# Patient Record
Sex: Female | Born: 1935 | Race: Black or African American | Hispanic: No | Smoking: Never smoker
Health system: Southern US, Community
[De-identification: ages and names within clinical notes are randomized; demographics above are authoritative.]

## PROBLEM LIST (undated history)

## (undated) DIAGNOSIS — I480 Paroxysmal atrial fibrillation: Secondary | ICD-10-CM

## (undated) DIAGNOSIS — I10 Essential (primary) hypertension: Secondary | ICD-10-CM

## (undated) DIAGNOSIS — M1990 Unspecified osteoarthritis, unspecified site: Secondary | ICD-10-CM

## (undated) DIAGNOSIS — E7800 Pure hypercholesterolemia, unspecified: Secondary | ICD-10-CM

## (undated) DIAGNOSIS — I441 Atrioventricular block, second degree: Secondary | ICD-10-CM

## (undated) DIAGNOSIS — R55 Syncope and collapse: Secondary | ICD-10-CM

## (undated) DIAGNOSIS — Z90710 Acquired absence of both cervix and uterus: Secondary | ICD-10-CM

## (undated) HISTORY — DX: Atrioventricular block, second degree: I44.1

## (undated) HISTORY — DX: Acquired absence of both cervix and uterus: Z90.710

## (undated) HISTORY — DX: Essential (primary) hypertension: I10

## (undated) HISTORY — DX: Paroxysmal atrial fibrillation: I48.0

## (undated) HISTORY — PX: ABDOMINAL HYSTERECTOMY: SHX81

## (undated) HISTORY — DX: Syncope and collapse: R55

## (undated) HISTORY — PX: REPLACEMENT TOTAL KNEE: SUR1224

---

## 1997-10-19 ENCOUNTER — Encounter: Admission: RE | Admit: 1997-10-19 | Discharge: 1997-10-19 | Payer: Self-pay | Admitting: *Deleted

## 1997-10-27 ENCOUNTER — Encounter: Admission: RE | Admit: 1997-10-27 | Discharge: 1997-10-27 | Payer: Self-pay | Admitting: *Deleted

## 1999-09-11 ENCOUNTER — Emergency Department (HOSPITAL_COMMUNITY): Admission: EM | Admit: 1999-09-11 | Discharge: 1999-09-11 | Payer: Self-pay | Admitting: Emergency Medicine

## 1999-10-17 ENCOUNTER — Encounter (HOSPITAL_BASED_OUTPATIENT_CLINIC_OR_DEPARTMENT_OTHER): Payer: Self-pay | Admitting: Internal Medicine

## 1999-10-17 ENCOUNTER — Encounter: Admission: RE | Admit: 1999-10-17 | Discharge: 1999-10-17 | Payer: Self-pay | Admitting: Internal Medicine

## 2000-06-24 ENCOUNTER — Emergency Department (HOSPITAL_COMMUNITY): Admission: EM | Admit: 2000-06-24 | Discharge: 2000-06-24 | Payer: Self-pay | Admitting: *Deleted

## 2000-10-20 ENCOUNTER — Encounter (HOSPITAL_BASED_OUTPATIENT_CLINIC_OR_DEPARTMENT_OTHER): Payer: Self-pay | Admitting: Internal Medicine

## 2000-10-20 ENCOUNTER — Encounter: Admission: RE | Admit: 2000-10-20 | Discharge: 2000-10-20 | Payer: Self-pay | Admitting: Internal Medicine

## 2000-10-23 ENCOUNTER — Emergency Department (HOSPITAL_COMMUNITY): Admission: EM | Admit: 2000-10-23 | Discharge: 2000-10-23 | Payer: Self-pay | Admitting: Emergency Medicine

## 2000-10-24 ENCOUNTER — Encounter: Payer: Self-pay | Admitting: Emergency Medicine

## 2001-03-17 ENCOUNTER — Emergency Department (HOSPITAL_COMMUNITY): Admission: EM | Admit: 2001-03-17 | Discharge: 2001-03-17 | Payer: Self-pay | Admitting: Emergency Medicine

## 2001-10-09 ENCOUNTER — Encounter: Admission: RE | Admit: 2001-10-09 | Discharge: 2001-10-09 | Payer: Self-pay | Admitting: Internal Medicine

## 2001-10-09 ENCOUNTER — Encounter (HOSPITAL_BASED_OUTPATIENT_CLINIC_OR_DEPARTMENT_OTHER): Payer: Self-pay | Admitting: Internal Medicine

## 2001-10-13 ENCOUNTER — Encounter: Admission: RE | Admit: 2001-10-13 | Discharge: 2001-10-13 | Payer: Self-pay | Admitting: Internal Medicine

## 2001-10-13 ENCOUNTER — Encounter (HOSPITAL_BASED_OUTPATIENT_CLINIC_OR_DEPARTMENT_OTHER): Payer: Self-pay | Admitting: Internal Medicine

## 2002-11-16 ENCOUNTER — Encounter (HOSPITAL_BASED_OUTPATIENT_CLINIC_OR_DEPARTMENT_OTHER): Payer: Self-pay | Admitting: Internal Medicine

## 2002-11-16 ENCOUNTER — Encounter: Admission: RE | Admit: 2002-11-16 | Discharge: 2002-11-16 | Payer: Self-pay | Admitting: Internal Medicine

## 2003-11-25 ENCOUNTER — Encounter: Admission: RE | Admit: 2003-11-25 | Discharge: 2003-11-25 | Payer: Self-pay | Admitting: Internal Medicine

## 2004-09-28 ENCOUNTER — Emergency Department (HOSPITAL_COMMUNITY): Admission: EM | Admit: 2004-09-28 | Discharge: 2004-09-28 | Payer: Self-pay | Admitting: Emergency Medicine

## 2004-12-05 ENCOUNTER — Encounter: Admission: RE | Admit: 2004-12-05 | Discharge: 2004-12-05 | Payer: Self-pay | Admitting: Internal Medicine

## 2005-12-06 ENCOUNTER — Encounter: Admission: RE | Admit: 2005-12-06 | Discharge: 2005-12-06 | Payer: Self-pay | Admitting: Internal Medicine

## 2006-12-09 ENCOUNTER — Encounter: Admission: RE | Admit: 2006-12-09 | Discharge: 2006-12-09 | Payer: Self-pay | Admitting: Internal Medicine

## 2007-03-16 ENCOUNTER — Encounter: Payer: Self-pay | Admitting: Gastroenterology

## 2007-03-16 ENCOUNTER — Inpatient Hospital Stay (HOSPITAL_COMMUNITY): Admission: EM | Admit: 2007-03-16 | Discharge: 2007-03-18 | Payer: Self-pay | Admitting: *Deleted

## 2007-03-16 ENCOUNTER — Ambulatory Visit: Payer: Self-pay | Admitting: Internal Medicine

## 2007-04-14 ENCOUNTER — Ambulatory Visit: Payer: Self-pay | Admitting: Internal Medicine

## 2007-10-16 ENCOUNTER — Ambulatory Visit: Payer: Self-pay | Admitting: Internal Medicine

## 2007-12-07 ENCOUNTER — Ambulatory Visit: Payer: Self-pay | Admitting: Internal Medicine

## 2007-12-10 ENCOUNTER — Encounter: Admission: RE | Admit: 2007-12-10 | Discharge: 2007-12-10 | Payer: Self-pay | Admitting: Internal Medicine

## 2008-07-15 DIAGNOSIS — E785 Hyperlipidemia, unspecified: Secondary | ICD-10-CM

## 2008-07-15 DIAGNOSIS — R55 Syncope and collapse: Secondary | ICD-10-CM | POA: Insufficient documentation

## 2008-07-15 DIAGNOSIS — I119 Hypertensive heart disease without heart failure: Secondary | ICD-10-CM

## 2008-07-18 ENCOUNTER — Ambulatory Visit: Payer: Self-pay | Admitting: Internal Medicine

## 2008-11-02 ENCOUNTER — Ambulatory Visit: Payer: Self-pay

## 2008-11-02 ENCOUNTER — Encounter: Payer: Self-pay | Admitting: Internal Medicine

## 2008-12-12 ENCOUNTER — Encounter: Admission: RE | Admit: 2008-12-12 | Discharge: 2008-12-12 | Payer: Self-pay | Admitting: Internal Medicine

## 2009-05-03 ENCOUNTER — Ambulatory Visit: Payer: Self-pay | Admitting: Internal Medicine

## 2009-08-31 ENCOUNTER — Ambulatory Visit: Payer: Self-pay | Admitting: Internal Medicine

## 2009-12-14 ENCOUNTER — Encounter: Admission: RE | Admit: 2009-12-14 | Discharge: 2009-12-14 | Payer: Self-pay | Admitting: Internal Medicine

## 2010-03-20 ENCOUNTER — Ambulatory Visit
Admission: RE | Admit: 2010-03-20 | Discharge: 2010-03-20 | Payer: Self-pay | Source: Home / Self Care | Attending: Internal Medicine | Admitting: Internal Medicine

## 2010-03-26 ENCOUNTER — Encounter: Payer: Self-pay | Admitting: Internal Medicine

## 2010-03-29 NOTE — Assessment & Plan Note (Signed)
Summary: 6 MONTH/DMP   Visit Type:  Follow-up Primary Provider:  Dr.Patterson  CC:  no compaints-Pt does have acold.  History of Present Illness: [name] returns today for followup of syncope.  She is a pleasant elderly woman with a h/o unexplained syncope who is s/p insertion of an ILR over two years ago.  She has had no recurrent syncope since we saw her last in the office.  She denies c/p or sob.  She admits to dietary indiscretion.  Current Medications (verified): 1)  Aspirin Ec 325 Mg Tbec (Aspirin) .... Take One Tablet By Mouth Daily 2)  Oscal 500/200 D-3 500-200 Mg-Unit Tabs (Calcium-Vitamin D) .... 2  Daily 3)  Multivitamins   Tabs (Multiple Vitamin) .Marland Kitchen.. 1 By Mouth Once Daily 4)  Triamterene-Hctz 37.5-25 Mg Caps (Triamterene-Hctz) .Marland Kitchen.. 1 By Mouth Once Daily 5)  Simvastatin 20 Mg Tabs (Simvastatin) .... Take One Tablet By Mouth Daily At Bedtime  Allergies (verified): No Known Drug Allergies  Past History:  Past Medical History: Last updated: 07/15/2008 DYSLIPIDEMIA (ICD-272.4) HYPERTENSION (ICD-401.9) SYNCOPE (ICD-780.2)  Past Surgical History: Last updated: 07/15/2008  Status post knee surgery.   Status post remote hysterectomy  Implantation of implantable loop recorder.   Review of Systems  The patient denies chest pain, syncope, dyspnea on exertion, and peripheral edema.    Vital Signs:  Patient profile:   77 year old female Height:      62 inches Weight:      196 pounds BMI:     35.98 Pulse rate:   49 / minute BP sitting:   161 / 68  (left arm) Cuff size:   large  Vitals Entered By: Burnett Kanaris, CNA (May 03, 2009 10:43 AM)  Physical Exam  General:  Well developed, well nourished, in no acute distress. Head:  normocephalic and atraumatic Eyes:  PERRLA/EOM intact; conjunctiva and lids normal. Mouth:  Teeth, gums and palate normal. Oral mucosa normal. Neck:  Neck supple, no JVD. No masses, thyromegaly or abnormal cervical nodes. Chest Wall:   Well healed ILR incision with a small keloid. Lungs:  Clear bilaterally to auscultation without wheezes, rales, or rhonchi. Heart:  RRR with a soft S4 gallop.  No murmurs. Abdomen:  Bowel sounds positive; abdomen soft and non-tender without masses, organomegaly, or hernias noted. No hepatosplenomegaly. Msk:  Back normal, normal gait. Muscle strength and tone normal. Pulses:  pulses normal in all 4 extremities Extremities:  No clubbing or cyanosis. Neurologic:  Alert and oriented x 3.   EKG  Procedure date:  05/03/2009  Findings:      Sinus bradycardia with rate of:  49.   ILR Following MD Robin Bunting, MD DOI:  03/17/2007 Vendor:  [employer] Jude     Model Number:  [id number]     Serial Number [id number]        Impression & Recommendations:  Problem # 1:  SYNCOPE (ICD-780.2) The etiology is still unclear.  Her loop recorder today demonstrates heart rates in the 30's during the day but she has had no symptoms. I have asked that she pay close attention to her symptoms and let us know if she develops symptomatic bradycardia. Her updated medication list for this problem includes:    Aspirin Ec 325 Mg Tbec (Aspirin) .Marland Kitchen... Take one tablet by mouth daily  Problem # 2:  HYPERTENSION (ICD-401.9) the patient's blood pressure is elevated today but she states that she takes it at home and that is has been well controlled.  I have  asked her to decrease the salt in her diet and to lose weight by eating less. She will continue her meds as below. Her updated medication list for this problem includes:    Aspirin Ec 325 Mg Tbec (Aspirin) .Marland Kitchen... Take one tablet by mouth daily    Triamterene-hctz 37.5-25 Mg Caps (Triamterene-hctz) .Marland Kitchen... 1 by mouth once daily  Patient Instructions: 1)  Your physician recommends that you schedule a follow-up appointment in: 6 MONTHS

## 2010-03-29 NOTE — Cardiovascular Report (Signed)
Summary: Office Visit   Office Visit   Imported By: Roderic Ovens 09/05/2009 09:54:34  _____________________________________________________________________  External Attachment:    Type:   Image     Comment:   External Document

## 2010-03-29 NOTE — Assessment & Plan Note (Signed)
Summary: 6 month return.amber   Visit Type:  Follow-up   Current Medications (verified): 1)  Aspirin Ec 325 Mg Tbec (Aspirin) .... Take One Tablet By Mouth Daily 2)  Oscal 500/200 D-3 500-200 Mg-Unit Tabs (Calcium-Vitamin D) .... 2  Daily 3)  Multivitamins   Tabs (Multiple Vitamin) .Marland Kitchen.. 1 By Mouth Once Daily 4)  Triamterene-Hctz 37.5-25 Mg Caps (Triamterene-Hctz) .Marland Kitchen.. 1 By Mouth Once Daily 5)  Simvastatin 20 Mg Tabs (Simvastatin) .... Take One Tablet By Mouth Daily At Bedtime 6)  Lisinopril-Hydrochlorothiazide 10-12.5 Mg Tabs (Lisinopril-Hydrochlorothiazide) .... Take One Tablet By Mouth Once Daily.  Allergies (verified): No Known Drug Allergies  Vital Signs:  Patient profile:   77 year old female Height:      62 inches Weight:      196 pounds BMI:     35.98 Pulse rate:   44 / minute BP sitting:   104 / 62  (left arm)  Vitals Entered By: Laurance Flatten CMA (March 20, 2010 4:53 PM)    ILR Following MD Lewayne Bunting, MD DOI:  03/17/2007 Vendor:  [employer] Jude     Model Number:  [id number]     Serial Number [id number]       Tachy Episodes:  4     Brady Episodes:  22 ILR Next Due 05/27/2010  Tech Comments:  All tachy episodes were noise, the asystole episodes were signal drop out.  The brady episodes were true episodes with the longes lasting 2:44 minutes. The patient has been asymptomatic.  ROV 3 months clinic. Altha Harm, LPN  March 21, 2010 2:02 PM   Appended Document: 6 month return.amber Mrs. [name] returns today for ILR followup.  She is a pleasant elderly woman with a h/o HTN and bradycardia who is s/p ILR. She denies c/p, sob, or peripheral edema. PE - well appearing elderly woman NAD BP 104/62. P 44 R 16 CV RRR Lungs clear EXT No edema. ILR interogation as noted in previous entry A/P   1. Syncope - She has not had any recurrent symptoms despite her prior history of syncope. Will follow.   2. HTN - Her blood pressure is well controlled. Will follow. A low sodium diet is  recommended.

## 2010-03-29 NOTE — Assessment & Plan Note (Signed)
Summary: 3 month rov/sl   Visit Type:  Follow-up Primary Provider:  Dr.Patterson   History of Present Illness: [name] returns today for followup of syncope.  She is a pleasant elderly woman with a h/o unexplained syncope who is s/p insertion of an ILR over two years ago.  She has had no recurrent syncope since we saw her last in the office.  She denies c/p or sob.  She admits to dietary indiscretion.  Current Medications (verified): 1)  Aspirin Ec 325 Mg Tbec (Aspirin) .... Take One Tablet By Mouth Daily 2)  Oscal 500/200 D-3 500-200 Mg-Unit Tabs (Calcium-Vitamin D) .... 2  Daily 3)  Multivitamins   Tabs (Multiple Vitamin) .Marland Kitchen.. 1 By Mouth Once Daily 4)  Triamterene-Hctz 37.5-25 Mg Caps (Triamterene-Hctz) .Marland Kitchen.. 1 By Mouth Once Daily 5)  Simvastatin 20 Mg Tabs (Simvastatin) .... Take One Tablet By Mouth Daily At Bedtime 6)  Lisinopril-Hydrochlorothiazide 10-12.5 Mg Tabs (Lisinopril-Hydrochlorothiazide) .... Take One Tablet By Mouth Once Daily.  Allergies (verified): No Known Drug Allergies  Past History:  Past Medical History: Last updated: 07/15/2008 DYSLIPIDEMIA (ICD-272.4) HYPERTENSION (ICD-401.9) SYNCOPE (ICD-780.2)  Review of Systems  The patient denies chest pain, syncope, dyspnea on exertion, and peripheral edema.    Vital Signs:  Patient profile:   77 year old female Height:      62 inches Weight:      192 pounds BMI:     35.24 Pulse rate:   51 / minute BP sitting:   162 / 64  (left arm)  Vitals Entered By: Laurance Flatten CMA (August 31, 2009 11:03 AM)  Physical Exam  General:  Well developed, well nourished, in no acute distress. Head:  normocephalic and atraumatic Mouth:  Teeth, gums and palate normal. Oral mucosa normal. Neck:  Neck supple, no JVD. No masses, thyromegaly or abnormal cervical nodes. Chest Wall:  Well healed ILR incision with a small keloid. Lungs:  Clear bilaterally to auscultation without wheezes, rales, or rhonchi. Heart:  RRR with a soft  S4 gallop.  No murmurs. Abdomen:  Bowel sounds positive; abdomen soft and non-tender without masses, organomegaly, or hernias noted. No hepatosplenomegaly. Msk:  Back normal, normal gait. Muscle strength and tone normal. Pulses:  pulses normal in all 4 extremities Extremities:  No clubbing or cyanosis. Neurologic:  Alert and oriented x 3.    ILR Following MD Lewayne Bunting, MD DOI:  03/17/2007 Vendor:  [employer] Jude     Model Number:  [id number]     Serial Number [id number]       Tachy Episodes:  1     Brady Episodes:  72 ILR Next Due 02/26/2010  Tech Comments:  Asymptomatic bradycardia.  Tachy episode is noise.  ROV 6 months clinic. Gypsy Balsam RN BSN  August 31, 2009 11:22 AM   MD Comments:  Agree with above.  Impression & Recommendations:  Problem # 1:  SYNCOPE (ICD-780.2) She has had no recurrent episodes since we last saw her.  She has had bradycardia of unclear clinical significance. Her updated medication list for this problem includes:    Aspirin Ec 325 Mg Tbec (Aspirin) .Marland Kitchen... Take one tablet by mouth daily    Lisinopril-hydrochlorothiazide 10-12.5 Mg Tabs (Lisinopril-hydrochlorothiazide) .Marland Kitchen... Take one tablet by mouth once daily.  Problem # 2:  HYPERTENSION (ICD-401.9) A low sodium diet is recommended.  Continue meds as below. Her updated medication list for this problem includes:    Aspirin Ec 325 Mg Tbec (Aspirin) .Marland Kitchen... Take one tablet by mouth  daily    Triamterene-hctz 37.5-25 Mg Caps (Triamterene-hctz) .Marland Kitchen... 1 by mouth once daily    Lisinopril-hydrochlorothiazide 10-12.5 Mg Tabs (Lisinopril-hydrochlorothiazide) .Marland Kitchen... Take one tablet by mouth once daily.  Patient Instructions: 1)  Your physician wants you to follow-up in:  6 months with Dr Ladona Ridgel. You will receive a reminder letter in the mail two months in advance. If you don't receive a letter, please call our office to schedule the follow-up appointment.

## 2010-07-10 NOTE — Assessment & Plan Note (Signed)
 HEALTHCARE                         ELECTROPHYSIOLOGY OFFICE NOTE   NAME:[name], [name]                    MRN:          [id number]  DATE:10/16/2007                            DOB:          1926-02-28    [name] returns today for followup.  She is a very pleasant 77-year-  old woman with a history of unexplained syncope and hypertension, who  has had no recurrent episodes of syncope since her loop recorder was  placed back in January.  She denies chest pain.  She denies shortness of  breath today.  She admits to being a little heavy.  She also complains  of soreness at her loop recorder insertion site and is now like a small  keloid, that is present.   CURRENT MEDICATIONS:  1. Aspirin 325 a day.  2. Vitamins.  3. Triamterene 25 mg half tablet daily.  4. Simvastatin 20 mg daily.   PHYSICAL EXAMINATION:  GENERAL:  On physical exam, she is pleasant, well-  appearing woman in no distress.  VITAL SIGNS:  Blood pressure was 187/86, the pulse 77 and regular,  respirations were 18.  Weight was 195 pounds.  NECK:  No jugular venous distention.  LUNGS:  Clear bilaterally to auscultation.  No wheezes, rales, or  rhonchi are present.  CARDIOVASCULAR:  Regular rate and rhythm.  Normal S1 and S2.  There are  no murmurs, rubs, or gallops.  The loop recorder insertion site was  healed nicely with a small keloid.  EXTREMITIES:  Demonstrate no edema.   Interrogation of loop recorder demonstrates no significant tachy or  brady episodes.  EKG demonstrates sinus rhythm with sinus arrhythmia.   IMPRESSION:  1. Unexplained syncope.  2. Hypertension.  3. Status post insertion of implantable loop recorder.   DISCUSSION:  [name] is stable.  Her loop recorder is working  normally, but there is no clear evidence of etiology of her syncope.  We  will plan on seeing her back in 9 months.     Doylene Canning. Ladona Ridgel, MD  Electronically Signed    GWT/MedQ   DD: 10/16/2007  DT: 10/17/2007  Job #: [id number]   cc:   Barry Dienes. Eloise Harman, M.D.

## 2010-07-10 NOTE — H&P (Signed)
[name], [name]             ACCOUNT NO.:  [id number]   MEDICAL RECORD NO.:  [id number]          PATIENT TYPE:  EMS   LOCATION:  [facility]                         FACILITY:  [facility]   PHYSICIAN:  Larina Earthly, M.D.        DATE OF BIRTH:  August 25, 1926   DATE OF ADMISSION:  03/15/2007  DATE OF DISCHARGE:                              HISTORY & PHYSICAL   CHIEF COMPLAINT:  Almost blacked out with fall.   HISTORY OF PRESENT ILLNESS:  This is a 77 year old African American  female with a history of hypertension, degenerative joint disease and  hyperlipidemia, who presents with near syncope at approximately 6 p.m.  while at a friend's house.  At the time, she was sitting on a stool and  felt poorly for few seconds to possibly a minute and then slumped over  and hit the floor with her backside.  She does not remember falling, but  does remember hitting the floor at the time.  Surrounding members of her  family thought that she did have some mild tremors, but the patient that  was lucid, answering all questions appropriately and was complaining of  feeling cold.  She had no altered mental status, no focal neurological  deficits, no chest pain, no shortness of breath, no nausea or vomiting  and no precipitating symptoms of no warning, except for the few seconds  prior to the event, she did that complain of some sweats at that time.  She had no bowel or bladder incontinence and no recent bathroom visits  consistent with a vagal episode.  She has had a history of a similar  incident occurring in October 2008 while standing for prolonged periods  of time at a store; EMS was called at that time, but given her normal  vital signs and her nonfocal neurological deficit, she deferred any  further evaluation or management.  Specifically, she had no hypotension.  In the emergency room here, her blood pressure was super-normal with a  systolic blood pressure ranging from 165-185.  EKG reveals sinus rhythm  with a first-degree AV block.  Initial set of cardiac enzymes are  normal.  Exam was benign.  Chest x-ray was unremarkable for acute  disease and the patient is now subsequently admitted for further  evaluation and management of her near-syncopal episode.   REVIEW OF SYSTEMS:  As above.   ALLERGIES:  NO KNOWN DRUG ALLERGIES.   MEDICATIONS:  1. Atenolol 25 mg each day.  2. A statin of unclear name and dosage.  3. Aspirin each day.  4. Os-Cal b.i.d. with vitamin D.   SOCIAL HISTORY:  The patient is a widow, is retired from dietary at  [employer], denies any tobacco or alcohol use.   FAMILY HISTORY:  Significant for diabetes, but no history of early heart  disease, stroke or cancer.   PAST MEDICAL HISTORY:  Significant for:  1. Hypertension.  2. History of knee surgery.  3. Hyperlipidemia.  4. History of remote hysterectomy.   LABORATORY EVALUATION:  Sodium 136, potassium 4.1, BUN 12, creatinine  0.9, glucose 92.  White  blood cell count 5.7, hemoglobin 11.6,  hematocrit 35.3%, platelet count 178,000.  CK-MB less than 1, troponin I  less than 0.05.   CHEST X-RAY:  Reveals cardiomegaly, no edema, bilateral atelectasis at  the bases.   PHYSICAL EXAM:  GENERAL:  We have a pleasant African American female  lying flat in bed in no apparent distress, answering all questions  appropriately, alert and oriented x3.  VITAL SIGNS:  Blood pressure is 188/85, heart rate 56, respirations are  16 nonlabored, temperature 97.3 degrees Fahrenheit, oxygen saturation  100% on room air.  HEENT:  Sclerae are anicteric.  Extraocular movements are intact.  Face  is symmetric.  There are no oropharyngeal lesions.  Tongue is midline.  NECK:  Supple.  There are no carotid bruits.  There is no cervical  lymphadenopathy.  LUNGS:  Clear to auscultation bilaterally.  CARDIOVASCULAR:  Exam reveals a regular rate and rhythm without murmurs,  rubs or gallops.  LYMPHATICS:  There is no cervical or  axillary lymphadenopathy.  ABDOMEN:  Soft, nontender and non-distended abdomen.  Bowel sounds are  present.  EXTREMITIES:  Exam reveals no edema.  Pedal pulses are intact.  NEUROLOGICAL:  Exam is grossly nonfocal.   ASSESSMENT AND PLAN:  1. Near syncope with fall.  Exam, initial laboratories and EKG are all      unremarkable except for first-degree atrioventricular block on      current low-dose beta-blocker.  History is not consistent with      seizure or focal neurological event, especially in light of the      brevity for her symptoms and the lack of any postictal      symptomatology.  Also doubt a vagal episode; however, this cannot      be entirely ruled out.  Our plan will be to admit the patient, rule      out for myocardial infarction, monitor on telemetry, discontinue      beta blocker and consider RAAS agent and/or calcium channel      blocker; however, the patient states that she has been on another      agents that failed to work and may have indeed caused side-effects.      We will need to have Dr. Eloise Harman follow up on this matter.  2. Hyperlipidemia.  We will continue home dose of statin, once this is      elucidated.  3. I will provide deep venous thrombosis prophylaxis as well as      aspirin therapy and continue home doses of Os-Cal with vitamin D.      Larina Earthly, M.D.  Electronically Signed     RA/MEDQ  D:  03/16/2007  T:  03/16/2007  Job:  [id number]   cc:   Barry Dienes. Eloise Harman, M.D.

## 2010-07-10 NOTE — Op Note (Signed)
[name], [name]             ACCOUNT NO.:  [id number]   MEDICAL RECORD NO.:  [id number]          PATIENT TYPE:  INP   LOCATION:  3729                         FACILITY:  [facility]   PHYSICIAN:  Doylene Canning. Ladona Ridgel, MD    DATE OF BIRTH:  Jul 13, 1926   DATE OF PROCEDURE:  03/17/2007  DATE OF DISCHARGE:                               OPERATIVE REPORT   PROCEDURE PERFORMED:  Implantation of implantable loop recorder.   INDICATIONS:  Unexplained syncope.   1. The patient is a 77 year old woman who was admitted to the hospital      with an episode of syncope of unexplained etiology.  She is now      referred for insertion of an implantable loop recorder.   1. Procedure:  After informed consent was obtained, the patient was      taken to the diagnostic catheterization lab in a fasting state.      After the usual preparation and draping, intravenous fentanyl and      midazolam was given for sedation.  30 mL of lidocaine was      infiltrated in the left pectoral region.  A 3 cm incision was      carried out over this region.  Electrocautery was utilized to      dissect down to the fascial plane.  The subcutaneous pocket was      then made with electrocautery.  Kanamycin irrigation was utilized      to irrigate the pocket.  Electrocautery was utilized to assure      hemostasis.  The [employer] Jude confirm implantable loop recorder, serial      number [id number] was placed in the subcutaneous pocket and secured      with two silk sutures.  The pocket was then irrigated with      kanamycin and the incision closed with a layer of 2-0 Vicryl      followed by a layer of 3-0 Vicryl.  Benzoin was painted on the      skin.  Steri-Strips were applied and a pressure dressing was placed      and the patient was returned to her room in satisfactory condition.   1. Complications:  There were no major procedure complications.   1. Results:  This demonstrate successful implantation of a [employer]. Jude      implantable loop  recorder without immediate procedure complication.      Doylene Canning. Ladona Ridgel, MD  Electronically Signed     GWT/MEDQ  D:  03/17/2007  T:  03/18/2007  Job:  [id number]   cc:   Larina Earthly, M.D.

## 2010-07-10 NOTE — Consult Note (Signed)
NAMEDHANA, TOTTON             ACCOUNT NO.:  [id number]   MEDICAL RECORD NO.:  [id number]          PATIENT TYPE:  INP   LOCATION:  3729                         FACILITY:  [facility]   PHYSICIAN:  Duke Salvia, MD, FACCDATE OF BIRTH:  01/20/1927   DATE OF CONSULTATION:  03/16/2007  DATE OF DISCHARGE:                                 CONSULTATION   Thank you very much for asking [place] to see [name] in consultation  because of recurrent syncope.   She is a 77 year old lady evaluated by Cardiology in 2002 with an echo  that was then normal for episode of syncope.  That occurred while at  [facility] working.  She had gone to the bathroom and had urinated, had  gotten up and was washing her hands when she became ill in a sort of  nondescript way.  She and her colleague were able to get out of the  bathroom and sit down in a chair where the symptoms continued to  progress.  Ultimately, she was taken to the emergency room where she  recalls only that she was told that her blood pressure was low.  She was  told, after the cardiac evaluation, that there was nothing wrong with  her heart.   About 3 months ago, she had another episode of syncope.  She was  standing in line at a store.  She became presyncopal and then found  herself waking on the floor.  She was quite diaphoretic.  EMS was  called.  Her recollection of their report was that her vital signs were  normal and she was not transported to hospital.  She does recall being  diaphoretic.  She also suggests that the prodromata of these two  episodes were distinct from each other.   Yesterday she had another episode of syncope.  She was sitting on a  stool.  She felt sick.  This time it was similar to the sensation in  October.  She apparently told somebody that she was not feeling well,  though nobody recalls her having said that and the next thing she  awakened on the floor having fallen off the stool.  She was out,  according  to the report of her family, for less than about 10 seconds.  She sat up.  She had no confusion.  She did not stand up.  EMS was  called and, on their arrival, the heart rate was a little low but her  blood pressure was normal.  There was some associated flushing and  diaphoresis.  There was no nausea.   More remotely she had syncope as a child.  These episodes typically  occurred with her period.  As best as she can recall, the prodromal  symptoms were different from these episodes.   She has a history of hypertension.  She does not have any diabetes.  She  has had no problems with palpitations.   She does not have orthostatic intolerance and she has no cardiac history  and no exertional symptoms.   PAST MEDICAL HISTORY:  In addition to the above, is notable  for:  1. Dyslipidemia.  2. Knee surgery.  3. Remote hysterectomy.   SOCIAL HISTORY:  She is widowed.  She is retired from Audiological scientist at [employer].  She has children who are involved in her care.   FAMILY HISTORY:  Noncontributory.   MEDICATIONS:  On arrival included atenolol 25, an unknown statin, and  aspirin.   She has NO KNOWN DRUG ALLERGIES.   REVIEW OF SYSTEMS:  Noncontributory over multiple organ systems.   LABORATORIES ON EVALUATION:  Notable for hemoglobin 11.6.  She was  subsequently ruled out for myocardial infarction.   ON EXAMINATION:  She is an elderly African American female appearing her  stated age of 77 or to some degree perhaps less than that.  Her blood  pressure this morning was 140/65 and orthostatics have not been  obtained, her pulse was 58.  HEENT EXAM:  Demonstrated no icterus or xanthoma.  NECK:  Veins were flat.  The carotids were brisk and full bilaterally  without bruits.  BACK:  Was without kyphosis or scoliosis.  LUNGS:  Were clear.  HEART:  Sounds were regular with an S4.  There were no significant  murmurs.  ABDOMEN:  Soft with active bowel sounds without midline pulsation or   hepatomegaly.  Femoral pulses were not examined.  Distal pulses were intact.  There was  no clubbing, cyanosis or edema.  NEUROLOGICAL EXAM:  Was grossly normal.  SKIN:  Was warm and dry.   Electrocardiogram dated yesterday demonstrated sinus rhythm at 50 with  intervals of 0.22/0.08/0.45.  Electrocardiogram from April 2002 was  similar apart from the fact that her heart rate was 70 and the PR  interval was normal.   Telemetry demonstrates only sinus bradycardia with rates in the low 40s,  occurring mostly at night.   IMPRESSION:  1. Recurrent syncope, both as a child and now three episodes in the      last year or so (question date of the first episode) with a very      brief prodrome and a very brief recovery.  2. Normal QRS duration but with PR prolongation and sinus bradycardia.  3. Hypertension.   DISCUSSION:  Ms. Fay has recurrent syncope that is abrupt in onset  and offset to brevity.  Would suggest that this is an arrhythmic  episode.  The electrocardiogram raises the possibility about sinus  bradycardia or sinus node dysfunction as potential mechanism.  The  longstanding nature going back to her childhood raises the possibility  of a neurally mediated episode and this differential will be the most  likely one.   It is incumbent upon [place] to exclude left ventricular dysfunction and thus  the potential for life-threatening ventricular arrhythmias.   Further data would suggest that the use of an implantable loop recorder  might be the more effective diagnostic tool compared to tilt table  testing and outpatient monitoring and I have reviewed this with the  patient.   RECOMMENDATIONS:  Based on the above, we will, therefore:  1. Obtain an echo and I have contacted Dr. Elvis Coil office to read it.  2. Consider loop recorder versus tilt table testing, assuming her echo      is normal for tomorrow.   Thank you for the consultation.      Duke Salvia, MD, [facility]   Electronically Signed     SCK/MEDQ  D:  03/16/2007  T:  03/16/2007  Job:  [id number]   cc:   Barry Dienes. Eloise Harman,  M.D.  Peter M. [place], M.D.

## 2010-07-10 NOTE — Discharge Summary (Signed)
NAMERASEEL, JANS             ACCOUNT NO.:  [id number]   MEDICAL RECORD NO.:  [id number]          PATIENT TYPE:  INP   LOCATION:  3729                         FACILITY:  [facility]   PHYSICIAN:  Barry Dienes. Eloise Harman, M.D.DATE OF BIRTH:  1926/10/19   DATE OF ADMISSION:  03/15/2007  DATE OF DISCHARGE:  03/18/2007                               DISCHARGE SUMMARY   PERTINENT FINDINGS:  The patient is a 77 year old African American woman  who presented to the hospital with recurrent syncope.  She was first  seen by a cardiologist in 2002 for an episode of syncope.  At that time  she had a normal echocardiogram.  About 3 months prior to admission she  had another episode of syncope with little warning.  On the day prior to  admission, she had episode of syncope while she was sitting on a stool.  EMS personnel noted that her heart rate was a little low (records not  available).  With her episode there was some flushing and diaphoresis,  but no nausea.   PAST MEDICAL HISTORY:  1. Hypertension.  2. Dyslipidemia.  3. Status post knee surgery.  4. Status post remote hysterectomy.   INITIAL PHYSICAL EXAM:  VITAL SIGNS:  Blood pressure 140/65, pulse 58,  respirations 20, temperature 98.2.  GENERAL:  She is an elderly African American woman who was in no  apparent distress.  HEAD, EYES, EARS, NOSE, AND THROAT EXAM:  Within normal limits.  NECK:  Supple without jugular venous distention or carotid bruit.  CHEST: Clear to auscultation.  HEART:  Regular rate and rhythm without significant murmur or gallop.  ABDOMEN:  Benign.  EXTREMITIES:  Without cyanosis, clubbing, or edema.  NEUROLOGICAL EXAM:  Nonfocal.   EKG showed sinus bradycardia at a rate of 50 with first-degree AV block.   HOSPITAL COURSE:  The patient was admitted to a medical bed with  telemetry.  Her telemetry showed only sinus bradycardia with rates in  the low 40s, mostly at night.  She had an echocardiogram that showed  normal  left ventricular systolic function and normal wall motion in the  left ventricle.  She was seen by a Cardiology consultant, Dr. Graciela Husbands, who  recommended implantable cardiac recorder insertion if her echocardiogram  was normal.  On January 20th, she had a loop recorder inserted ([facility]  Confirm from [facility]) without any complications.   COMPLICATIONS:  None.   CONDITION ON DISCHARGE:  GENERAL:  She was alert and not lightheaded and  had not had recent palpitations.  VITAL SIGNS:  Most recent vital signs include blood pressure 151/63,  pulse 72, respirations 20, temperature 97.6.  CHEST:  Clear to auscultation.  HEART:  Regular rate and rhythm and was without significant murmur or  gallop.  ABDOMEN:  Benign.  NEUROLOGICAL EXAM:  Nonfocal.  She was able to change from a sitting  position to a standing position and walk a short distance independently.   DISCHARGE DIAGNOSES:  1. Recurrent syncope.  2. Sinus bradycardia with first-degree atrioventricular block.  3. Hyperlipidemia.  4. Hypertension.  5. Osteopenia.  6. Osteoarthritis.  DISCHARGE MEDICATIONS:  1. Maxzide 25 mg 1/2 tablet p.o. q.a.m.  2. Cholesterol drug once daily,  3. Ecotrin 325 mg p.o. daily to be restarted on January 27th.  4. Os-Cal D 500 mg twice daily.  5. Tylenol 500 mg p.o. t.i.d. p.r.n. pain.   DISPOSITION AND FOLLOWUP:  She should have a followup appointment with  Dr. [name] in approximately 1-to-2 weeks following discharge.  She should be seen at the [facility] on [address].  Dr. Lubertha Basque office was planning to call her with a followup  appointment.           ______________________________  Barry Dienes. Eloise Harman, M.D.     DGP/MEDQ  D:  04/14/2007  T:  04/15/2007  Job:  [id number]   cc:   Duke Salvia, MD, [facility]  Peter M. [place], M.D.

## 2010-07-10 NOTE — Assessment & Plan Note (Signed)
[facility]                         ELECTROPHYSIOLOGY OFFICE NOTE   NAME:[name], Robin POSTEL                    MRN:          [id number]  DATE:04/14/2007                            DOB:          04-25-1926    [name] returns today for follow up of her blood pressure and  syncope.  She is a very pleasant, elderly woman with a history of both  problems who returns today for follow up.  She underwent loop recorder  insertion several weeks ago.  She had no specific complaints and has not  had any syncopal episodes since discharge from the hospital.  She notes  that at home her blood pressure tends to be in the 120-130 range.  Today, it is much increased.  She has no other specific complaints.   PHYSICAL EXAMINATION:  GENERAL:  She is a pleasant elderly woman in no  acute distress.  VITAL SIGNS:  The blood pressure was 190/89 (repeat by me was 160/90),  pulse was 84, the respirations were 18.  The weight was 194 pounds.  NECK:  No jugular venous distention.  LUNGS:  Clear bilaterally to auscultation.  No wheezes, rales or rhonchi  are present.  CARDIOVASCULAR:  A regular rate and rhythm with normal S1 and S2.  EXTREMITIES:  No edema.   MEDICATIONS:  1. Aspirin 325 a day.  2. Os-Cal.  3. Multiple vitamins.  4. Triamterene 25 mg half a tablet daily.  5. Simvastatin 20 a day.   Interrogation of her loop recorder demonstrates no actual episodes.  She  had several episodes classified as asystole which were all drop bouts.  There are no other arrhythmias noted.   IMPRESSION:  1. Unexplained syncope status post loop recorder insertion.  2. Hypertension, poorly controlled.   DISCUSSION:  [name] is stable today.  A repeat blood pressure is  better.  I have asked that she check it on a regular basis and call us  if the systolic blood pressures remain above 140, in  which case we will try additional antihypertensive medicines for her.  I  will see  her back in 6 months or sooner should she have recurrent  syncope.     Doylene Canning. Ladona Ridgel, MD  Electronically Signed    GWT/MedQ  DD: 04/14/2007  DT: 04/15/2007  Job #: [id number]   cc:   Barry Dienes. Eloise Harman, M.D.  Peter M. [place], M.D.

## 2010-08-01 ENCOUNTER — Ambulatory Visit (INDEPENDENT_AMBULATORY_CARE_PROVIDER_SITE_OTHER): Payer: Medicare Other | Admitting: *Deleted

## 2010-08-01 DIAGNOSIS — R55 Syncope and collapse: Secondary | ICD-10-CM

## 2010-11-05 ENCOUNTER — Ambulatory Visit (INDEPENDENT_AMBULATORY_CARE_PROVIDER_SITE_OTHER): Payer: Medicare Other | Admitting: *Deleted

## 2010-11-05 ENCOUNTER — Encounter: Payer: Self-pay | Admitting: Internal Medicine

## 2010-11-05 DIAGNOSIS — R55 Syncope and collapse: Secondary | ICD-10-CM

## 2010-11-05 LAB — PACEMAKER DEVICE OBSERVATION: DEVICE MODEL PM: 2018093

## 2010-11-05 NOTE — Progress Notes (Signed)
Loop recorder check  

## 2010-11-16 ENCOUNTER — Other Ambulatory Visit: Payer: Self-pay | Admitting: Internal Medicine

## 2010-11-16 DIAGNOSIS — Z1231 Encounter for screening mammogram for malignant neoplasm of breast: Secondary | ICD-10-CM

## 2010-11-16 LAB — DIFFERENTIAL
Eosinophils Absolute: 0.1
Eosinophils Relative: 1
Lymphocytes Relative: 22
Lymphs Abs: 1.2
Monocytes Relative: 9

## 2010-11-16 LAB — I-STAT 8, (EC8 V) (CONVERTED LAB)
Acid-Base Excess: 2
Chloride: 105
Hemoglobin: 14.3
Potassium: 4.1
Sodium: 136
TCO2: 26

## 2010-11-16 LAB — BASIC METABOLIC PANEL
BUN: 8
CO2: 31
Chloride: 103
Creatinine, Ser: 0.77
Glucose, Bld: 91
Potassium: 3.9

## 2010-11-16 LAB — CBC
HCT: 35.3 — ABNORMAL LOW
HCT: 35.6 — ABNORMAL LOW
Hemoglobin: 11.8 — ABNORMAL LOW
MCV: 89
Platelets: 172
Platelets: 178
RBC: 3.96
RBC: 4.01
WBC: 5
WBC: 5.7

## 2010-11-16 LAB — CARDIAC PANEL(CRET KIN+CKTOT+MB+TROPI)
CK, MB: 0.7
CK, MB: 0.8
Relative Index: INVALID
Total CK: 75
Total CK: 89
Troponin I: 0.02

## 2010-11-16 LAB — POCT I-STAT CREATININE
Creatinine, Ser: 0.9
Operator id: 151321

## 2010-11-16 LAB — POCT CARDIAC MARKERS: Operator id: 151321

## 2010-12-17 ENCOUNTER — Ambulatory Visit
Admission: RE | Admit: 2010-12-17 | Discharge: 2010-12-17 | Disposition: A | Discharge status: Home / Self Care | Payer: Medicare Other | Source: Ambulatory Visit | Attending: Internal Medicine | Admitting: Internal Medicine

## 2010-12-17 DIAGNOSIS — Z1231 Encounter for screening mammogram for malignant neoplasm of breast: Secondary | ICD-10-CM

## 2011-02-05 ENCOUNTER — Ambulatory Visit (INDEPENDENT_AMBULATORY_CARE_PROVIDER_SITE_OTHER): Payer: Medicare Other | Admitting: Internal Medicine

## 2011-02-05 ENCOUNTER — Other Ambulatory Visit: Payer: Self-pay | Admitting: Internal Medicine

## 2011-02-05 ENCOUNTER — Encounter: Payer: Self-pay | Admitting: Internal Medicine

## 2011-02-05 DIAGNOSIS — R55 Syncope and collapse: Secondary | ICD-10-CM

## 2011-02-05 DIAGNOSIS — I10 Essential (primary) hypertension: Secondary | ICD-10-CM

## 2011-02-05 NOTE — Assessment & Plan Note (Signed)
Her blood pressure is well controlled. When I took it it was 118/62. She will continue a low-sodium diet and her current medical therapy.

## 2011-02-05 NOTE — Progress Notes (Signed)
HPI Mrs. Scheib returns today for followup. She is a very pleasant 77 year old woman with a history of unexplained syncope, hypertension, dyslipidemia, status post insertion of an implantable loop recorder. Her device has reached end-of-life. She has had no recurrent syncope. She denies chest pain, shortness of breath, or peripheral edema. No Known Allergies   Current Outpatient Prescriptions  Medication Sig Dispense Refill  . aspirin 325 MG tablet Take 325 mg by mouth daily.        Marland Kitchen atenolol (TENORMIN) 50 MG tablet Take 50 mg by mouth daily.        . calcium-vitamin D (OSCAL WITH D) 500-200 MG-UNIT per tablet Take 1 tablet by mouth 2 (two) times daily.        Marland Kitchen lisinopril-hydrochlorothiazide (PRINZIDE,ZESTORETIC) 10-12.5 MG per tablet Take 1 tablet by mouth daily.        . Multiple Vitamin (MULTIVITAMIN) tablet Take 1 tablet by mouth daily.        . simvastatin (ZOCOR) 20 MG tablet Take 20 mg by mouth at bedtime.           Past Medical History  Diagnosis Date  . Dyslipidemia   . Hypertension   . Syncope   . S/P hysterectomy     ROS:   All systems reviewed and negative except as noted in the HPI.   Past Surgical History  Procedure Date  . Status post knee surgery   . Implantation of implantable loop recorder      Family History  Problem Relation Age of Onset  . Diabetes Other      History   Social History  . Marital Status: Single    Spouse Name: N/A    Number of Children: N/A  . Years of Education: N/A   Occupational History  . 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 Smoking status: Never Smoker   . Smokeless tobacco: Not on file  . Alcohol Use: No  . Drug Use: Not on file  . Sexually Active: Not on file   Other Topics Concern  . Not on file   Social History Narrative  . No narrative on file     BP 142/60  Pulse 59  Ht 5\' 5"  (1.651 m)  Wt 89.359 kg (197 lb)  BMI 32.78 kg/m2  Physical Exam:  Well  appearing elderly woman, NAD HEENT: Unremarkable Neck:  No JVD, no thyromegally Lymphatics:  No adenopathy Back:  No CVA tenderness Lungs:  Clear with no wheezes, rales, or rhonchi. Well-healed implantable loop recorder incision. HEART:  Regular rate rhythm, no murmurs, no rubs, no clicks Abd:  soft, positive bowel sounds, no organomegally, no rebound, no guarding Ext:  2 plus pulses, no edema, no cyanosis, no clubbing Skin:  No rashes no nodules Neuro:  CN II through XII intact, motor grossly intact  DEVICE  End-of-life.  Assess/Plan:

## 2011-02-05 NOTE — Patient Instructions (Signed)
Your physician wants you to follow-up in: 12 months with Dr. Taylor. You will receive a reminder letter in the mail two months in advance. If you don't receive a letter, please call our office to schedule the follow-up appointment.    

## 2011-02-05 NOTE — Assessment & Plan Note (Signed)
She has had no recurrent symptoms. Her loop recorder is at end-of-life. I discussed the options of removing the device or leaving it in place. She would like to keep the device in place for the present time.

## 2011-11-15 ENCOUNTER — Other Ambulatory Visit: Payer: Self-pay | Admitting: Internal Medicine

## 2011-11-15 DIAGNOSIS — Z1231 Encounter for screening mammogram for malignant neoplasm of breast: Secondary | ICD-10-CM

## 2011-12-19 ENCOUNTER — Ambulatory Visit
Admission: RE | Admit: 2011-12-19 | Discharge: 2011-12-19 | Disposition: A | Discharge status: Home / Self Care | Payer: Medicare Other | Source: Ambulatory Visit | Attending: Internal Medicine | Admitting: Internal Medicine

## 2011-12-19 DIAGNOSIS — Z1231 Encounter for screening mammogram for malignant neoplasm of breast: Secondary | ICD-10-CM

## 2012-02-07 ENCOUNTER — Encounter: Payer: Self-pay | Admitting: Internal Medicine

## 2012-02-07 ENCOUNTER — Ambulatory Visit (INDEPENDENT_AMBULATORY_CARE_PROVIDER_SITE_OTHER): Payer: Medicare Other | Admitting: Internal Medicine

## 2012-02-07 ENCOUNTER — Encounter: Payer: Self-pay | Admitting: *Deleted

## 2012-02-07 VITALS — BP 190/65 | HR 63 | Ht 63.5 in | Wt 194.8 lb

## 2012-02-07 DIAGNOSIS — R55 Syncope and collapse: Secondary | ICD-10-CM

## 2012-02-07 DIAGNOSIS — I10 Essential (primary) hypertension: Secondary | ICD-10-CM

## 2012-02-07 NOTE — Progress Notes (Signed)
HPI [name] returns today for followup. She is a very pleasant 78 year old woman with a history of hypertension and unexplained syncope. She is status post insertion of an implantable loop recorder. Her device has reached elective replacement. The patient has had no recurrent syncope. She denies chest pain or shortness of breath. She does note some dietary indiscretion with sodium. She did not take her medications this morning. No Known Allergies   Current Outpatient Prescriptions  Medication Sig Dispense Refill  . aspirin 325 MG tablet Take 325 mg by mouth daily.        Marland Kitchen atenolol (TENORMIN) 50 MG tablet Take 50 mg by mouth daily.        . calcium-vitamin D (OSCAL WITH D) 500-200 MG-UNIT per tablet Take 1 tablet by mouth 2 (two) times daily.        . ergocalciferol (VITAMIN D2) 50000 UNITS capsule Take 50,000 Units by mouth every 14 (fourteen) days.      Marland Kitchen lisinopril-hydrochlorothiazide (PRINZIDE,ZESTORETIC) 10-12.5 MG per tablet Take 1 tablet by mouth daily.        . Multiple Vitamin (MULTIVITAMIN) tablet Take 1 tablet by mouth daily.        . simvastatin (ZOCOR) 20 MG tablet Take 20 mg by mouth at bedtime.           Past Medical History  Diagnosis Date  . Dyslipidemia   . Hypertension   . Syncope   . S/P hysterectomy     ROS:   All systems reviewed and negative except as noted in the HPI.   Past Surgical History  Procedure Date  . Status post knee surgery   . Implantation of implantable loop recorder      Family History  Problem Relation Age of Onset  . Diabetes Other      History   Social History  . Marital Status: Single    Spouse Name: N/A    Number of Children: N/A  . Years of Education: N/A   Occupational History  . 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 Smoking status: Never Smoker   . Smokeless tobacco: Not on file  . Alcohol Use: No  . Drug Use: Not on file  . Sexually Active: Not on file   Other  Topics Concern  . Not on file   Social History Narrative  . No narrative on file     BP 190/65  Pulse 63  Ht 5' 3.5" (1.613 m)  Wt 194 lb 12.8 oz (88.361 kg)  BMI 33.97 kg/m2  Physical Exam:  Well appearing elderly woman, NAD HEENT: Unremarkable Neck:  7 cm JVD, no thyromegally Lungs:  Clear with no wheezes, rales, or rhonchi. HEART:  Regular rate rhythm, no murmurs, no rubs, no clicks, S4 gallop is present Abd:  soft, positive bowel sounds, no organomegally, no rebound, no guarding Ext:  2 plus pulses, no edema, no cyanosis, no clubbing Skin:  No rashes no nodules Neuro:  CN II through XII intact, motor grossly intact  EKG Normal sinus rhythm with sinus bradycardia and first-degree AV block DEVICE  Normal device function.  See PaceArt for details. Implantable loop recorder is at elective replacement  Assess/Plan:

## 2012-02-07 NOTE — Assessment & Plan Note (Signed)
She has had no recurrent episodes. Her implantable loop recorder is at elective replacement. We'll plan to remove this device, but will not place a new one.

## 2012-02-07 NOTE — Assessment & Plan Note (Signed)
Her blood pressure is elevated today. She notes that she did not take her medications this morning. I've encouraged the patient to go home and take her medications. She does not have a headache. When I check her blood pressure, it is 175/75.

## 2012-03-06 ENCOUNTER — Other Ambulatory Visit (INDEPENDENT_AMBULATORY_CARE_PROVIDER_SITE_OTHER): Payer: Medicare Other

## 2012-03-06 DIAGNOSIS — R55 Syncope and collapse: Secondary | ICD-10-CM

## 2012-03-06 LAB — CBC WITH DIFFERENTIAL/PLATELET
Basophils Absolute: 0 10*3/uL (ref 0.0–0.1)
Eosinophils Relative: 3.5 % (ref 0.0–5.0)
Lymphocytes Relative: 42.7 % (ref 12.0–46.0)
Lymphs Abs: 1.3 10*3/uL (ref 0.7–4.0)
Monocytes Relative: 16 % — ABNORMAL HIGH (ref 3.0–12.0)
Neutrophils Relative %: 37.2 % — ABNORMAL LOW (ref 43.0–77.0)
Platelets: 186 10*3/uL (ref 150.0–400.0)
RDW: 13.3 % (ref 11.5–14.6)
WBC: 2.9 10*3/uL — ABNORMAL LOW (ref 4.5–10.5)

## 2012-03-06 LAB — BASIC METABOLIC PANEL
BUN: 16 mg/dL (ref 6–23)
Calcium: 9.5 mg/dL (ref 8.4–10.5)
Creatinine, Ser: 1 mg/dL (ref 0.4–1.2)
GFR: 68.05 mL/min (ref >60.00)
Glucose, Bld: 89 mg/dL (ref 70–99)

## 2012-03-09 ENCOUNTER — Encounter (HOSPITAL_COMMUNITY): Payer: Self-pay | Admitting: Respiratory Therapy

## 2012-03-12 MED ORDER — SODIUM CHLORIDE 0.9 % IR SOLN
80.0000 mg | Status: DC
  Filled 2012-03-12: qty 2

## 2012-03-12 MED ORDER — CEFAZOLIN SODIUM-DEXTROSE 2-3 GM-% IV SOLR
2.0000 g | INTRAVENOUS | Status: DC
  Filled 2012-03-12 (×2): qty 50

## 2012-03-13 ENCOUNTER — Encounter (HOSPITAL_COMMUNITY)
Admission: RE | Disposition: A | Discharge status: Home / Self Care | Payer: Self-pay | Source: Ambulatory Visit | Attending: Internal Medicine

## 2012-03-13 ENCOUNTER — Ambulatory Visit (HOSPITAL_COMMUNITY)
Admission: RE | Admit: 2012-03-13 | Discharge: 2012-03-13 | Disposition: A | Discharge status: Home / Self Care | Payer: Medicare Other | Source: Ambulatory Visit | Attending: Internal Medicine | Admitting: Internal Medicine

## 2012-03-13 DIAGNOSIS — I10 Essential (primary) hypertension: Secondary | ICD-10-CM | POA: Insufficient documentation

## 2012-03-13 DIAGNOSIS — Z4509 Encounter for adjustment and management of other cardiac device: Secondary | ICD-10-CM | POA: Insufficient documentation

## 2012-03-13 DIAGNOSIS — R55 Syncope and collapse: Secondary | ICD-10-CM | POA: Insufficient documentation

## 2012-03-13 HISTORY — PX: LOOP RECORDER EXPLANT: SHX5476

## 2012-03-13 SURGERY — LOOP RECORDER EXPLANT
Anesthesia: LOCAL

## 2012-03-13 MED ORDER — SODIUM CHLORIDE 0.9 % IJ SOLN: 3.0000 mL | INTRAMUSCULAR | Status: DC | PRN

## 2012-03-13 MED ORDER — LIDOCAINE HCL (PF) 1 % IJ SOLN
INTRAMUSCULAR | Status: AC
  Filled 2012-03-13: qty 30

## 2012-03-13 MED ORDER — MIDAZOLAM HCL 5 MG/5ML IJ SOLN
INTRAMUSCULAR | Status: AC
  Filled 2012-03-13: qty 5

## 2012-03-13 MED ORDER — SODIUM CHLORIDE 0.9 % IJ SOLN
3.0000 mL | Freq: Two times a day (BID) | INTRAMUSCULAR | Status: DC
Start: 2012-03-13 — End: 2012-03-13

## 2012-03-13 MED ORDER — FENTANYL CITRATE 0.05 MG/ML IJ SOLN
INTRAMUSCULAR | Status: AC
Start: 2012-03-13 — End: 2012-03-13
  Filled 2012-03-13: qty 2

## 2012-03-13 MED ORDER — SODIUM CHLORIDE 0.45 % IV SOLN
INTRAVENOUS | Status: DC
Start: 2012-03-13 — End: 2012-03-13
  Administered 2012-03-13: 06:00:00 via INTRAVENOUS

## 2012-03-13 MED ORDER — CHLORHEXIDINE GLUCONATE 4 % EX LIQD: 60.0000 mL | Freq: Once | CUTANEOUS | Status: DC

## 2012-03-13 MED ORDER — SODIUM CHLORIDE 0.9 % IV SOLN
250.0000 mL | INTRAVENOUS | Status: DC
Start: 2012-03-13 — End: 2012-03-13

## 2012-03-13 NOTE — H&P (Signed)
HPI  Mrs. Roston returns today for removal of her ILR. She is a very pleasant 79 year old woman with a history of hypertension and unexplained syncope. She is status post insertion of an implantable loop recorder. Her device has reached elective replacement. The patient has had no recurrent syncope. She denies chest pain or shortness of breath. She does note some dietary indiscretion with sodium. She did not take her medications this morning.  No Known Allergies  Current Outpatient Prescriptions   Medication  Sig  Dispense  Refill   .  aspirin 325 MG tablet  Take 325 mg by mouth daily.     Marland Kitchen  atenolol (TENORMIN) 50 MG tablet  Take 50 mg by mouth daily.     .  calcium-vitamin D (OSCAL WITH D) 500-200 MG-UNIT per tablet  Take 1 tablet by mouth 2 (two) times daily.     .  ergocalciferol (VITAMIN D2) 50000 UNITS capsule  Take 50,000 Units by mouth every 14 (fourteen) days.     Marland Kitchen  lisinopril-hydrochlorothiazide (PRINZIDE,ZESTORETIC) 10-12.5 MG per tablet  Take 1 tablet by mouth daily.     .  Multiple Vitamin (MULTIVITAMIN) tablet  Take 1 tablet by mouth daily.     .  simvastatin (ZOCOR) 20 MG tablet  Take 20 mg by mouth at bedtime.      Past Medical History   Diagnosis  Date   .  Dyslipidemia    .  Hypertension    .  Syncope    .  S/P hysterectomy     ROS:  All systems reviewed and negative except as noted in the HPI.  Past Surgical History   Procedure  Date   .  Status post knee surgery    .  Implantation of implantable loop recorder     Family History   Problem  Relation  Age of Onset   .  Diabetes  Other     History    Social History   .  Marital Status:  Single     Spouse Name:  N/A     Number of Children:  N/A   .  Years of Education:  N/A    Occupational History   .  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  Smoking status:  Never Smoker   .  Smokeless tobacco:  Not on file   .  Alcohol Use:  No   .  Drug Use:  Not on file     .  Sexually Active:  Not on file    Other Topics  Concern   .  Not on file    Social History Narrative   .  No narrative on file    BP 190/65  Pulse 63  Ht 5' 3.5" (1.613 m)  Wt 194 lb 12.8 oz (88.361 kg)  BMI 33.97 kg/m2  Physical Exam:  Well appearing elderly woman, NAD  HEENT: Unremarkable  Neck: 7 cm JVD, no thyromegally  Lungs: Clear with no wheezes, rales, or rhonchi.  HEART: Regular rate rhythm, no murmurs, no rubs, no clicks, S4 gallop is present  Abd: soft, positive bowel sounds, no organomegally, no rebound, no guarding  Ext: 2 plus pulses, no edema, no cyanosis, no clubbing  Skin: No rashes no nodules  Neuro: CN II through XII intact, motor grossly intact  EKG  Normal sinus rhythm with sinus bradycardia and first-degree AV block  DEVICE  Normal device function. See PaceArt for details. Implantable loop recorder is at elective replacement  Assess/Plan:  SYNCOPE -   She has had no recurrent episodes. Her implantable loop recorder is at elective replacement. We'll plan to remove this device, but will not place a new one.   Leonia Reeves.D.

## 2012-03-13 NOTE — Op Note (Signed)
EP Procedure Noted  Procedure: ILR removal Indication: syncope s/p ILR with device at [facility]  Findings: After informed consent was obtained, the patient was taken to the diagnostic electrophysiology lab in the fasting state. After the usual preparation and draping, 30 cc of lidocaine was infiltrated into the subpectoral space. A 3 cm incision was carried out over this region. Electrocautery was utilized to dissect down to the implantable loop recorder pocket. The loop recorder was removed with gentle traction. The pocket was irrigated with antibiotic irrigation. Electrocautery was used to assure hemostasis. The incision was closed with 2-0 and 3-0 Vicryl suture. Benzoin and Steri-Strips were patent on the skin. The patient was returned to the recovery area in satisfactory condition. There were no immediate procedure complications.  Results: This demonstrates successful removal of an implantable loop recorder which had reached elective replacement.  Lewayne Bunting, M.D.

## 2012-03-13 NOTE — Interval H&P Note (Signed)
History and Physical Interval Note:  03/13/2012 7:02 AM  [name]  has presented today for surgery, with the diagnosis of syncope  The various methods of treatment have been discussed with the patient and family. After consideration of risks, benefits and other options for treatment, the patient has consented to  Procedure(s) (LRB) with comments: LOOP RECORDER EXPLANT (N/A) as a surgical intervention .  The patient's history has been reviewed, patient examined, no change in status, stable for surgery.  I have reviewed the patient's chart and labs.  Questions were answered to the patient's satisfaction.     Leonia Reeves.D.

## 2012-11-16 ENCOUNTER — Other Ambulatory Visit: Payer: Self-pay

## 2012-11-16 DIAGNOSIS — Z1231 Encounter for screening mammogram for malignant neoplasm of breast: Secondary | ICD-10-CM

## 2012-12-21 ENCOUNTER — Ambulatory Visit
Admission: RE | Admit: 2012-12-21 | Discharge: 2012-12-21 | Disposition: A | Discharge status: Home / Self Care | Payer: Medicare Other | Source: Ambulatory Visit

## 2012-12-21 DIAGNOSIS — Z1231 Encounter for screening mammogram for malignant neoplasm of breast: Secondary | ICD-10-CM

## 2013-04-06 ENCOUNTER — Encounter: Payer: Self-pay | Admitting: Internal Medicine

## 2013-05-10 ENCOUNTER — Encounter: Payer: Self-pay | Admitting: Internal Medicine

## 2013-05-10 ENCOUNTER — Ambulatory Visit: Payer: Medicare Other | Admitting: Internal Medicine

## 2013-05-10 ENCOUNTER — Ambulatory Visit (INDEPENDENT_AMBULATORY_CARE_PROVIDER_SITE_OTHER): Payer: Medicare Other | Admitting: Internal Medicine

## 2013-05-10 VITALS — BP 152/70 | HR 60 | Ht 63.0 in | Wt 178.6 lb

## 2013-05-10 DIAGNOSIS — K5900 Constipation, unspecified: Secondary | ICD-10-CM

## 2013-05-10 DIAGNOSIS — D649 Anemia, unspecified: Secondary | ICD-10-CM

## 2013-05-10 DIAGNOSIS — Z1211 Encounter for screening for malignant neoplasm of colon: Secondary | ICD-10-CM

## 2013-05-10 NOTE — Progress Notes (Signed)
HISTORY OF PRESENT ILLNESS:  [name] is a 80 y.o. female with hypertension and dyslipidemia. Patient underwent her routine annual physical evaluation and laboratories last month. She asked her primary provider, Dr. Eloise HarmanPaterson, if she should have colonoscopy, given her age. This referral is made. The patient's GI review of systems is entirely negative except for chronic mild constipation which is managed by eating vegetables. Her outside (reviewed) blood work was remarkable for normocytic anemia. This is being worked up. Hemoccult negative stool. Of importance, the patient did undergo complete colonoscopy with excellent preparation May 2004. Examination was normal. No interval family history of colon cancer.  REVIEW OF SYSTEMS:  All non-GI ROS negative except for  Past Medical History  Diagnosis Date  . Dyslipidemia   . Hypertension   . Syncope   . S/P hysterectomy     Past Surgical History  Procedure Laterality Date  . Status post knee surgery    . Implantation of implantable loop recorder    . Abdominal hysterectomy      Social History [name]  reports that she has never smoked. She has never used smokeless tobacco. She reports that she does not drink alcohol or use illicit drugs.  family history includes Diabetes in her other.  No Known Allergies     PHYSICAL EXAMINATION: Vital signs: BP 152/70  Pulse 60  Ht 5\' 3"  (1.6 m)  Wt 178 lb 9.6 oz (81.012 kg)  BMI 31.65 kg/m2 General: Well-developed, well-nourished, no acute distress HEENT: Sclerae are anicteric, conjunctiva pink. Oral mucosa intact Lungs: Clear Heart: Regular Abdomen: soft, nontender, nondistended, no obvious ascites, no peritoneal signs, normal bowel sounds. No organomegaly. Extremities: No edema Psychiatric: alert and oriented x3. Cooperative   ASSESSMENT:  #1. Colon cancer screening. Negative colonoscopy with excellent preparation 2004. No indication for routine followup screening at  her age, in keeping with current guidelines. We discussed this in detail. She was satisfied #2. Mild constipation. Chronic in stable. Managed with diet, simply #3. Normocytic anemia. Hemoccult negative stool. Being worked up by PCP   PLAN:  #1. Continue fiber #2. Return to the care of PCP. GI followup as needed

## 2013-05-10 NOTE — Patient Instructions (Signed)
Please follow up with Dr. Perry as needed 

## 2013-11-25 ENCOUNTER — Other Ambulatory Visit: Payer: Self-pay

## 2013-11-25 DIAGNOSIS — Z1239 Encounter for other screening for malignant neoplasm of breast: Secondary | ICD-10-CM

## 2013-12-27 ENCOUNTER — Other Ambulatory Visit: Payer: Self-pay

## 2013-12-27 ENCOUNTER — Ambulatory Visit
Admission: RE | Admit: 2013-12-27 | Discharge: 2013-12-27 | Disposition: A | Discharge status: Home / Self Care | Payer: Commercial Managed Care - HMO | Source: Ambulatory Visit

## 2013-12-27 DIAGNOSIS — Z1231 Encounter for screening mammogram for malignant neoplasm of breast: Secondary | ICD-10-CM

## 2014-02-03 ENCOUNTER — Encounter (HOSPITAL_COMMUNITY): Payer: Self-pay | Admitting: Internal Medicine

## 2014-08-07 ENCOUNTER — Encounter (HOSPITAL_COMMUNITY): Payer: Self-pay | Admitting: Emergency Medicine

## 2014-08-07 ENCOUNTER — Observation Stay (HOSPITAL_COMMUNITY)
Admission: EM | Admit: 2014-08-07 | Discharge: 2014-08-08 | Disposition: A | Discharge status: Home / Self Care | Payer: Commercial Managed Care - HMO | Attending: Family Medicine | Admitting: Family Medicine

## 2014-08-07 ENCOUNTER — Emergency Department (HOSPITAL_COMMUNITY): Payer: Commercial Managed Care - HMO

## 2014-08-07 DIAGNOSIS — E871 Hypo-osmolality and hyponatremia: Secondary | ICD-10-CM | POA: Insufficient documentation

## 2014-08-07 DIAGNOSIS — I10 Essential (primary) hypertension: Secondary | ICD-10-CM | POA: Diagnosis not present

## 2014-08-07 DIAGNOSIS — R55 Syncope and collapse: Principal | ICD-10-CM | POA: Insufficient documentation

## 2014-08-07 DIAGNOSIS — R001 Bradycardia, unspecified: Secondary | ICD-10-CM | POA: Insufficient documentation

## 2014-08-07 DIAGNOSIS — Z7982 Long term (current) use of aspirin: Secondary | ICD-10-CM | POA: Insufficient documentation

## 2014-08-07 DIAGNOSIS — R197 Diarrhea, unspecified: Secondary | ICD-10-CM | POA: Insufficient documentation

## 2014-08-07 DIAGNOSIS — E785 Hyperlipidemia, unspecified: Secondary | ICD-10-CM | POA: Diagnosis not present

## 2014-08-07 DIAGNOSIS — I119 Hypertensive heart disease without heart failure: Secondary | ICD-10-CM | POA: Diagnosis present

## 2014-08-07 LAB — CBC WITH DIFFERENTIAL/PLATELET
BASOS ABS: 0 10*3/uL (ref 0.0–0.1)
BASOS PCT: 0 % (ref 0–1)
Eosinophils Absolute: 0 10*3/uL (ref 0.0–0.7)
Eosinophils Relative: 1 % (ref 0–5)
HEMATOCRIT: 29.8 % — AB (ref 36.0–46.0)
Hemoglobin: 9.5 g/dL — ABNORMAL LOW (ref 12.0–15.0)
LYMPHS ABS: 0.5 10*3/uL — AB (ref 0.7–4.0)
Lymphocytes Relative: 17 % (ref 12–46)
MCH: 29.2 pg (ref 26.0–34.0)
MCHC: 31.9 g/dL (ref 30.0–36.0)
MCV: 91.7 fL (ref 78.0–100.0)
MONOS PCT: 20 % — AB (ref 3–12)
Monocytes Absolute: 0.6 10*3/uL (ref 0.1–1.0)
Neutro Abs: 1.9 10*3/uL (ref 1.7–7.7)
Neutrophils Relative %: 62 % (ref 43–77)
PLATELETS: 134 10*3/uL — AB (ref 150–400)
RBC: 3.25 MIL/uL — ABNORMAL LOW (ref 3.87–5.11)
RDW: 12.4 % (ref 11.5–15.5)
WBC: 3 10*3/uL — ABNORMAL LOW (ref 4.0–10.5)

## 2014-08-07 LAB — BASIC METABOLIC PANEL
Anion gap: 8 (ref 5–15)
BUN: 15 mg/dL (ref 6–20)
CO2: 26 mmol/L (ref 22–32)
Calcium: 8.8 mg/dL — ABNORMAL LOW (ref 8.9–10.3)
Chloride: 97 mmol/L — ABNORMAL LOW (ref 101–111)
Creatinine, Ser: 1.03 mg/dL — ABNORMAL HIGH (ref 0.44–1.00)
GFR calc Af Amer: 56 mL/min — ABNORMAL LOW (ref >60)
GFR calc non Af Amer: 48 mL/min — ABNORMAL LOW (ref >60)
GLUCOSE: 104 mg/dL — AB (ref 65–99)
Potassium: 3.9 mmol/L (ref 3.5–5.1)
SODIUM: 131 mmol/L — AB (ref 135–145)

## 2014-08-07 LAB — URINE MICROSCOPIC-ADD ON

## 2014-08-07 LAB — URINALYSIS, ROUTINE W REFLEX MICROSCOPIC
BILIRUBIN URINE: NEGATIVE
GLUCOSE, UA: NEGATIVE mg/dL
Ketones, ur: NEGATIVE mg/dL
NITRITE: NEGATIVE
PROTEIN: 30 mg/dL — AB
SPECIFIC GRAVITY, URINE: 1.012 (ref 1.005–1.030)
Urobilinogen, UA: 0.2 mg/dL (ref 0.0–1.0)
pH: 6.5 (ref 5.0–8.0)

## 2014-08-07 LAB — PHOSPHORUS: PHOSPHORUS: 3.3 mg/dL (ref 2.5–4.6)

## 2014-08-07 LAB — POC OCCULT BLOOD, ED: Fecal Occult Bld: NEGATIVE

## 2014-08-07 LAB — TROPONIN I: Troponin I: <0.03 ng/mL (ref <0.031)

## 2014-08-07 LAB — MAGNESIUM: MAGNESIUM: 1.8 mg/dL (ref 1.7–2.4)

## 2014-08-07 MED ORDER — SIMVASTATIN 20 MG PO TABS
20.0000 mg | ORAL_TABLET | Freq: Every day | ORAL | Status: DC
  Administered 2014-08-07: 20 mg via ORAL
  Filled 2014-08-07: qty 1

## 2014-08-07 MED ORDER — SODIUM CHLORIDE 0.9 % IV SOLN
INTRAVENOUS | Status: DC
Start: 2014-08-07 — End: 2014-08-08
  Administered 2014-08-07 – 2014-08-08 (×2): via INTRAVENOUS

## 2014-08-07 MED ORDER — SODIUM CHLORIDE 0.9 % IJ SOLN: 3.0000 mL | Freq: Two times a day (BID) | INTRAMUSCULAR | Status: DC

## 2014-08-07 MED ORDER — BENZONATATE 100 MG PO CAPS
100.0000 mg | ORAL_CAPSULE | Freq: Two times a day (BID) | ORAL | Status: DC | PRN
  Administered 2014-08-07: 100 mg via ORAL
  Filled 2014-08-07 (×2): qty 1

## 2014-08-07 MED ORDER — HYDRALAZINE HCL 20 MG/ML IJ SOLN
5.0000 mg | Freq: Four times a day (QID) | INTRAMUSCULAR | Status: DC | PRN
  Administered 2014-08-07 – 2014-08-08 (×2): 5 mg via INTRAVENOUS
  Filled 2014-08-07 (×2): qty 1

## 2014-08-07 MED ORDER — ASPIRIN 325 MG PO TABS
325.0000 mg | ORAL_TABLET | Freq: Every day | ORAL | Status: DC
  Administered 2014-08-07 – 2014-08-08 (×2): 325 mg via ORAL
  Filled 2014-08-07 (×2): qty 1

## 2014-08-07 MED ORDER — CALCIUM CARBONATE-VITAMIN D 500-200 MG-UNIT PO TABS
1.0000 | ORAL_TABLET | Freq: Two times a day (BID) | ORAL | Status: DC
  Administered 2014-08-07 – 2014-08-08 (×2): 1 via ORAL
  Filled 2014-08-07 (×2): qty 1

## 2014-08-07 MED ORDER — SODIUM CHLORIDE 0.9 % IV SOLN
Freq: Once | INTRAVENOUS | Status: AC
  Administered 2014-08-07: 17:00:00 via INTRAVENOUS

## 2014-08-07 MED ORDER — HEPARIN SODIUM (PORCINE) 5000 UNIT/ML IJ SOLN
5000.0000 [IU] | Freq: Three times a day (TID) | INTRAMUSCULAR | Status: DC
  Administered 2014-08-07 – 2014-08-08 (×3): 5000 [IU] via SUBCUTANEOUS
  Filled 2014-08-07 (×3): qty 1

## 2014-08-07 MED ORDER — OXYBUTYNIN CHLORIDE 5 MG PO TABS
5.0000 mg | ORAL_TABLET | Freq: Two times a day (BID) | ORAL | Status: DC
  Administered 2014-08-07 – 2014-08-08 (×2): 5 mg via ORAL
  Filled 2014-08-07 (×2): qty 1

## 2014-08-07 NOTE — ED Provider Notes (Signed)
CSN: [id number]     Arrival date & time 08/07/14  1043 History   First MD Initiated Contact with Patient 08/07/14 1117     Chief Complaint  Patient presents with  . Loss of Consciousness     (Consider location/radiation/quality/duration/timing/severity/associated sxs/prior Treatment) Patient is a 81 y.o. female presenting with syncope.  Loss of Consciousness Episode history:  Single Most recent episode:  Today Duration:  90 seconds Timing:  Constant Progression:  Resolved Chronicity:  New Context comment:  Diarrhea Witnessed: no   Relieved by:  Lying down Associated symptoms: diaphoresis   Associated symptoms: no chest pain, no fever, no focal weakness, no nausea, no shortness of breath and no vomiting   Associated symptoms comment:  Cough and cold for a few days   Past Medical History  Diagnosis Date  . Dyslipidemia   . Hypertension   . Syncope   . S/P hysterectomy    Past Surgical History  Procedure Laterality Date  . Status post knee surgery    . Implantation of implantable loop recorder    . Abdominal hysterectomy    . Loop recorder explant N/A 03/13/2012    Procedure: LOOP RECORDER EXPLANT;  Surgeon: Marinus Maw, MD;  Location: [facility] CATH LAB;  Service: Cardiovascular;  Laterality: N/A;   Family History  Problem Relation Age of Onset  . Diabetes Other    History  Substance Use Topics  . Smoking status: Never Smoker   . Smokeless tobacco: Never Used  . Alcohol Use: No   OB History    No data available     Review of Systems  Constitutional: Positive for diaphoresis. Negative for fever.  Respiratory: Negative for shortness of breath.   Cardiovascular: Positive for syncope. Negative for chest pain.  Gastrointestinal: Negative for nausea and vomiting.  Neurological: Negative for focal weakness.  All other systems reviewed and are negative.     Allergies  Review of patient's allergies indicates no known allergies.  Home Medications   Prior to  Admission medications   Medication Sig Start Date End Date Taking? Authorizing Provider  aspirin 325 MG tablet Take 325 mg by mouth daily.      Historical Provider, MD  atenolol (TENORMIN) 50 MG tablet Take 50 mg by mouth daily.      Historical Provider, MD  calcium-vitamin D (OSCAL WITH D) 500-200 MG-UNIT per tablet Take 1 tablet by mouth 2 (two) times daily.      Historical Provider, MD  ergocalciferol (VITAMIN D2) 50000 UNITS capsule Take 50,000 Units by mouth every 14 (fourteen) days.    Historical Provider, MD  lisinopril-hydrochlorothiazide (PRINZIDE,ZESTORETIC) 10-12.5 MG per tablet Take 1 tablet by mouth daily.      Historical Provider, MD  Multiple Vitamin (MULTIVITAMIN) tablet Take 1 tablet by mouth daily.      Historical Provider, MD  oxybutynin (DITROPAN) 5 MG tablet 5 mg. 04/01/12   Historical Provider, MD  simvastatin (ZOCOR) 20 MG tablet Take 20 mg by mouth at bedtime.      Historical Provider, MD   BP 174/44 mmHg  Pulse 55  Temp(Src) 99.2 F (37.3 C) (Oral)  Resp 17  SpO2 95% Physical Exam  Constitutional: She is oriented to person, place, and time. She appears well-developed and well-nourished. No distress.  HENT:  Head: Normocephalic and atraumatic.  Mouth/Throat: Oropharynx is clear and moist.  Eyes: Conjunctivae are normal. Pupils are equal, round, and reactive to light. No scleral icterus.  Neck: Neck supple.  Cardiovascular: Normal rate, regular  rhythm, normal heart sounds and intact distal pulses.   No murmur heard. Pulmonary/Chest: Effort normal and breath sounds normal. No stridor. No respiratory distress. She has no wheezes. She has no rales.  Abdominal: Soft. Bowel sounds are normal. She exhibits no distension. There is no tenderness. There is no rebound and no guarding.  Musculoskeletal: Normal range of motion.  Neurological: She is alert and oriented to person, place, and time.  Skin: Skin is warm and dry. No rash noted.  Psychiatric: She has a normal mood  and affect. Her behavior is normal.  Nursing note and vitals reviewed.   ED Course  Procedures (including critical care time) Labs Review Labs Reviewed  CBC WITH DIFFERENTIAL/PLATELET - Abnormal; Notable for the following:    WBC 3.0 (*)    RBC 3.25 (*)    Hemoglobin 9.5 (*)    HCT 29.8 (*)    Platelets 134 (*)    Lymphs Abs 0.5 (*)    Monocytes Relative 20 (*)    All other components within normal limits  BASIC METABOLIC PANEL - Abnormal; Notable for the following:    Sodium 131 (*)    Chloride 97 (*)    Glucose, Bld 104 (*)    Creatinine, Ser 1.03 (*)    Calcium 8.8 (*)    GFR calc non Af Amer 48 (*)    GFR calc Af Amer 56 (*)    All other components within normal limits  URINALYSIS, ROUTINE W REFLEX MICROSCOPIC (NOT AT [facility]) - Abnormal; Notable for the following:    APPearance CLOUDY (*)    Hgb urine dipstick SMALL (*)    Protein, ur 30 (*)    Leukocytes, UA MODERATE (*)    All other components within normal limits  URINE MICROSCOPIC-ADD ON - Abnormal; Notable for the following:    Squamous Epithelial / LPF MANY (*)    Bacteria, UA MANY (*)    All other components within normal limits  TROPONIN I  OCCULT BLOOD X 1 CARD TO LAB, STOOL  POC OCCULT BLOOD, ED    Imaging Review Dg Chest Port 1 View  08/07/2014   CLINICAL DATA:  Syncopal event today  EXAM: PORTABLE CHEST - 1 VIEW  COMPARISON:  03/15/2007  FINDINGS: Cardiac shadow is enlarged. Bibasilar scarring is noted stable from the prior exam. No new focal infiltrate is seen. Mild vascular congestion is noted.  IMPRESSION: Chronic changes in the bases with mild superimposed vascular congestion.   Electronically Signed   By: Alcide Clever M.D.   On: 08/07/2014 13:45     EKG Interpretation   Date/Time:  Sunday August 07 2014 12:00:47 EDT Ventricular Rate:  61 PR Interval:  321 QRS Duration: 91 QT Interval:  422 QTC Calculation: 425 R Axis:   46 Text Interpretation:  Sinus rhythm Prolonged PR interval Anteroseptal   infarct, old No significant change was found Confirmed by [facility]  MD,  TREY ([id number]) on 08/07/2014 3:19:45 PM      MDM   Final diagnoses:  Syncope    Admit for syncope workup.      Blake Divine, MD 08/07/14 [id number]

## 2014-08-07 NOTE — ED Notes (Signed)
Per EMS, states she went to bathroom and had a bout of diarrhea-as she was getting off commode, she felt dizzy and slid to floor-states she lost consciousness, brady, pause documented of EKG

## 2014-08-07 NOTE — ED Notes (Signed)
POC occult - neg

## 2014-08-07 NOTE — ED Notes (Signed)
Bed: WA16 Expected date:  Expected time:  Means of arrival:  Comments: EMS-syncope 

## 2014-08-07 NOTE — H&P (Signed)
[facility] History and Physical  [name] OZH:[id number] DOB: [birth year] DOA: 08/07/2014  Referring physician: Dr. Loretha Stapler PCP: Garlan Fillers, MD   Chief Complaint: Diarrhea, syncope and collapse  HPI: [name] is a 81 y.o. female  With history of hypertension who presented complaining of diarrhea for the last 2-3 days with syncopal episode and collapse today. Nothing she is aware of makes the diarrhea better or worse. The problem has been persistent since onset. She denies any bright red blood per rectum. The problem seems to be getting worse as such patient presented to the ED for further evaluation recommendations. The patient denies any palpitations, tongue biting, bladder or bowel incontinence after syncopal episode.  While in the ED patient was found to be bradycardic and hypertensive. Otherwise labs really nonrevealing for causes of syncope. We were consulted for medical evaluation recommendations.  Review of Systems:  Constitutional:  No weight loss, night sweats, Fevers, chills, fatigue.  HEENT:  No headaches, Difficulty swallowing,Tooth/dental problems,Sore throat,  No sneezing, itching, ear ache, nasal congestion, post nasal drip,  Cardio-vascular:  No chest pain, Orthopnea, PND, swelling in lower extremities, anasarca, dizziness, palpitations  GI:  No heartburn, indigestion, abdominal pain, nausea, vomiting, + diarrhea, change in bowel habits, loss of appetite  Resp:  No shortness of breath with exertion or at rest. No excess mucus, no productive cough, No non-productive cough, No coughing up of blood.No change in color of mucus.No wheezing.No chest wall deformity  Skin:  no rash or lesions.  GU:  no dysuria, change in color of urine, no urgency or frequency. No flank pain.  Musculoskeletal:  No joint pain or swelling. No decreased range of motion. No back pain.  Psych:  No change in mood or affect. No depression or anxiety. No memory loss.     Past Medical History  Diagnosis Date  . Dyslipidemia   . Hypertension   . Syncope   . S/P hysterectomy    Past Surgical History  Procedure Laterality Date  . Status post knee surgery    . Implantation of implantable loop recorder    . Abdominal hysterectomy    . Loop recorder explant N/A 03/13/2012    Procedure: LOOP RECORDER EXPLANT;  Surgeon: Marinus Maw, MD;  Location: [facility] CATH LAB;  Service: Cardiovascular;  Laterality: N/A;   Social History:  reports that she has never smoked. She has never used smokeless tobacco. She reports that she does not drink alcohol or use illicit drugs.  No Known Allergies  Family History  Problem Relation Age of Onset  . Diabetes Other      Prior to Admission medications   Medication Sig Start Date End Date Taking? Authorizing Provider  aspirin 325 MG tablet Take 325 mg by mouth daily.      Historical Provider, MD  atenolol (TENORMIN) 50 MG tablet Take 50 mg by mouth daily.      Historical Provider, MD  calcium-vitamin D (OSCAL WITH D) 500-200 MG-UNIT per tablet Take 1 tablet by mouth 2 (two) times daily.      Historical Provider, MD  ergocalciferol (VITAMIN D2) 50000 UNITS capsule Take 50,000 Units by mouth every 14 (fourteen) days.    Historical Provider, MD  lisinopril-hydrochlorothiazide (PRINZIDE,ZESTORETIC) 10-12.5 MG per tablet Take 1 tablet by mouth daily.      Historical Provider, MD  Multiple Vitamin (MULTIVITAMIN) tablet Take 1 tablet by mouth daily.      Historical Provider, MD  oxybutynin (DITROPAN) 5 MG tablet 5 mg.  04/01/12   Historical Provider, MD  simvastatin (ZOCOR) 20 MG tablet Take 20 mg by mouth at bedtime.      Historical Provider, MD   Physical Exam: Filed Vitals:   08/07/14 1056 08/07/14 1300  BP: 150/43 174/44  Pulse: 56 55  Temp: 98.3 F (36.8 C) 99.2 F (37.3 C)  TempSrc: Oral Oral  Resp: 16 17  SpO2: 97% 95%    Wt Readings from Last 3 Encounters:  05/10/13 81.012 kg (178 lb 9.6 oz)  03/13/12 87.544 kg  (193 lb)  02/07/12 88.361 kg (194 lb 12.8 oz)    General:  Appears calm and comfortable Eyes: PERRL, normal lids, irises & conjunctiva ENT: grossly normal hearing, lips & tongue, dry mucous membranes Neck: no LAD, masses or thyromegaly Cardiovascular: RRR, no m/r/g. No LE edema. Respiratory: CTA bilaterally, no w/r/r. Normal respiratory effort. Abdomen: soft, nt, nd, no guarding Skin: no rash or induration seen on limited exam Musculoskeletal: grossly normal tone BUE/BLE Psychiatric: grossly normal mood and affect, speech fluent and appropriate Neurologic: Answers questions appropriately, moves extremities equally           Labs on Admission:  Basic Metabolic Panel:  Recent Labs Lab 08/07/14 1150  NA 131*  K 3.9  CL 97*  CO2 26  GLUCOSE 104*  BUN 15  CREATININE 1.03*  CALCIUM 8.8*   Liver Function Tests: No results for input(s): AST, ALT, ALKPHOS, BILITOT, PROT, ALBUMIN in the last 168 hours. No results for input(s): LIPASE, AMYLASE in the last 168 hours. No results for input(s): AMMONIA in the last 168 hours. CBC:  Recent Labs Lab 08/07/14 1150  WBC 3.0*  NEUTROABS 1.9  HGB 9.5*  HCT 29.8*  MCV 91.7  PLT 134*   Cardiac Enzymes:  Recent Labs Lab 08/07/14 1150  TROPONINI <0.03    BNP (last 3 results) No results for input(s): BNP in the last 8760 hours.  ProBNP (last 3 results) No results for input(s): PROBNP in the last 8760 hours.  CBG: No results for input(s): GLUCAP in the last 168 hours.  Radiological Exams on Admission: Dg Chest Port 1 View  08/07/2014   CLINICAL DATA:  Syncopal event today  EXAM: PORTABLE CHEST - 1 VIEW  COMPARISON:  03/15/2007  FINDINGS: Cardiac shadow is enlarged. Bibasilar scarring is noted stable from the prior exam. No new focal infiltrate is seen. Mild vascular congestion is noted.  IMPRESSION: Chronic changes in the bases with mild superimposed vascular congestion.   Electronically Signed   By: Alcide Clever M.D.   On:  08/07/2014 13:45    EKG: Independently reviewed. Sinus rhythm with no st elevations or depressions  Assessment/Plan Active Problems:  Syncope and collapse - Etiology most likely related to intravascular hypovolemia secondary to diarrhea - We'll administer IV fluids - Obtain physical therapy evaluation next a.m. - Monitor on telemetry and obtain orthostatics - Obtain TSH  Hyperlipidemia - Stable continue aspirin   Essential hypertension -We'll hold beta blocker in lieu of bradycardia - Place order for when necessary hydralazine - Hold HCTZ given concerns for dehydration  Hyponatremia -Administer normal saline - Reassess next a.m.    Diarrhea -Obtain GI pathogen panel - For now treat supportively - No fevers and WBC at 3.0 as such we'll not commence antibiotics. If anything I suspect viral etiology  Code Status: full DVT Prophylaxis: heparin Family Communication: None at bedside Disposition Plan:  Most likely DC next a.m. with improvement in condition and negative workup  Time spent: >  55 minutes  Penny Pia [facility] Pager [phone]

## 2014-08-08 DIAGNOSIS — R197 Diarrhea, unspecified: Secondary | ICD-10-CM | POA: Diagnosis not present

## 2014-08-08 DIAGNOSIS — R55 Syncope and collapse: Secondary | ICD-10-CM | POA: Diagnosis not present

## 2014-08-08 DIAGNOSIS — E785 Hyperlipidemia, unspecified: Secondary | ICD-10-CM | POA: Diagnosis not present

## 2014-08-08 DIAGNOSIS — I10 Essential (primary) hypertension: Secondary | ICD-10-CM | POA: Diagnosis not present

## 2014-08-08 LAB — CBC
HCT: 27.6 % — ABNORMAL LOW (ref 36.0–46.0)
Hemoglobin: 8.8 g/dL — ABNORMAL LOW (ref 12.0–15.0)
MCH: 28.9 pg (ref 26.0–34.0)
MCHC: 31.9 g/dL (ref 30.0–36.0)
MCV: 90.8 fL (ref 78.0–100.0)
Platelets: 136 10*3/uL — ABNORMAL LOW (ref 150–400)
RBC: 3.04 MIL/uL — AB (ref 3.87–5.11)
RDW: 12.4 % (ref 11.5–15.5)
WBC: 2.9 10*3/uL — ABNORMAL LOW (ref 4.0–10.5)

## 2014-08-08 LAB — BASIC METABOLIC PANEL
Anion gap: 8 (ref 5–15)
BUN: 16 mg/dL (ref 6–20)
CO2: 25 mmol/L (ref 22–32)
Calcium: 8.5 mg/dL — ABNORMAL LOW (ref 8.9–10.3)
Chloride: 100 mmol/L — ABNORMAL LOW (ref 101–111)
Creatinine, Ser: 1.06 mg/dL — ABNORMAL HIGH (ref 0.44–1.00)
GFR calc non Af Amer: 47 mL/min — ABNORMAL LOW (ref >60)
GFR, EST AFRICAN AMERICAN: 54 mL/min — AB (ref >60)
GLUCOSE: 91 mg/dL (ref 65–99)
POTASSIUM: 3.6 mmol/L (ref 3.5–5.1)
SODIUM: 133 mmol/L — AB (ref 135–145)

## 2014-08-08 LAB — TSH: TSH: 0.556 u[IU]/mL (ref 0.350–4.500)

## 2014-08-08 MED ORDER — LISINOPRIL 10 MG PO TABS: 10.0000 mg | ORAL_TABLET | Freq: Every day | ORAL | Status: DC

## 2014-08-08 MED ORDER — METRONIDAZOLE 500 MG PO TABS
500.0000 mg | ORAL_TABLET | Freq: Three times a day (TID) | ORAL | Status: DC
  Administered 2014-08-08 (×2): 500 mg via ORAL
  Filled 2014-08-08 (×2): qty 1

## 2014-08-08 MED ORDER — ACETAMINOPHEN 325 MG PO TABS
650.0000 mg | ORAL_TABLET | Freq: Four times a day (QID) | ORAL | Status: DC | PRN
  Administered 2014-08-08: 650 mg via ORAL
  Filled 2014-08-08: qty 2

## 2014-08-08 NOTE — Progress Notes (Signed)
Patient with 1.55 second pause. Patient asymptomatic at time of pause. Received call from CCMD that patient was trying to go into 2 degree HB. EKG obtained and showed 1 degree AV block. Patient also with temp of 101.1. NP on call notified. New orders placed. Will continue to monitor closely.

## 2014-08-08 NOTE — Care Management Note (Signed)
Case Management Note  Patient Details  Name: KYELLE BRUSHABER MRN: [id number] Date of Birth: 03-03-1926  Subjective/Objective:    Syncope                Action/Plan:home with [facility]   Expected Discharge Date:                  Expected Discharge Plan:  Home w Home Health Services  In-House Referral:     Discharge planning Services  CM Consult  Post Acute Care Choice:    Choice offered to:  Patient  DME Arranged:    DME Agency:  [facility]  HH Arranged:  PT [facility] Agency:     Status of Service:     Medicare Important Message Given:    Date Medicare IM Given:    Medicare IM give by:    Date Additional Medicare IM Given:    Additional Medicare Important Message give by:     If discussed at Long Length of Stay Meetings, dates discussed:    Additional CommentsGeni Bers, RN 08/08/2014, 5:38 PM

## 2014-08-08 NOTE — Progress Notes (Signed)
Reviewed discharge information with patient and caregiver. Answered all questions. Patient/caregiver able to teach back medications and reasons to contact MD/911. Patient verbalizes importance of PCP follow up appointment.   

## 2014-08-08 NOTE — Evaluation (Signed)
Physical Therapy Evaluation Patient Details Name: [name] MRN: [id number] DOB: 10/10/1926 Today's Date: 08/08/2014   History of Present Illness  81 yo female admitted with diarrhea, syncope and collapse. Hx of HTn, syncope.   Clinical Impression  On eval, pt required Min assist for mobility-able to ambulate ~150 feet. Pt is weaker than baseline. LOB x1 during session. Recommend HHPT and use of cane for ambulation.     Follow Up Recommendations Home health PT;Supervision - Intermittent    Equipment Recommendations  None recommended by PT (pt has cane available at home)    Recommendations for Other Services       Precautions / Restrictions Precautions Precautions: Fall Restrictions Weight Bearing Restrictions: No      Mobility  Bed Mobility Overal bed mobility: Modified Independent                Transfers Overall transfer level: Needs assistance   Transfers: Sit to/from Stand Sit to Stand: Supervision         General transfer comment: for safety  Ambulation/Gait Ambulation/Gait assistance: Min assist Ambulation Distance (Feet): 150 Feet Assistive device: None (IV pole) Gait Pattern/deviations: Step-through pattern;Trunk flexed     General Gait Details: Began ambulation without external support however after ~75 feet, pt needed to hold onto IV pole for added stability. Fatigues fairly easily. LOB x1 right after turn/change in direction.   Stairs            Wheelchair Mobility    Modified Rankin (Stroke Patients Only)       Balance Overall balance assessment: Needs assistance;History of Falls         Standing balance support: During functional activity Standing balance-Leahy Scale: Fair                               Pertinent Vitals/Pain Pain Assessment: No/denies pain    Home Living Family/patient expects to be discharged to:: Private residence Living Arrangements: Children Available Help at Discharge:  Family Type of Home: House Home Access: Stairs to enter Entrance Stairs-Rails: Right Entrance Stairs-Number of Steps: 2-side Home Layout: One level Home Equipment: Gilmer Mor - single point      Prior Function Level of Independence: Independent with assistive device(s)   Gait / Transfers Assistance Needed: using cane for community ambulation           Hand Dominance        Extremity/Trunk Assessment   Upper Extremity Assessment: Generalized weakness           Lower Extremity Assessment: Generalized weakness      Cervical / Trunk Assessment: Kyphotic  Communication   Communication: No difficulties  Cognition Arousal/Alertness: Awake/alert Behavior During Therapy: WFL for tasks assessed/performed Overall Cognitive Status: Within Functional Limits for tasks assessed                      General Comments      Exercises        Assessment/Plan    PT Assessment Patient needs continued PT services  PT Diagnosis Difficulty walking;Generalized weakness   PT Problem List Decreased strength;Decreased activity tolerance;Decreased balance;Decreased mobility;Decreased knowledge of use of DME  PT Treatment Interventions DME instruction;Functional mobility training;Gait training;Therapeutic activities;Therapeutic exercise;Patient/family education;Balance training   PT Goals (Current goals can be found in the Care Plan section) Acute Rehab PT Goals Patient Stated Goal: home soon PT Goal Formulation: With patient Time For Goal Achievement: 08/22/14 Potential  to Achieve Goals: Good    Frequency Min 3X/week   Barriers to discharge        Co-evaluation               End of Session Equipment Utilized During Treatment: Gait belt Activity Tolerance: Patient limited by fatigue Patient left: in chair;with call bell/phone within reach           Time: 1052-1116 PT Time Calculation (min) (ACUTE ONLY): 24 min   Charges:   PT Evaluation $Initial PT  Evaluation Tier I: 1 Procedure PT Treatments $Gait Training: 8-22 mins   PT G Codes:        Rebeca Alert, MPT Pager: [phone]

## 2014-08-08 NOTE — Discharge Summary (Signed)
Physician Discharge Summary  [name]:[id number] DOB: 17-Jan-1927 DOA: 08/07/2014  PCP: Garlan Fillers, MD  Admit date: 08/07/2014 Discharge date: 08/08/2014  Time spent: > 35  minutes  Recommendations for Outpatient Follow-up:  1. Monitor blood pressures. And adjust regimen as needed 2. B blocker discontinued given bradycardia. Bradycardia resolving off of B blocker 3. Pt had diarrhea and was dehydrated initially will d/c HCTZ on d/c  Discharge Diagnoses:  Active Problems:   Hyperlipidemia   Essential hypertension   Syncope and collapse   Diarrhea   Discharge Condition: stable  Diet recommendation: low sodium  Filed Weights   08/07/14 1746  Weight: 77.111 kg (170 lb)    History of present illness:  81 y/o with HTN who presented to the hospital after syncopal episode. Was having diarrhea for 3 days prior to presentation.  Hospital Course:  Diarrhea - suspect viral etiology as such d/c off antibiotics - resolving on day of d/c - no sample collected for GI pathogen panel  Syncope - Pt had PT evaluation and they only recommended PT on d/c - resolved and most likely due to dehydration - in context of dehydrated patient continuing to take 3 blood pressure medications - no syncope or near syncope on day of d/c  HTN - discontinued B blocker due to bradycardia - held hctz due to dehydration initially - d/c on lisinopril  Procedures:  None  Consultations:  None  Discharge Exam: Filed Vitals:   08/08/14 1618  BP: 157/55  Pulse:   Temp:   Resp:     General: Pt in nad, alert and awake Cardiovascular: rrr, no mrg Respiratory: cta bl, no wheezes  Discharge Instructions   Discharge Instructions    Call MD for:  difficulty breathing, headache or visual disturbances    Complete by:  As directed      Call MD for:  extreme fatigue    Complete by:  As directed      Call MD for:  temperature >100.4    Complete by:  As directed      Diet - low  sodium heart healthy    Complete by:  As directed      Discharge instructions    Complete by:  As directed   Patient to continue home health pt on discharge.   Pt to f/u with pcp within 1 week from discharge.     Increase activity slowly    Complete by:  As directed           Current Discharge Medication List    START taking these medications   Details  lisinopril (PRINIVIL) 10 MG tablet Take 1 tablet (10 mg total) by mouth daily. Qty: 30 tablet, Refills: 0      CONTINUE these medications which have NOT CHANGED   Details  aspirin 325 MG tablet Take 325 mg by mouth daily.      b complex vitamins tablet Take 1 tablet by mouth daily.    calcium-vitamin D (OSCAL WITH D) 500-200 MG-UNIT per tablet Take 1 tablet by mouth 2 (two) times daily.     Associated Diagnoses: Unspecified essential hypertension    ergocalciferol (VITAMIN D2) 50000 UNITS capsule Take 50,000 Units by mouth every 14 (fourteen) days.    simvastatin (ZOCOR) 20 MG tablet Take 20 mg by mouth at bedtime.        STOP taking these medications     atenolol (TENORMIN) 50 MG tablet      lisinopril-hydrochlorothiazide (PRINZIDE,ZESTORETIC) 10-12.5 MG  per tablet      oxybutynin (DITROPAN) 5 MG tablet        No Known Allergies    The results of significant diagnostics from this hospitalization (including imaging, microbiology, ancillary and laboratory) are listed below for reference.    Significant Diagnostic Studies: Dg Chest Port 1 View  08/07/2014   CLINICAL DATA:  Syncopal event today  EXAM: PORTABLE CHEST - 1 VIEW  COMPARISON:  03/15/2007  FINDINGS: Cardiac shadow is enlarged. Bibasilar scarring is noted stable from the prior exam. No new focal infiltrate is seen. Mild vascular congestion is noted.  IMPRESSION: Chronic changes in the bases with mild superimposed vascular congestion.   Electronically Signed   By: Alcide Clever M.D.   On: 08/07/2014 13:45    Microbiology: No results found for this or any  previous visit (from the past 240 hour(s)).   Labs: Basic Metabolic Panel:  Recent Labs Lab 08/07/14 1150 08/07/14 1202 08/08/14 0444  NA 131*  --  133*  K 3.9  --  3.6  CL 97*  --  100*  CO2 26  --  25  GLUCOSE 104*  --  91  BUN 15  --  16  CREATININE 1.03*  --  1.06*  CALCIUM 8.8*  --  8.5*  MG  --  1.8  --   PHOS  --  3.3  --    Liver Function Tests: No results for input(s): AST, ALT, ALKPHOS, BILITOT, PROT, ALBUMIN in the last 168 hours. No results for input(s): LIPASE, AMYLASE in the last 168 hours. No results for input(s): AMMONIA in the last 168 hours. CBC:  Recent Labs Lab 08/07/14 1150 08/08/14 0444  WBC 3.0* 2.9*  NEUTROABS 1.9  --   HGB 9.5* 8.8*  HCT 29.8* 27.6*  MCV 91.7 90.8  PLT 134* 136*   Cardiac Enzymes:  Recent Labs Lab 08/07/14 1150  TROPONINI <0.03   BNP: BNP (last 3 results) No results for input(s): BNP in the last 8760 hours.  ProBNP (last 3 results) No results for input(s): PROBNP in the last 8760 hours.  CBG: No results for input(s): GLUCAP in the last 168 hours.     Signed:  Penny Pia  [facility] 08/08/2014, 4:32 PM

## 2014-08-14 LAB — CULTURE, BLOOD (ROUTINE X 2)
CULTURE: NO GROWTH
Culture: NO GROWTH

## 2014-08-16 NOTE — Progress Notes (Signed)
   08/08/14 1128  PT Time Calculation  PT Start Time (ACUTE ONLY) 1052  PT Stop Time (ACUTE ONLY) 1116  PT Time Calculation (min) (ACUTE ONLY) 24 min  PT G-Codes **NOT FOR INPATIENT CLASS**  Functional Assessment Tool Used (clinical judgement)  Functional Limitation Mobility: Walking and moving around  Mobility: Walking and Moving Around Current Status (G9924) CJ  Mobility: Walking and Moving Around Goal Status (Q6834) CI  PT General Charges  $$ ACUTE PT VISIT 1 Procedure  PT Evaluation  $Initial PT Evaluation Tier I 1 Procedure  PT Treatments  $Gait Training 8-22 mins   Rebeca Alert, MPT [phone]

## 2014-08-17 ENCOUNTER — Emergency Department (HOSPITAL_COMMUNITY): Payer: Commercial Managed Care - HMO

## 2014-08-17 ENCOUNTER — Encounter (HOSPITAL_COMMUNITY): Payer: Self-pay | Admitting: Emergency Medicine

## 2014-08-17 ENCOUNTER — Emergency Department (HOSPITAL_COMMUNITY)
Admission: EM | Admit: 2014-08-17 | Discharge: 2014-08-17 | Disposition: A | Discharge status: Home / Self Care | Payer: Commercial Managed Care - HMO | Attending: Emergency Medicine | Admitting: Emergency Medicine

## 2014-08-17 DIAGNOSIS — R42 Dizziness and giddiness: Secondary | ICD-10-CM | POA: Diagnosis not present

## 2014-08-17 DIAGNOSIS — Z7982 Long term (current) use of aspirin: Secondary | ICD-10-CM | POA: Insufficient documentation

## 2014-08-17 DIAGNOSIS — M1990 Unspecified osteoarthritis, unspecified site: Secondary | ICD-10-CM | POA: Diagnosis not present

## 2014-08-17 DIAGNOSIS — Z79899 Other long term (current) drug therapy: Secondary | ICD-10-CM | POA: Insufficient documentation

## 2014-08-17 DIAGNOSIS — R251 Tremor, unspecified: Secondary | ICD-10-CM | POA: Diagnosis present

## 2014-08-17 DIAGNOSIS — E780 Pure hypercholesterolemia: Secondary | ICD-10-CM | POA: Insufficient documentation

## 2014-08-17 DIAGNOSIS — E871 Hypo-osmolality and hyponatremia: Secondary | ICD-10-CM | POA: Diagnosis not present

## 2014-08-17 DIAGNOSIS — I10 Essential (primary) hypertension: Secondary | ICD-10-CM | POA: Diagnosis not present

## 2014-08-17 HISTORY — DX: Unspecified osteoarthritis, unspecified site: M19.90

## 2014-08-17 HISTORY — DX: Pure hypercholesterolemia, unspecified: E78.00

## 2014-08-17 LAB — BASIC METABOLIC PANEL
Anion gap: 10 (ref 5–15)
BUN: 15 mg/dL (ref 6–20)
CHLORIDE: 90 mmol/L — AB (ref 101–111)
CO2: 28 mmol/L (ref 22–32)
Calcium: 9.6 mg/dL (ref 8.9–10.3)
Creatinine, Ser: 1.02 mg/dL — ABNORMAL HIGH (ref 0.44–1.00)
GFR, EST AFRICAN AMERICAN: 56 mL/min — AB (ref >60)
GFR, EST NON AFRICAN AMERICAN: 49 mL/min — AB (ref >60)
GLUCOSE: 102 mg/dL — AB (ref 65–99)
POTASSIUM: 4.4 mmol/L (ref 3.5–5.1)
Sodium: 128 mmol/L — ABNORMAL LOW (ref 135–145)

## 2014-08-17 LAB — CBC
HEMATOCRIT: 33.3 % — AB (ref 36.0–46.0)
Hemoglobin: 10.6 g/dL — ABNORMAL LOW (ref 12.0–15.0)
MCH: 28.4 pg (ref 26.0–34.0)
MCHC: 31.8 g/dL (ref 30.0–36.0)
MCV: 89.3 fL (ref 78.0–100.0)
Platelets: 265 10*3/uL (ref 150–400)
RBC: 3.73 MIL/uL — ABNORMAL LOW (ref 3.87–5.11)
RDW: 12.5 % (ref 11.5–15.5)
WBC: 3.2 10*3/uL — ABNORMAL LOW (ref 4.0–10.5)

## 2014-08-17 LAB — CBG MONITORING, ED: Glucose-Capillary: 95 mg/dL (ref 65–99)

## 2014-08-17 MED ORDER — SODIUM CHLORIDE 0.9 % IV BOLUS (SEPSIS)
1000.0000 mL | Freq: Once | INTRAVENOUS | Status: AC
  Administered 2014-08-17: 1000 mL via INTRAVENOUS

## 2014-08-17 MED ORDER — AZITHROMYCIN 250 MG PO TABS: 250.0000 mg | ORAL_TABLET | Freq: Every day | ORAL | Status: DC

## 2014-08-17 NOTE — ED Notes (Signed)
Pt states last week she was here for diarrhea/syncope. Today pt states she just doesn't feel good, she's shaking, and has a bad taste in her mouth. Says she's currently being treated for a UTI. Pt states she's having a flare up of her arthritic back pain as well.

## 2014-08-17 NOTE — ED Notes (Signed)
Pt ambulated to the bathroom with out any assistants Rn notified

## 2014-08-17 NOTE — ED Provider Notes (Signed)
CSN: [id number]     Arrival date & time 08/17/14  1444 History   First MD Initiated Contact with Patient 08/17/14 1550     Chief Complaint  Patient presents with  . Tremors     (Consider location/radiation/quality/duration/timing/severity/associated sxs/prior Treatment) HPI  81 year old female presents for evaluation of lightheadedness. The patient has been lightheaded since she started taking tramadol 2 days ago. She saw her PCP 2 days ago for acute on chronic low back pain and was prescribed tramadol. She is lightheaded throughout the day, worse when standing up. However the lightheadedness increases the most about an hour after taking the tramadol. The patient denies a room spinning sensation or passing out during these episodes. She did pass out 2 weeks ago after having a lot of diarrhea but has not had diarrhea or passing out since. Patient feels bloated but denies abdominal pain, vomiting, or constipation/diarrhea. The patient has been having a bad taste in her mouth and some productive sputum with cough 2 weeks. No shortness of breath. She is also been having tremors for the past several weeks this has been increasing over the last 2 days. No weakness or numbness.  Past Medical History  Diagnosis Date  . Arthritis   . Hypertension   . High cholesterol    Past Surgical History  Procedure Laterality Date  . Abdominal hysterectomy    . Replacement total knee Left    No family history on file. History  Substance Use Topics  . Smoking status: Not on file  . Smokeless tobacco: Not on file  . Alcohol Use: Not on file   OB History    No data available     Review of Systems  Constitutional: Negative for fever.  Respiratory: Negative for shortness of breath.   Cardiovascular: Negative for chest pain and palpitations.  Gastrointestinal: Negative for vomiting, abdominal pain and diarrhea.  Genitourinary: Negative for dysuria.  Neurological: Positive for tremors and  light-headedness. Negative for dizziness, weakness and headaches.  All other systems reviewed and are negative.     Allergies  Review of patient's allergies indicates no known allergies.  Home Medications   Prior to Admission medications   Medication Sig Start Date End Date Taking? Authorizing Provider  aspirin 325 MG tablet Take 325 mg by mouth daily.   Yes Historical Provider, MD  calcium-vitamin D (OSCAL WITH D) 500-200 MG-UNIT per tablet Take 1 tablet by mouth 2 (two) times daily.   Yes Historical Provider, MD  atenolol (TENORMIN) 25 MG tablet TK 1 T PO HS 08/15/14   Historical Provider, MD  ciprofloxacin (CIPRO) 250 MG tablet TK 1 T PO BID WF FOR 7 DAYS 08/15/14   Historical Provider, MD  lisinopril (PRINIVIL,ZESTRIL) 10 MG tablet Take 10 mg by mouth daily.  08/16/14   Historical Provider, MD  oxybutynin (DITROPAN) 5 MG tablet Take 5 mg by mouth 2 (two) times daily.  08/02/14   Historical Provider, MD  simvastatin (ZOCOR) 20 MG tablet Take 20 mg by mouth at bedtime.  08/02/14   Historical Provider, MD  traMADol (ULTRAM) 50 MG tablet Take 50 mg by mouth every 6 (six) hours as needed for moderate pain or severe pain.  08/15/14   Historical Provider, MD  Vitamin D, Ergocalciferol, (DRISDOL) 50000 UNITS CAPS capsule TK 1 C PO Q 2 WEEKS 08/15/14   Historical Provider, MD   BP 175/60 mmHg  Pulse 70  Temp(Src) 98.1 F (36.7 C) (Oral)  Resp 18  SpO2 97% Physical Exam  Constitutional: She is oriented to person, place, and time. She appears well-developed and well-nourished.  HENT:  Head: Normocephalic and atraumatic.  Right Ear: External ear normal.  Left Ear: External ear normal.  Nose: Nose normal.  Eyes: Right eye exhibits no discharge. Left eye exhibits no discharge.  Cardiovascular: Normal rate, regular rhythm and normal heart sounds.   Pulmonary/Chest: Effort normal and breath sounds normal.  Abdominal: Soft. There is no tenderness.  Neurological: She is alert and oriented to person,  place, and time.  CN 2-12 grossly intact. 5/5 strength in all 4 extremities. Normal sensation. Mild intention tremor intermittently  Skin: Skin is warm and dry.  Nursing note and vitals reviewed.   ED Course  Procedures (including critical care time) Labs Review Labs Reviewed  CBC - Abnormal; Notable for the following:    WBC 3.2 (*)    RBC 3.73 (*)    Hemoglobin 10.6 (*)    HCT 33.3 (*)    All other components within normal limits  BASIC METABOLIC PANEL - Abnormal; Notable for the following:    Sodium 128 (*)    Chloride 90 (*)    Glucose, Bld 102 (*)    Creatinine, Ser 1.02 (*)    GFR calc non Af Amer 49 (*)    GFR calc Af Amer 56 (*)    All other components within normal limits  CBG MONITORING, ED    Imaging Review Dg Chest 2 View  08/17/2014   CLINICAL DATA:  Cough, diarrhea, syncope  EXAM: CHEST  2 VIEW  COMPARISON:  None.  FINDINGS: There are coarse opacities at both lung bases some of which could be chronic, but active pneumonia is a definite consideration. No definite effusion is seen. Mediastinal and hilar contours are unremarkable. There is cardiomegaly present. The bones are osteopenic.  IMPRESSION: Coarse lung markings bilaterally at the lung bases may represent pneumonia. Pulmonary fibrosis cannot be excluded. Recommend followup.   Electronically Signed   By: Dwyane Dee M.D.   On: 08/17/2014 17:04     EKG Interpretation   Date/Time:  Wednesday August 17 2014 17:06:37 EDT Ventricular Rate:  53 PR Interval:  322 QRS Duration: 96 QT Interval:  454 QTC Calculation: 426 R Axis:   14 Text Interpretation:  Sinus or ectopic atrial rhythm Prolonged PR interval  Probable anteroseptal infarct, recent Baseline wander in lead(s) V6 No old  tracing to compare Confirmed by Travez Stancil  MD, Eyvonne Burchfield ([id number]) on 08/17/2014  5:34:08 PM      MDM   Final diagnoses:  Lightheadedness  Hyponatremia    Patient's lightheadedness appears to be due to her tramadol. There are no old  records to compare to but patient states that she is chronically bradycardic and thus this does not seem like a likely cause for lightheadedness. She is also not hypotensive. The patient also endorses that she is chronically anemic and I doubt a hemoglobin of 10 was cauterized to be significantly lightheaded. She's had a cough although this is improving and almost gone. X-ray shows coarse lung markings that could be pneumonia but is more likely fibrosis. She is not febrile, hypoxic, or having increased work of breathing, given her comorbidities I will cover her for possible pneumonia L recommend she follow up with PCP for repeat x-ray. Her mild hyponatremia is of uncertain etiology or significance, recommend close f/u with PCP for recheck.     Pricilla Loveless, MD 08/17/14 [phone]

## 2014-08-26 ENCOUNTER — Encounter (HOSPITAL_COMMUNITY): Payer: Self-pay | Admitting: Emergency Medicine

## 2014-11-30 ENCOUNTER — Other Ambulatory Visit: Payer: Self-pay

## 2014-11-30 DIAGNOSIS — Z1231 Encounter for screening mammogram for malignant neoplasm of breast: Secondary | ICD-10-CM

## 2015-01-09 ENCOUNTER — Ambulatory Visit
Admission: RE | Admit: 2015-01-09 | Discharge: 2015-01-09 | Disposition: A | Discharge status: Home / Self Care | Payer: Commercial Managed Care - HMO | Source: Ambulatory Visit

## 2015-01-09 DIAGNOSIS — Z1231 Encounter for screening mammogram for malignant neoplasm of breast: Secondary | ICD-10-CM

## 2015-07-28 ENCOUNTER — Telehealth: Payer: Self-pay | Admitting: Internal Medicine

## 2015-07-28 DIAGNOSIS — R42 Dizziness and giddiness: Secondary | ICD-10-CM

## 2015-07-28 DIAGNOSIS — R001 Bradycardia, unspecified: Secondary | ICD-10-CM

## 2015-07-28 DIAGNOSIS — R5383 Other fatigue: Secondary | ICD-10-CM

## 2015-07-28 NOTE — Telephone Encounter (Signed)
[name] is calling to request records on patient for review by Dr Ladona Ridgelaylor

## 2015-07-28 NOTE — Telephone Encounter (Signed)
New message     Pt c/o BP issue: STAT if pt c/o blurred vision, one-sided weakness or slurred speech  1. What are your last 5 BP readings? 180/78  HR 52  2. Are you having any other symptoms (ex. Dizziness, headache, blurred vision, passed out)? lightheaded  3. What is your BP issue? Bradycardia/ Dr.Patterson sees pt and wants an appt in a week with Ladona Ridgelaylor, positive tilt table test

## 2015-07-31 NOTE — Telephone Encounter (Signed)
Dr Ladona Ridgelaylor reviewed records and wants the patient to wear a 48 hour monitor and follow up after monitor.  Will order and have Melissa call and schedule both monitor and follow up appointment

## 2015-08-01 ENCOUNTER — Encounter: Payer: Self-pay | Admitting: Internal Medicine

## 2015-08-01 ENCOUNTER — Ambulatory Visit (INDEPENDENT_AMBULATORY_CARE_PROVIDER_SITE_OTHER): Payer: Commercial Managed Care - HMO | Admitting: Internal Medicine

## 2015-08-01 VITALS — BP 200/80 | HR 55 | Ht 62.0 in | Wt 155.4 lb

## 2015-08-01 DIAGNOSIS — R001 Bradycardia, unspecified: Secondary | ICD-10-CM

## 2015-08-01 DIAGNOSIS — I119 Hypertensive heart disease without heart failure: Secondary | ICD-10-CM

## 2015-08-01 MED ORDER — NIFEDIPINE ER OSMOTIC RELEASE 30 MG PO TB24: 30.0000 mg | ORAL_TABLET | Freq: Every day | ORAL | Status: DC

## 2015-08-01 NOTE — Progress Notes (Signed)
HPI Mrs. Engebretsen returns today after a 3.5 year absence from our clinic. She is a very pleasant 82 year old woman with a history of hypertension and unexplained syncope. She is status post insertion of an implantable loop recorder. Her device has reached elective replacement and was removed. The patient has had no recurrent syncope. She denies chest pain or shortness of breath. She does note some dietary indiscretion with sodium. She has lost weight over the past 4 years. She was bradycardic on atenolol and this was stopped. She was placed on Losartan HCTZ and her HR improved. Her blood pressure remains elevated. No Known Allergies   Current Outpatient Prescriptions  Medication Sig Dispense Refill  . aspirin EC 81 MG tablet Take 81 mg by mouth daily.    . calcium-vitamin D (OSCAL WITH D) 500-200 MG-UNIT per tablet Take 1 tablet by mouth 2 (two) times daily.    . Cyanocobalamin (VITAMIN B-12 PO) Take 1 tablet by mouth daily.    . ergocalciferol (VITAMIN D2) 50000 UNITS capsule Take 50,000 Units by mouth every 14 (fourteen) days.    Marland Kitchen losartan-hydrochlorothiazide (HYZAAR) 50-12.5 MG tablet Take 1 tablet by mouth daily.  0  . metoCLOPramide (REGLAN) 5 MG tablet Take 1 tablet by mouth daily with supper.  6  . oxybutynin (DITROPAN) 5 MG tablet Take 5 mg by mouth at bedtime.     . simvastatin (ZOCOR) 20 MG tablet Take 20 mg by mouth at bedtime.      No current facility-administered medications for this visit.     Past Medical History  Diagnosis Date  . Dyslipidemia   . Syncope   . S/P hysterectomy   . Arthritis   . Hypertension   . High cholesterol     ROS:   All systems reviewed and negative except as noted in the HPI.   Past Surgical History  Procedure Laterality Date  . Status post knee surgery    . Implantation of implantable loop recorder    . Loop recorder explant N/A 03/13/2012    Procedure: LOOP RECORDER EXPLANT;  Surgeon: Marinus Maw, MD;  Location: [facility] CATH LAB;  Service:  Cardiovascular;  Laterality: N/A;  . Abdominal hysterectomy    . Replacement total knee Left      Family History  Problem Relation Age of Onset  . Diabetes Other      Social History   Social History  . Marital Status: Unknown    Spouse Name: N/A  . Number of Children: 2  . Years of Education: N/A   Occupational History  . 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     Social History Main Topics  . Smoking status: Never Smoker   . Smokeless tobacco: Not on file  . Alcohol Use: No  . Drug Use: No  . Sexual Activity: Not on file   Other Topics Concern  . Not on file   Social History Narrative   ** Merged History Encounter **         BP 200/80 mmHg  Pulse 55  Ht  (1.575 m)  Wt 155 lb 6.4 oz (70.489 kg)  BMI 28.42 kg/m2 BP= 180/75 by me Physical Exam:  Well appearing elderly woman, NAD HEENT: Unremarkable Neck:  7 cm JVD, no thyromegally Lungs:  Clear with no wheezes, rales, or rhonchi. HEART:  Regular rate rhythm, no murmurs, no rubs, no clicks, S4 gallop is present Abd:  soft, positive bowel sounds, no organomegally, no rebound,  no guarding Ext:  2 plus pulses, no edema, no cyanosis, no clubbing Skin:  No rashes no nodules Neuro:  CN II through XII intact, motor grossly intact  EKG Normal sinus rhythm with sinus bradycardia and first-degree AV block and AVWB   Assess/Plan:  1. Syncope - she has had no recurrent episodes. She will undergo watchful waiting 2. AVWB - she is asymptomatic. Will try to avoid AV nodal blocking drugs. 3. Uncontrolled HTN - her blood pressure is elevated. I will add low dose procardia XL to her regimen. She will follow up with her primary MD.  Leonia ReevesGregg Martavius Lusty,M.D.

## 2015-08-01 NOTE — Patient Instructions (Signed)
Medication Instructions:  Your physician has recommended you make the following change in your medication:  1) Start Procardia XL 30 mg daily   Labwork: None ordered   Testing/Procedures: None ordered   Follow-Up: Your physician wants you to follow-up in: 12 months with Dr Court Joyaylor You will receive a reminder letter in the mail two months in advance. If you don't receive a letter, please call our office to schedule the follow-up appointment.   Any Other Special Instructions Will Be Listed Below (If Applicable).     If you need a refill on your cardiac medications before your next appointment, please call your pharmacy.

## 2015-11-21 ENCOUNTER — Inpatient Hospital Stay (HOSPITAL_COMMUNITY)
Admission: EM | Admit: 2015-11-21 | Discharge: 2015-11-24 | DRG: 243 | Disposition: A | Discharge status: Home / Self Care | Payer: Commercial Managed Care - HMO | Attending: Cardiology | Admitting: Cardiology

## 2015-11-21 ENCOUNTER — Emergency Department (HOSPITAL_COMMUNITY): Payer: Commercial Managed Care - HMO

## 2015-11-21 ENCOUNTER — Encounter (HOSPITAL_COMMUNITY): Payer: Self-pay

## 2015-11-21 DIAGNOSIS — I1310 Hypertensive heart and chronic kidney disease without heart failure, with stage 1 through stage 4 chronic kidney disease, or unspecified chronic kidney disease: Secondary | ICD-10-CM | POA: Diagnosis present

## 2015-11-21 DIAGNOSIS — I442 Atrioventricular block, complete: Principal | ICD-10-CM

## 2015-11-21 DIAGNOSIS — J849 Interstitial pulmonary disease, unspecified: Secondary | ICD-10-CM | POA: Diagnosis present

## 2015-11-21 DIAGNOSIS — Z96652 Presence of left artificial knee joint: Secondary | ICD-10-CM | POA: Diagnosis present

## 2015-11-21 DIAGNOSIS — R9431 Abnormal electrocardiogram [ECG] [EKG]: Secondary | ICD-10-CM | POA: Diagnosis not present

## 2015-11-21 DIAGNOSIS — N183 Chronic kidney disease, stage 3 (moderate): Secondary | ICD-10-CM | POA: Diagnosis present

## 2015-11-21 DIAGNOSIS — D649 Anemia, unspecified: Secondary | ICD-10-CM | POA: Diagnosis present

## 2015-11-21 DIAGNOSIS — R55 Syncope and collapse: Secondary | ICD-10-CM | POA: Diagnosis present

## 2015-11-21 DIAGNOSIS — R001 Bradycardia, unspecified: Secondary | ICD-10-CM | POA: Diagnosis not present

## 2015-11-21 DIAGNOSIS — Z79899 Other long term (current) drug therapy: Secondary | ICD-10-CM

## 2015-11-21 DIAGNOSIS — I739 Peripheral vascular disease, unspecified: Secondary | ICD-10-CM | POA: Diagnosis present

## 2015-11-21 DIAGNOSIS — R009 Unspecified abnormalities of heart beat: Secondary | ICD-10-CM | POA: Diagnosis present

## 2015-11-21 DIAGNOSIS — E7800 Pure hypercholesterolemia, unspecified: Secondary | ICD-10-CM | POA: Diagnosis present

## 2015-11-21 DIAGNOSIS — Z90710 Acquired absence of both cervix and uterus: Secondary | ICD-10-CM | POA: Diagnosis not present

## 2015-11-21 DIAGNOSIS — Z7982 Long term (current) use of aspirin: Secondary | ICD-10-CM | POA: Diagnosis not present

## 2015-11-21 DIAGNOSIS — M1711 Unilateral primary osteoarthritis, right knee: Secondary | ICD-10-CM | POA: Diagnosis present

## 2015-11-21 DIAGNOSIS — I441 Atrioventricular block, second degree: Secondary | ICD-10-CM | POA: Diagnosis not present

## 2015-11-21 DIAGNOSIS — D709 Neutropenia, unspecified: Secondary | ICD-10-CM | POA: Diagnosis present

## 2015-11-21 DIAGNOSIS — I119 Hypertensive heart disease without heart failure: Secondary | ICD-10-CM | POA: Diagnosis present

## 2015-11-21 DIAGNOSIS — Z959 Presence of cardiac and vascular implant and graft, unspecified: Secondary | ICD-10-CM

## 2015-11-21 LAB — FERRITIN: FERRITIN: 48 ng/mL (ref 11–307)

## 2015-11-21 LAB — IRON AND TIBC
IRON: 29 ug/dL (ref 28–170)
Saturation Ratios: 12 % (ref 10.4–31.8)
TIBC: 251 ug/dL (ref 250–450)
UIBC: 222 ug/dL

## 2015-11-21 LAB — RETICULOCYTES
RBC.: 3.12 MIL/uL — ABNORMAL LOW (ref 3.87–5.11)
RETIC COUNT ABSOLUTE: 25 10*3/uL (ref 19.0–186.0)
Retic Ct Pct: 0.8 % (ref 0.4–3.1)

## 2015-11-21 LAB — BASIC METABOLIC PANEL
Anion gap: 7 (ref 5–15)
BUN: 22 mg/dL — AB (ref 6–20)
CALCIUM: 9.5 mg/dL (ref 8.9–10.3)
CHLORIDE: 102 mmol/L (ref 101–111)
CO2: 28 mmol/L (ref 22–32)
CREATININE: 1.27 mg/dL — AB (ref 0.44–1.00)
GFR calc Af Amer: 43 mL/min — ABNORMAL LOW (ref >60)
GFR calc non Af Amer: 37 mL/min — ABNORMAL LOW (ref >60)
GLUCOSE: 100 mg/dL — AB (ref 65–99)
Potassium: 3.8 mmol/L (ref 3.5–5.1)
Sodium: 137 mmol/L (ref 135–145)

## 2015-11-21 LAB — COMPREHENSIVE METABOLIC PANEL
ALT: 12 U/L — AB (ref 14–54)
AST: 23 U/L (ref 15–41)
Albumin: 3.4 g/dL — ABNORMAL LOW (ref 3.5–5.0)
Alkaline Phosphatase: 75 U/L (ref 38–126)
Anion gap: 6 (ref 5–15)
BUN: 19 mg/dL (ref 6–20)
CHLORIDE: 102 mmol/L (ref 101–111)
CO2: 27 mmol/L (ref 22–32)
CREATININE: 1.13 mg/dL — AB (ref 0.44–1.00)
Calcium: 9.4 mg/dL (ref 8.9–10.3)
GFR, EST AFRICAN AMERICAN: 49 mL/min — AB (ref >60)
GFR, EST NON AFRICAN AMERICAN: 42 mL/min — AB (ref >60)
Glucose, Bld: 126 mg/dL — ABNORMAL HIGH (ref 65–99)
POTASSIUM: 4.3 mmol/L (ref 3.5–5.1)
SODIUM: 135 mmol/L (ref 135–145)
Total Bilirubin: 0.4 mg/dL (ref 0.3–1.2)
Total Protein: 7.3 g/dL (ref 6.5–8.1)

## 2015-11-21 LAB — CBC
HCT: 30.4 % — ABNORMAL LOW (ref 36.0–46.0)
HEMATOCRIT: 29.5 % — AB (ref 36.0–46.0)
HEMOGLOBIN: 9.1 g/dL — AB (ref 12.0–15.0)
Hemoglobin: 9.9 g/dL — ABNORMAL LOW (ref 12.0–15.0)
MCH: 29.7 pg (ref 26.0–34.0)
MCH: 29.2 pg (ref 26.0–34.0)
MCHC: 32.6 g/dL (ref 30.0–36.0)
MCHC: 30.8 g/dL (ref 30.0–36.0)
MCV: 91.3 fL (ref 78.0–100.0)
MCV: 94.6 fL (ref 78.0–100.0)
PLATELETS: 167 10*3/uL (ref 150–400)
Platelets: 138 10*3/uL — ABNORMAL LOW (ref 150–400)
RBC: 3.33 MIL/uL — ABNORMAL LOW (ref 3.87–5.11)
RBC: 3.12 MIL/uL — ABNORMAL LOW (ref 3.87–5.11)
RDW: 13.1 % (ref 11.5–15.5)
RDW: 13.1 % (ref 11.5–15.5)
WBC: 3.9 10*3/uL — ABNORMAL LOW (ref 4.0–10.5)
WBC: 3 10*3/uL — AB (ref 4.0–10.5)

## 2015-11-21 LAB — FOLATE: FOLATE: 13.8 ng/mL (ref >5.9)

## 2015-11-21 LAB — TROPONIN I

## 2015-11-21 LAB — TSH
TSH: 0.863 u[IU]/mL (ref 0.350–4.500)
TSH: 0.68 u[IU]/mL (ref 0.350–4.500)

## 2015-11-21 LAB — I-STAT TROPONIN, ED: Troponin i, poc: 0.01 ng/mL (ref 0.00–0.08)

## 2015-11-21 LAB — MAGNESIUM: Magnesium: 1.8 mg/dL (ref 1.7–2.4)

## 2015-11-21 LAB — VITAMIN B12: Vitamin B-12: 1799 pg/mL — ABNORMAL HIGH (ref 180–914)

## 2015-11-21 MED ORDER — PNEUMOCOCCAL VAC POLYVALENT 25 MCG/0.5ML IJ INJ: 0.5000 mL | INJECTION | INTRAMUSCULAR | Status: DC | PRN

## 2015-11-21 MED ORDER — ACETAMINOPHEN 325 MG PO TABS
650.0000 mg | ORAL_TABLET | Freq: Two times a day (BID) | ORAL | Status: DC
  Administered 2015-11-21 – 2015-11-22 (×3): 650 mg via ORAL
  Filled 2015-11-21 (×3): qty 2

## 2015-11-21 MED ORDER — LOSARTAN POTASSIUM-HCTZ 50-12.5 MG PO TABS: 1.0000 | ORAL_TABLET | Freq: Every day | ORAL | Status: DC

## 2015-11-21 MED ORDER — METOCLOPRAMIDE HCL 5 MG PO TABS
5.0000 mg | ORAL_TABLET | Freq: Every day | ORAL | Status: DC
  Administered 2015-11-22 – 2015-11-23 (×2): 5 mg via ORAL
  Filled 2015-11-21 (×2): qty 1

## 2015-11-21 MED ORDER — ACETAMINOPHEN 325 MG PO TABS: 650.0000 mg | ORAL_TABLET | ORAL | Status: DC | PRN

## 2015-11-21 MED ORDER — ASPIRIN EC 81 MG PO TBEC
81.0000 mg | DELAYED_RELEASE_TABLET | Freq: Every day | ORAL | Status: DC
  Administered 2015-11-22 – 2015-11-23 (×2): 81 mg via ORAL
  Filled 2015-11-21 (×2): qty 1

## 2015-11-21 MED ORDER — ONDANSETRON HCL 4 MG/2ML IJ SOLN: 4.0000 mg | Freq: Four times a day (QID) | INTRAMUSCULAR | Status: DC | PRN

## 2015-11-21 MED ORDER — CYANOCOBALAMIN 500 MCG PO TABS
500.0000 ug | ORAL_TABLET | Freq: Every day | ORAL | Status: DC
  Administered 2015-11-23 – 2015-11-24 (×2): 500 ug via ORAL
  Filled 2015-11-21 (×3): qty 1

## 2015-11-21 MED ORDER — LOSARTAN POTASSIUM 50 MG PO TABS
50.0000 mg | ORAL_TABLET | Freq: Every day | ORAL | Status: DC
  Administered 2015-11-22 – 2015-11-24 (×3): 50 mg via ORAL
  Filled 2015-11-21 (×3): qty 1

## 2015-11-21 MED ORDER — CALCIUM CARBONATE-VITAMIN D 500-200 MG-UNIT PO TABS
1.0000 | ORAL_TABLET | Freq: Two times a day (BID) | ORAL | Status: DC
  Administered 2015-11-21 – 2015-11-24 (×5): 1 via ORAL
  Filled 2015-11-21 (×5): qty 1

## 2015-11-21 MED ORDER — ASPIRIN EC 81 MG PO TBEC: 81.0000 mg | DELAYED_RELEASE_TABLET | Freq: Every day | ORAL | Status: DC

## 2015-11-21 MED ORDER — SIMVASTATIN 20 MG PO TABS
20.0000 mg | ORAL_TABLET | Freq: Every day | ORAL | Status: DC
  Administered 2015-11-21 – 2015-11-23 (×3): 20 mg via ORAL
  Filled 2015-11-21 (×3): qty 1

## 2015-11-21 MED ORDER — POLYVINYL ALCOHOL 1.4 % OP SOLN
1.0000 [drp] | Freq: Two times a day (BID) | OPHTHALMIC | Status: DC
  Administered 2015-11-21 – 2015-11-24 (×5): 1 [drp] via OPHTHALMIC
  Filled 2015-11-21 (×3): qty 15

## 2015-11-21 MED ORDER — PANTOPRAZOLE SODIUM 40 MG PO TBEC
40.0000 mg | DELAYED_RELEASE_TABLET | Freq: Every day | ORAL | Status: DC
  Administered 2015-11-22 – 2015-11-24 (×3): 40 mg via ORAL
  Filled 2015-11-21 (×3): qty 1

## 2015-11-21 MED ORDER — OXYBUTYNIN CHLORIDE 5 MG PO TABS
5.0000 mg | ORAL_TABLET | Freq: Every day | ORAL | Status: DC
  Administered 2015-11-21 – 2015-11-23 (×3): 5 mg via ORAL
  Filled 2015-11-21 (×3): qty 1

## 2015-11-21 MED ORDER — DOCUSATE SODIUM 100 MG PO CAPS: 100.0000 mg | ORAL_CAPSULE | Freq: Every day | ORAL | Status: DC | PRN

## 2015-11-21 MED ORDER — NIFEDIPINE ER OSMOTIC RELEASE 30 MG PO TB24
30.0000 mg | ORAL_TABLET | Freq: Every day | ORAL | Status: DC
  Administered 2015-11-21 – 2015-11-23 (×3): 30 mg via ORAL
  Filled 2015-11-21 (×3): qty 1

## 2015-11-21 MED ORDER — HYDROCHLOROTHIAZIDE 12.5 MG PO CAPS
12.5000 mg | ORAL_CAPSULE | Freq: Every day | ORAL | Status: DC
  Administered 2015-11-22 – 2015-11-24 (×3): 12.5 mg via ORAL
  Filled 2015-11-21 (×3): qty 1

## 2015-11-21 MED ORDER — ENOXAPARIN SODIUM 30 MG/0.3ML ~~LOC~~ SOLN
30.0000 mg | SUBCUTANEOUS | Status: DC
  Administered 2015-11-21 – 2015-11-22 (×2): 30 mg via SUBCUTANEOUS
  Filled 2015-11-21 (×2): qty 0.3

## 2015-11-21 NOTE — ED Notes (Signed)
[facility] Called 

## 2015-11-21 NOTE — Plan of Care (Signed)
Problem: Safety: Goal: Ability to remain free from injury will improve Outcome: Progressing Reviewed the call light system and the phone and white board with numbers to contact RN/NT directly, instructed on hospital policy for safety including to call for assistance and not to get OOB by herself and patient verbalized understanding. Pt has her personal items on bedside stand beside her bed and within her reach.

## 2015-11-21 NOTE — H&P (Signed)
History and Physical   Admit date: 11/21/2015 Name:  [name] Medical record number: [id number] DOB/Age:  1926-12-08  82 y.o. female  Referring Physician:  [facility]  Primary Cardiologist:  Dr. Ladona Ridgelaylor  Primary Physician:   Dr. Dossie Arbouran Paterson  Chief complaint/reason for admission: Dizziness and low heart rate  HPI:  This 82 year old female has a history of syncope and has had a previous loop recorder.  She never had bradycardia arrhythmias documented enough to require a pacemaker and had the loop monitor explanted.  She previously had been bradycardic on atenolol that was discontinued.  She has had hypertension that has been elevated for some time.  She normally gets along fairly well.  She had an episode of diaphoresis and near syncope today but did not pass out.  She stated that she felt funny and [facility].  EKG there showed a heart rate of 38 and some episodes of what looks like either complete heart block or high degree second degree AV block.  Junctional escape rhythms were noted.  She has not been on AV nodal blocking agents.  Troponin was negative and she was transferred here for further evaluation.  She currently feels reasonably well.  She denies shortness of breath, chest pain or tightness in her chest.   Past Medical History:  Diagnosis Date  . Arthritis   . Bradycardia   . Dyslipidemia   . High cholesterol   . Hypertension   . S/P hysterectomy   . Syncope      Past Surgical History:  Procedure Laterality Date  . ABDOMINAL HYSTERECTOMY    . implantation of implantable loop recorder    . LOOP RECORDER EXPLANT N/A 03/13/2012   Procedure: LOOP RECORDER EXPLANT;  Surgeon: Marinus MawGregg W Taylor, MD;  [facility];  Service: Cardiovascular;  Laterality: N/A;  . REPLACEMENT TOTAL KNEE Left    Allergies: has No Known Allergies.   Medications: Prior to Admission medications   Medication Sig Start Date End Date Taking?  Authorizing Provider  acetaminophen (TYLENOL) 650 MG CR tablet Take 650 mg by mouth 2 (two) times daily.   Yes Historical Provider, MD  aspirin EC 81 MG tablet Take 81 mg by mouth daily with breakfast.    Yes Historical Provider, MD  calcium-vitamin D (OSCAL WITH D) 500-200 MG-UNIT per tablet Take 1 tablet by mouth 2 (two) times daily.   Yes Historical Provider, MD  docusate sodium (COLACE) 100 MG capsule Take 100 mg by mouth daily as needed for mild constipation.   Yes Historical Provider, MD  ergocalciferol (VITAMIN D2) 50000 UNITS capsule Take 50,000 Units by mouth every 14 (fourteen) days.   Yes Historical Provider, MD  Influenza Vac Split Quad (FLUZONE) 0.25 ML injection Inject 0.25 mLs into the muscle once.   Yes Historical Provider, MD  losartan-hydrochlorothiazide (HYZAAR) 50-12.5 MG tablet Take 1 tablet by mouth daily with breakfast.  07/21/15  Yes Historical Provider, MD  metoCLOPramide (REGLAN) 5 MG tablet Take 1 tablet by mouth daily with supper. 07/29/15  Yes Historical Provider, MD  NIFEdipine (PROCARDIA-XL/ADALAT-CC/NIFEDICAL-XL) 30 MG 24 hr tablet Take 1 tablet (30 mg total) by mouth daily. Patient taking differently: Take 30 mg by mouth at bedtime.  08/01/15  Yes Marinus MawGregg W Taylor, MD  oxybutynin (DITROPAN) 5 MG tablet Take 5 mg by mouth at bedtime.  08/02/14  Yes Historical Provider, MD  pantoprazole (PROTONIX) 40 MG tablet Take 40 mg by mouth daily with  breakfast.   Yes Historical Provider, MD  Polyethyl Glycol-Propyl Glycol (SYSTANE) 0.4-0.3 % SOLN Place 1 drop into both eyes 2 (two) times daily.   Yes Historical Provider, MD  simvastatin (ZOCOR) 20 MG tablet Take 20 mg by mouth at bedtime.  08/02/14  Yes Historical Provider, MD  vitamin B-12 (CYANOCOBALAMIN) 500 MCG tablet Take 500 mcg by mouth daily with breakfast.   Yes Historical Provider, MD   Family History:  Mother and father are deceased, no premature cardiac family history  Social History:   reports that she has never smoked.  She has never used smokeless tobacco. She reports that she does not drink alcohol or use drugs.   Social History   Social History Narrative            Review of Systems: She has arthritis involves her knee and complains of some mild swelling of her ankles.  No claudication, no GI problems and history of bleeding, was noted to be mildly anemic on admission.  No history of stroke or TIA. Other than as noted above, the remainder of the review of systems is normal  Physical Exam: BP (!) 167/59 (BP Location: Left Arm)   Pulse (!) 54   Temp 98.1 F (36.7 C) (Oral)   Resp 16   Ht 5\' 2"  (1.575 m)   Wt 69.3 kg (152 lb 11.2 oz)   SpO2 93%   BMI 27.93 kg/m   General appearance: Pleasant mildly obese black female in no acute distress appearing younger than stated age Head: Normocephalic, without obvious abnormality, atraumatic Eyes: conjunctivae/corneas clear. PERRL, EOM's intact. Fundi not examined  Neck: no adenopathy, no carotid bruit, no JVD and supple, symmetrical, trachea midline Lungs: clear to auscultation bilaterally Heart: Slow rhythm with slight irregularity, normal S1 and S2, soft 1 to 2/6 systolic murmur at the left sternal border Abdomen: soft, non-tender; bowel sounds normal; no masses,  no organomegaly Pelvic: deferred Extremities: Extremities normal, atraumatic, 1+ edema noted Pulses: Pedal pulses are difficult to feel and have to be dopplered, carotids normal upstroke, 1-2+ femorals Skin: Skin color, texture, turgor normal. No rashes or lesions Neurologic: Grossly normal  Labs: CBC  Recent Labs  11/21/15 1722  WBC 3.9*  RBC 3.33*  HGB 9.9*  HCT 30.4*  PLT 167  MCV 91.3  MCH 29.7  MCHC 32.6  RDW 13.1   CMP   Recent Labs  11/21/15 1722  NA 137  K 3.8  CL 102  CO2 28  GLUCOSE 100*  BUN 22*  CREATININE 1.27*  CALCIUM 9.5  GFRNONAA 37*  GFRAA 43*   Cardiac Panel (last 3 results) Troponin (Point of Care Test)  Recent Labs  11/21/15 1737   TROPIPOC 0.01   EKG: Complete heart block versus high degree second degree AV block with junctional escape rhythm  Radiology: Reactive enlargement, diffuse basilar reticular nodule infiltrates   IMPRESSIONS: 1.  Presyncope and diaphoresis with either complete heart block or high degree AV block with junctional escape rhythm 2.  Hypertensive heart disease 3.  Interstitial lung disease 4.  Anemia 5.  Stage III chronic kidney disease 6.  Peripheral vascular disease with reduced distal pulses  PLAN: Looks like that it is time for a pacemaker but will defer to electrophysiology.  Keep nothing by mouth after midnight for possible pacer insertion tomorrow  Monitor on telemetry overnight. Anemia evaluation and check CBC in morning   Signed: W. Ashley Royalty MD [facility]  11/21/2015, 8:07 PM

## 2015-11-21 NOTE — ED Notes (Addendum)
Family at bedside.  Pt and family member aware pt is being [facility] to [facility]

## 2015-11-21 NOTE — Plan of Care (Signed)
Problem: Education: Goal: Knowledge of disease or condition will improve Outcome: Progressing Reviewed pacemaker insertion and gave handout, all questions answered, will continue to monitor

## 2015-11-21 NOTE — ED Provider Notes (Signed)
[facility]-EMERGENCY DEPT Provider Note   CSN: [id number] Arrival date & time: 11/21/15  1628     History   Chief Complaint Chief Complaint  Patient presents with  . DIAPHORETIC    HPI KATEE WENTLAND is a 82 y.o. female.  HPI  Pt with hx of htn, syncope, bradycardia presenting with c/o feeling weak and diaphoretic.  She states she did not feel faint, has no chest pain.  No difficulty breathing.  Family states she started sweating and they were concerned.  Currently she has no complaints.  Symptoms began today while she was eating lunch.  She states ' I just didn't feel right".  There are no other associated systemic symptoms, there are no other alleviating or modifying factors.   Per chart review, she has a hx of implantable loop recorder that was negative and removed.  There are no other associated systemic symptoms, there are no other alleviating or modifying factors.   Past Medical History:  Diagnosis Date  . Arthritis   . Bradycardia   . Dyslipidemia   . High cholesterol   . Hypertension   . S/P hysterectomy   . Syncope     Patient Active Problem List   Diagnosis Date Noted  . Bradycardia 11/21/2015  . Complete heart block (HCC) 11/21/2015  . Peripheral vascular disease (HCC) 11/21/2015  . Anemia 11/21/2015  . Hyperlipidemia 07/15/2008  . Hypertensive heart disease     Past Surgical History:  Procedure Laterality Date  . ABDOMINAL HYSTERECTOMY    . EP IMPLANTABLE DEVICE N/A 11/23/2015   Procedure: Pacemaker Implant;  Surgeon: Hillis Range, MD;  Location: [facility] INVASIVE CV LAB;  Service: Cardiovascular;  Laterality: N/A;  . implantation of implantable loop recorder    . LOOP RECORDER EXPLANT N/A 03/13/2012   Procedure: LOOP RECORDER EXPLANT;  Surgeon: Marinus Maw, MD;  Location: [facility] CATH LAB;  Service: Cardiovascular;  Laterality: N/A;  . REPLACEMENT TOTAL KNEE Left     OB History    Gravida Para Term Preterm AB Living   0 0 0 0 0     SAB TAB Ectopic Multiple Live  Births   0 0 0           Home Medications    Prior to Admission medications   Medication Sig Start Date End Date Taking? Authorizing Provider  acetaminophen (TYLENOL) 650 MG CR tablet Take 650 mg by mouth 2 (two) times daily.   Yes Historical Provider, MD  aspirin EC 81 MG tablet Take 81 mg by mouth daily with breakfast.    Yes Historical Provider, MD  calcium-vitamin D (OSCAL WITH D) 500-200 MG-UNIT per tablet Take 1 tablet by mouth 2 (two) times daily.   Yes Historical Provider, MD  docusate sodium (COLACE) 100 MG capsule Take 100 mg by mouth daily as needed for mild constipation.   Yes Historical Provider, MD  ergocalciferol (VITAMIN D2) 50000 UNITS capsule Take 50,000 Units by mouth every 14 (fourteen) days.   Yes Historical Provider, MD  losartan-hydrochlorothiazide (HYZAAR) 50-12.5 MG tablet Take 1 tablet by mouth daily with breakfast.  07/21/15  Yes Historical Provider, MD  metoCLOPramide (REGLAN) 5 MG tablet Take 1 tablet by mouth daily with supper. 07/29/15  Yes Historical Provider, MD  NIFEdipine (PROCARDIA-XL/ADALAT-CC/NIFEDICAL-XL) 30 MG 24 hr tablet Take 1 tablet (30 mg total) by mouth daily. Patient taking differently: Take 30 mg by mouth at bedtime.  08/01/15  Yes Marinus Maw, MD  oxybutynin (DITROPAN) 5 MG tablet Take 5  mg by mouth at bedtime.  08/02/14  Yes Historical Provider, MD  pantoprazole (PROTONIX) 40 MG tablet Take 40 mg by mouth daily with breakfast.   Yes Historical Provider, MD  Polyethyl Glycol-Propyl Glycol (SYSTANE) 0.4-0.3 % SOLN Place 1 drop into both eyes 2 (two) times daily.   Yes Historical Provider, MD  simvastatin (ZOCOR) 20 MG tablet Take 20 mg by mouth at bedtime.  08/02/14  Yes Historical Provider, MD  vitamin B-12 (CYANOCOBALAMIN) 500 MCG tablet Take 500 mcg by mouth daily with breakfast.   Yes Historical Provider, MD    Family History Family History  Problem Relation Age of Onset  . Diabetes Other     Social History Social History  Substance Use  Topics  . Smoking status: Never Smoker  . Smokeless tobacco: Never Used  . Alcohol use No     Allergies   Review of patient's allergies indicates no known allergies.   Review of Systems Review of Systems  ROS reviewed and all otherwise negative except for mentioned in HPI   Physical Exam Updated Vital Signs BP (!) 145/55 (BP Location: Right Arm)   Pulse 69   Temp 98.6 F (37 C) (Oral)   Resp 17   Ht 5\' 2"  (1.575 m)   Wt 150 lb 12.8 oz (68.4 kg)   SpO2 100%   BMI 27.58 kg/m  Vitals reviewed Physical Exam Physical Examination: General appearance - alert, well appearing, and in no distress Mental status - alert, oriented to person, place, and time Eyes - pupils equal and reactive, extraocular eye movements intact Mouth - mucous membranes moist, pharynx normal without lesions Chest - clear to auscultation, no wheezes, rales or rhonchi, symmetric air entry Heart - normal rate, regular rhythm, normal S1, S2, no murmurs, rubs, clicks or gallops Abdomen - soft, nontender, nondistended, no masses or organomegaly Neurological - alert, oriented, normal speech, Extremities - peripheral pulses normal, no pedal edema, no clubbing or cyanosis Skin - normal coloration and turgor, no rashes  ED Treatments / Results  Labs (all labs ordered are listed, but only abnormal results are displayed) Labs Reviewed  BASIC METABOLIC PANEL - Abnormal; Notable for the following:       Result Value   Glucose, Bld 100 (*)    BUN 22 (*)    Creatinine, Ser 1.27 (*)    GFR calc non Af Amer 37 (*)    GFR calc Af Amer 43 (*)    All other components within normal limits  CBC - Abnormal; Notable for the following:    WBC 3.9 (*)    RBC 3.33 (*)    Hemoglobin 9.9 (*)    HCT 30.4 (*)    All other components within normal limits  COMPREHENSIVE METABOLIC PANEL - Abnormal; Notable for the following:    Glucose, Bld 126 (*)    Creatinine, Ser 1.13 (*)    Albumin 3.4 (*)    ALT 12 (*)    GFR calc  non Af Amer 42 (*)    GFR calc Af Amer 49 (*)    All other components within normal limits  TROPONIN I - Abnormal; Notable for the following:    Troponin I 0.05 (*)    All other components within normal limits  CBC - Abnormal; Notable for the following:    WBC 3.0 (*)    RBC 3.12 (*)    Hemoglobin 9.1 (*)    HCT 29.5 (*)    Platelets 138 (*)  All other components within normal limits  CBC - Abnormal; Notable for the following:    WBC 2.4 (*)    RBC 2.96 (*)    Hemoglobin 8.9 (*)    HCT 27.8 (*)    Platelets 142 (*)    All other components within normal limits  BASIC METABOLIC PANEL - Abnormal; Notable for the following:    Chloride 100 (*)    Creatinine, Ser 1.04 (*)    GFR calc non Af Amer 47 (*)    GFR calc Af Amer 54 (*)    All other components within normal limits  VITAMIN B12 - Abnormal; Notable for the following:    Vitamin B-12 1,799 (*)    All other components within normal limits  RETICULOCYTES - Abnormal; Notable for the following:    RBC. 3.12 (*)    All other components within normal limits  BASIC METABOLIC PANEL - Abnormal; Notable for the following:    Sodium 134 (*)    Chloride 100 (*)    GFR calc non Af Amer 50 (*)    GFR calc Af Amer 58 (*)    All other components within normal limits  MAGNESIUM  TSH  TSH  TROPONIN I  TROPONIN I  FOLATE  IRON AND TIBC  FERRITIN  GLUCOSE, CAPILLARY  I-STAT TROPOININ, ED    EKG  EKG Interpretation  Date/Time:  Tuesday November 21 2015 17:23:57 EDT Ventricular Rate:  37 PR Interval:    QRS Duration: 91 QT Interval:  494 QTC Calculation: 388 R Axis:   13 Text Interpretation:  Second degree AV block, Mobitz II Atrial premature complex Probable anterior infarct, old Minimal ST depression, lateral leads No significant change since last tracing Confirmed by Karma Ganja  MD, Jaydeen Odor [phone]) on 11/22/2015 12:22:50 AM Also confirmed by Karma Ganja  MD, Makyah Lavigne [phone]), editor [place], Cala Bradford [phone])  on 11/22/2015 11:33:01 AM         Radiology Dg Chest 2 View  Result Date: 11/24/2015 CLINICAL DATA:  Cardiac device in situ.  Pacemaker placement. EXAM: CHEST  2 VIEW COMPARISON:  One-view chest x-ray 11/21/2015. FINDINGS: The the heart is enlarged. Atherosclerotic calcifications are present at the aortic arch. A dual lead pacemaker is in place. Leads terminate in the right atrium and right ventricle. There is no pneumothorax. Emphysema is noted. Interstitial and airspace opacities are superimposed at the bases bilaterally, likely reflecting atelectasis. The upper lungs fields remain clear. The visualized soft tissues and bony thorax are unremarkable. IMPRESSION: 1. Interval placement of dual lead pacemaker via a left subclavian approach without radiographic evidence for complication. 2. Emphysema. 3. Increased lower lobe markings bilaterally likely reflect atelectasis. Electronically Signed   By: Marin Roberts M.D.   On: 11/24/2015 07:36    Procedures Procedures (including critical care time)  Medications Ordered in ED Medications  gentamicin (GARAMYCIN) 80 mg in sodium chloride irrigation 0.9 % 500 mL irrigation (80 mg Irrigation Given 11/23/15 1336)  chlorhexidine (HIBICLENS) 4 % liquid 4 application (4 application Topical Given 11/22/15 2135)  chlorhexidine (HIBICLENS) 4 % liquid 4 application (4 application Topical Given 11/23/15 1215)  ceFAZolin (ANCEF) IVPB 2g/100 mL premix (2 g Intravenous Given 11/23/15 1247)  ceFAZolin (ANCEF) IVPB 1 g/50 mL premix (1 g Intravenous Given 11/24/15 0443)     Initial Impression / Assessment and Plan / ED Course  I have reviewed the triage vital signs and the nursing notes.  Pertinent labs & imaging results that were available during my care of the patient  were reviewed by me and considered in my medical decision making (see chart for details).  Clinical Course   6:06 PM d/w Dr. Antoine PocheHochrein- cardiology- pt [facility].    Pt presenting with c/o  diaphoresis- found to be bradycardic in second degree heart block- type I, but rhythm variable with rates in 30s.  [facility]- will likely need pacemaker.    Final Clinical Impressions(s) / ED Diagnoses   Final diagnoses:  Abnormal heart rate  Cardiac device in situ    New Prescriptions Discharge Medication List as of 11/24/2015  9:44 AM       Jerelyn ScottMartha Linker, MD 11/25/15 1222

## 2015-11-21 NOTE — ED Triage Notes (Signed)
PT ARRIVED FROM HOME STS THAT SHE WAS EATING LUNCH AND BEGAN TO SWEAT. SHE STS SHE WENT TO LAY DOWN, BUT SHE JUST "DIDN'T FEEL RIGHT". DENIES CP/SOB.

## 2015-11-22 DIAGNOSIS — R55 Syncope and collapse: Secondary | ICD-10-CM

## 2015-11-22 DIAGNOSIS — I442 Atrioventricular block, complete: Principal | ICD-10-CM

## 2015-11-22 DIAGNOSIS — R001 Bradycardia, unspecified: Secondary | ICD-10-CM

## 2015-11-22 LAB — CBC
HCT: 27.8 % — ABNORMAL LOW (ref 36.0–46.0)
Hemoglobin: 8.9 g/dL — ABNORMAL LOW (ref 12.0–15.0)
MCH: 30.1 pg (ref 26.0–34.0)
MCHC: 32 g/dL (ref 30.0–36.0)
MCV: 93.9 fL (ref 78.0–100.0)
PLATELETS: 142 10*3/uL — AB (ref 150–400)
RBC: 2.96 MIL/uL — AB (ref 3.87–5.11)
RDW: 13.2 % (ref 11.5–15.5)
WBC: 2.4 10*3/uL — ABNORMAL LOW (ref 4.0–10.5)

## 2015-11-22 LAB — BASIC METABOLIC PANEL
Anion gap: 9 (ref 5–15)
BUN: 17 mg/dL (ref 6–20)
CALCIUM: 9.4 mg/dL (ref 8.9–10.3)
CO2: 27 mmol/L (ref 22–32)
CREATININE: 1.04 mg/dL — AB (ref 0.44–1.00)
Chloride: 100 mmol/L — ABNORMAL LOW (ref 101–111)
GFR calc Af Amer: 54 mL/min — ABNORMAL LOW (ref >60)
GFR, EST NON AFRICAN AMERICAN: 47 mL/min — AB (ref >60)
Glucose, Bld: 92 mg/dL (ref 65–99)
Potassium: 3.7 mmol/L (ref 3.5–5.1)
SODIUM: 136 mmol/L (ref 135–145)

## 2015-11-22 LAB — TROPONIN I: Troponin I: 0.05 ng/mL (ref <0.03)

## 2015-11-22 MED ORDER — SODIUM CHLORIDE 0.9% FLUSH
3.0000 mL | Freq: Two times a day (BID) | INTRAVENOUS | Status: DC
  Administered 2015-11-22: 3 mL via INTRAVENOUS

## 2015-11-22 MED ORDER — SODIUM CHLORIDE 0.9 % IV SOLN
INTRAVENOUS | Status: DC
  Administered 2015-11-23: 06:00:00 via INTRAVENOUS

## 2015-11-22 MED ORDER — CEFAZOLIN SODIUM-DEXTROSE 2-4 GM/100ML-% IV SOLN
2.0000 g | INTRAVENOUS | Status: AC
  Administered 2015-11-23: 2 g via INTRAVENOUS

## 2015-11-22 MED ORDER — CHLORHEXIDINE GLUCONATE 4 % EX LIQD
60.0000 mL | Freq: Once | CUTANEOUS | Status: AC
  Administered 2015-11-23: 4 via TOPICAL
  Filled 2015-11-22: qty 15

## 2015-11-22 MED ORDER — CHLORHEXIDINE GLUCONATE 4 % EX LIQD
60.0000 mL | Freq: Once | CUTANEOUS | Status: AC
  Administered 2015-11-22: 4 via TOPICAL
  Filled 2015-11-22: qty 60

## 2015-11-22 MED ORDER — SODIUM CHLORIDE 0.9 % IR SOLN
80.0000 mg | Status: AC
  Administered 2015-11-23: 80 mg

## 2015-11-22 MED ORDER — SODIUM CHLORIDE 0.9% FLUSH: 3.0000 mL | INTRAVENOUS | Status: DC | PRN

## 2015-11-22 MED ORDER — CEFAZOLIN SODIUM-DEXTROSE 2-4 GM/100ML-% IV SOLN: 2.0000 g | INTRAVENOUS | Status: DC

## 2015-11-22 MED ORDER — SODIUM CHLORIDE 0.9 % IV SOLN: 250.0000 mL | INTRAVENOUS | Status: DC

## 2015-11-22 MED ORDER — SODIUM CHLORIDE 0.9 % IV SOLN: INTRAVENOUS | Status: DC

## 2015-11-22 NOTE — Consult Note (Signed)
ELECTROPHYSIOLOGY CONSULT NOTE    Patient ID: Robin SilversBernell G Rodriques MRN: [id number], DOB/AGE: Aug 21, 1926 82 y.o.  Admit date: 11/21/2015 Date of Consult: 11/22/2015   Primary Physician: Garlan FillersPATERSON,DANIEL G, MD Primary Cardiologist: Dr. Ladona Ridgelaylor Requesting MD: Dr. Donnie Ahoilley  Reason for Consultation: bradycardia  HPI: Robin SilversBernell G Waldron is a 82 y.o. female with PMHx of HTN, HLD, arthritis, and previously unexplained syncope having had a ILR, though had not had recurrent syncope and did not disclose any abnormalities to explain her syncope and explanted once battery was depleted.   [facility] with c/o dizziness,   She last saw Dr. Ladona Ridgelaylor in June of this year after being lost to f/u for 3.5years, at that time doing well though with HTN uncontrolled and procardia was added to her tx.  She was noted at that visit to have Mobitz I with recommendation to avoid nodal blocking agents.  His not does refer to hx of bradycardia that resolved of BB remotely as well.  EP was made aware of this consult not until this afternoon, we are being asked to evaluate for possible PPM implant.  Patient tells me she was feeling fine, suddenly felt strange, broke into a clammy sweat, thought she needed to use the BR, did have a BM (normal) and went back to sit and relax, felt worse and went to the hospital.  She had no CP of any kind, no SOB, no near syncope or syncope, just quite weak and "knew something was off".  She is OOB To chair now, only c/o being hungry.  She does her usual ADLs without difficulty.  There is remote hx of recurrent syncope that had resolved without diagnosis, possibly a fall vs syncope last year, there is disagreement amongst the patient/family what this event was, the patient states she missed the cammode when sitting and fell.  LABS: K+ 3.8 BUN/Creat 22/1.27 >> 1.04 Mag 1.8 H/H 9.9/30 >> 8.9/27.8  (on 08/17/14 H/H 10.6/33.3) (on 08/08/14 H/H 8.8/27.6) WBC  3.9 plts 167 poc Trop 0.01 Trop I: <0.03 x2, 0.05 TSH 0.863  Past Medical History:  Diagnosis Date  . Arthritis   . Bradycardia   . Dyslipidemia   . High cholesterol   . Hypertension   . S/P hysterectomy   . Syncope      Surgical History:  Past Surgical History:  Procedure Laterality Date  . ABDOMINAL HYSTERECTOMY    . implantation of implantable loop recorder    . LOOP RECORDER EXPLANT N/A 03/13/2012   Procedure: LOOP RECORDER EXPLANT;  Surgeon: Marinus MawGregg W Taylor, MD;  [facility];  Service: Cardiovascular;  Laterality: N/A;  . REPLACEMENT TOTAL KNEE Left      Prescriptions Prior to Admission  Medication Sig Dispense Refill Last Dose  . acetaminophen (TYLENOL) 650 MG CR tablet Take 650 mg by mouth 2 (two) times daily.   11/20/2015 at Unknown time  . aspirin EC 81 MG tablet Take 81 mg by mouth daily with breakfast.    11/21/2015 at Unknown time  . calcium-vitamin D (OSCAL WITH D) 500-200 MG-UNIT per tablet Take 1 tablet by mouth 2 (two) times daily.   11/21/2015 at am  . docusate sodium (COLACE) 100 MG capsule Take 100 mg by mouth daily as needed for mild constipation.   Past Week at Unknown time  . ergocalciferol (VITAMIN D2) 50000 UNITS capsule Take 50,000 Units by mouth every 14 (fourteen) days.   11/11/2015  . Influenza Vac Split  Quad (FLUZONE) 0.25 ML injection Inject 0.25 mLs into the muscle once.   Past Month at Unknown time  . losartan-hydrochlorothiazide (HYZAAR) 50-12.5 MG tablet Take 1 tablet by mouth daily with breakfast.   0 11/21/2015 at Unknown time  . metoCLOPramide (REGLAN) 5 MG tablet Take 1 tablet by mouth daily with supper.  6 11/20/2015 at Unknown time  . NIFEdipine (PROCARDIA-XL/ADALAT-CC/NIFEDICAL-XL) 30 MG 24 hr tablet Take 1 tablet (30 mg total) by mouth daily. (Patient taking differently: Take 30 mg by mouth at bedtime. ) 90 tablet 3 11/20/2015 at Unknown time  . oxybutynin (DITROPAN) 5 MG tablet Take 5 mg by mouth at bedtime.    11/20/2015 at Unknown  time  . pantoprazole (PROTONIX) 40 MG tablet Take 40 mg by mouth daily with breakfast.   11/21/2015 at Unknown time  . Polyethyl Glycol-Propyl Glycol (SYSTANE) 0.4-0.3 % SOLN Place 1 drop into both eyes 2 (two) times daily.   11/21/2015 at am  . simvastatin (ZOCOR) 20 MG tablet Take 20 mg by mouth at bedtime.    11/20/2015 at Unknown time  . vitamin B-12 (CYANOCOBALAMIN) 500 MCG tablet Take 500 mcg by mouth daily with breakfast.   11/21/2015 at Unknown time    Inpatient Medications:  . acetaminophen  650 mg Oral BID  . aspirin EC  81 mg Oral Daily  . calcium-vitamin D  1 tablet Oral BID  . cyanocobalamin  500 mcg Oral Q breakfast  . enoxaparin (LOVENOX) injection  30 mg Subcutaneous Q24H  . losartan  50 mg Oral Q breakfast   And  . hydrochlorothiazide  12.5 mg Oral Q breakfast  . metoCLOPramide  5 mg Oral Q supper  . NIFEdipine  30 mg Oral QHS  . oxybutynin  5 mg Oral QHS  . pantoprazole  40 mg Oral Q breakfast  . polyvinyl alcohol  1 drop Both Eyes BID  . simvastatin  20 mg Oral QHS    Allergies: No Known Allergies  Social History   Social History  . Marital status: Unknown    Spouse name: N/A  . Number of children: 2  . Years of education: N/A   Occupational History  . 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  Retired   Social History Main Topics  . Smoking status: Never Smoker  . Smokeless tobacco: Never Used  . Alcohol use No  . Drug use: No  . Sexual activity: Not on file   Other Topics Concern  . Not on file   Social History Narrative            Family History  Problem Relation Age of Onset  . Diabetes Other      Review of Systems: All other systems reviewed and are otherwise negative except as noted above.  Physical Exam: Vitals:   11/22/15 0824 11/22/15 0925 11/22/15 1025 11/22/15 1145  BP: (!) 126/47 (!) 140/41 (!) 148/49 (!) 144/53  Pulse:    (!) 43  Resp:    17  Temp:    98.2 F (36.8 C)  TempSrc:    Oral  SpO2:    99%  Weight:       Height:        GEN- The patient is well appearing, alert and oriented x 3 today.   HEENT: normocephalic, atraumatic; sclera clear, conjunctiva pink; hearing intact; oropharynx clear; neck supple, no JVP Lymph- no cervical lymphadenopathy Lungs- Clear to ausculation bilaterally, normal work of breathing.  No wheezes, rales,  rhonchi Heart- slow and irregular rate and rhythm, no murmurs, rubs or gallops, PMI not laterally displaced GI- soft, non-tender, non-distended Extremities- no clubbing, cyanosis, or edema MS- no significant deformity or atrophy Skin- warm and dry, no rash or lesion Psych- euthymic mood, full affect Neuro- no gross deficits observed  Labs:   Lab Results  Component Value Date   WBC 2.4 (L) 11/22/2015   HGB 8.9 (L) 11/22/2015   HCT 27.8 (L) 11/22/2015   MCV 93.9 11/22/2015   PLT 142 (L) 11/22/2015    Recent Labs Lab 11/21/15 2044 11/22/15 0805  NA 135 136  K 4.3 3.7  CL 102 100*  CO2 27 27  BUN 19 17  CREATININE 1.13* 1.04*  CALCIUM 9.4 9.4  PROT 7.3  --   BILITOT 0.4  --   ALKPHOS 75  --   ALT 12*  --   AST 23  --   GLUCOSE 126* 92      Radiology/Studies:  Dg Chest Port 1 View Result Date: 11/21/2015 CLINICAL DATA:  Irregular heart beat.  Bradycardia and hypertension. EXAM: PORTABLE CHEST 1 VIEW COMPARISON:  08/07/2014 FINDINGS: Mild cardiac enlargement. There is diffuse reticular opacities which appear basilar predominant and similar to previous exam. No superimposed airspace consolidation. No pleural effusion or edema noted. IMPRESSION: 1. Bilateral, lower lobe predominant reticular interstitial opacities suspicious for interstitial lung disease. Consider further evaluation with nonemergent high-resolution CT of the chest. Electronically Signed   By: Signa Kell M.D.   On: 11/21/2015 18:15    EKG: 2:1 heart block, V rate 37 TELEMETRY: SB 50's, Mobitz I regularly often 3:1, generally in to 30's, occasionally 2:1 with rates into 30's  Echo  is pending  02/1907: TTE SUMMARY - Overall left ventricular systolic function was normal. Left    ventricular ejection fraction was estimated , range being 55    % to 60 %. There were no left ventricular regional wall    motion abnormalities. Doppler parameters were consistent with    abnormal left ventricular relaxation; this left ventricular    filling pattern is also seen with aging  Assessment and Plan:   1. Symptomatic bradycardia, variable heart block     No reversible causes found     No rate limiting/nodal blocking agents at home or here     Given hx of syncope, and bradycardic rates with Mobitz I, recommend PPM.     Discussed risk/benefits of the procedure, the patient would like to proceed     She is asymptomatic at rest at this time, her BP stable, we will put her on the schedule for tomorrow     2. HTN     stable  3> Longstanding neutropenia and anemia  Signed, Francis Dowse, PA-C 11/22/2015 1:14 PM  Pt seen and interviewed and examined, PE adjusted as noted   Longstanding bradycardia presenting w a nonspecific series of symptoms.  However on further questioning, has had a number of episodes of abrupt onset syncope in the last few years.  Some have occurred in the commode, others unrelated to such activities.    ECG from yday show higher grade ( than MBz1) with some intermittent conductivity but many non donducted P waves  Echo pending   1. Symptomatic bradycardia, variable heart block      2. Syncope  3. Htn  4.Neutropenia and anemia- long standing     The pt has perhaps symptomatic bradycardia but also recurrent syncope without another good explanation and with high  grade conduction system disease, it is appropriate to pace for relief of symptoms  The benefits and risks were reviewed including but not limited to death,  perforation, infection, lead dislodgement and device malfunction.  The patient understands agrees and is willing to  proceed.

## 2015-11-22 NOTE — Progress Notes (Addendum)
Report received in patient's room via EMT upon admission at 19:30 reviewed  VS and patient's general condition, assumed care of patient.

## 2015-11-22 NOTE — Plan of Care (Signed)
Problem: Activity: Goal: Ability to tolerate increased activity will improve Outcome: Progressing Tolerates ambulation with BRP. Denies SOB or Weakness at this time. BP stable.

## 2015-11-22 NOTE — Plan of Care (Signed)
Problem: Safety: Goal: Ability to remain free from injury will improve Outcome: Completed/Met Date Met: 11/22/15 Patient was instructed on admission not to get OOB without caling and she uses her call light appropriately and waits for assistance as instructed.

## 2015-11-23 ENCOUNTER — Inpatient Hospital Stay (HOSPITAL_COMMUNITY): Payer: Commercial Managed Care - HMO

## 2015-11-23 ENCOUNTER — Encounter (HOSPITAL_COMMUNITY)
Admission: EM | Disposition: A | Discharge status: Home / Self Care | Payer: Self-pay | Source: Home / Self Care | Attending: Cardiology

## 2015-11-23 ENCOUNTER — Encounter (HOSPITAL_COMMUNITY): Payer: Self-pay | Admitting: Internal Medicine

## 2015-11-23 DIAGNOSIS — I119 Hypertensive heart disease without heart failure: Secondary | ICD-10-CM

## 2015-11-23 DIAGNOSIS — I441 Atrioventricular block, second degree: Secondary | ICD-10-CM

## 2015-11-23 DIAGNOSIS — R9431 Abnormal electrocardiogram [ECG] [EKG]: Secondary | ICD-10-CM

## 2015-11-23 HISTORY — PX: EP IMPLANTABLE DEVICE: SHX172B

## 2015-11-23 LAB — ECHOCARDIOGRAM COMPLETE
Height: 62 in
Weight: 2428.8 oz

## 2015-11-23 SURGERY — PACEMAKER IMPLANT
Anesthesia: LOCAL

## 2015-11-23 MED ORDER — ACETAMINOPHEN 325 MG PO TABS
325.0000 mg | ORAL_TABLET | ORAL | Status: DC | PRN
  Administered 2015-11-23: 650 mg via ORAL
  Filled 2015-11-23: qty 2

## 2015-11-23 MED ORDER — HEPARIN (PORCINE) IN NACL 2-0.9 UNIT/ML-% IJ SOLN
INTRAMUSCULAR | Status: AC
  Filled 2015-11-23: qty 500

## 2015-11-23 MED ORDER — SODIUM CHLORIDE 0.9 % IR SOLN
Status: AC
  Filled 2015-11-23: qty 2

## 2015-11-23 MED ORDER — FENTANYL CITRATE (PF) 100 MCG/2ML IJ SOLN
INTRAMUSCULAR | Status: AC
  Filled 2015-11-23: qty 2

## 2015-11-23 MED ORDER — LIDOCAINE HCL (PF) 1 % IJ SOLN
INTRAMUSCULAR | Status: DC | PRN
  Administered 2015-11-23: 31 mL via INTRADERMAL

## 2015-11-23 MED ORDER — SODIUM CHLORIDE 0.9% FLUSH
3.0000 mL | Freq: Two times a day (BID) | INTRAVENOUS | Status: DC
  Administered 2015-11-23: 3 mL via INTRAVENOUS

## 2015-11-23 MED ORDER — SODIUM CHLORIDE 0.9 % IV SOLN: 250.0000 mL | INTRAVENOUS | Status: DC | PRN

## 2015-11-23 MED ORDER — SODIUM CHLORIDE 0.9% FLUSH
3.0000 mL | INTRAVENOUS | Status: DC | PRN
Start: 2015-11-23 — End: 2015-11-24

## 2015-11-23 MED ORDER — MIDAZOLAM HCL 5 MG/5ML IJ SOLN
INTRAMUSCULAR | Status: DC | PRN
  Administered 2015-11-23: 1 mg via INTRAVENOUS

## 2015-11-23 MED ORDER — IOPAMIDOL (ISOVUE-370) INJECTION 76%
INTRAVENOUS | Status: DC | PRN
  Administered 2015-11-23: 15 mL via INTRAVENOUS

## 2015-11-23 MED ORDER — MIDAZOLAM HCL 5 MG/5ML IJ SOLN
INTRAMUSCULAR | Status: AC
  Filled 2015-11-23: qty 5

## 2015-11-23 MED ORDER — HEPARIN (PORCINE) IN NACL 2-0.9 UNIT/ML-% IJ SOLN
INTRAMUSCULAR | Status: DC | PRN
  Administered 2015-11-23: 13:00:00

## 2015-11-23 MED ORDER — ONDANSETRON HCL 4 MG/2ML IJ SOLN: 4.0000 mg | Freq: Four times a day (QID) | INTRAMUSCULAR | Status: DC | PRN

## 2015-11-23 MED ORDER — LIDOCAINE HCL (PF) 1 % IJ SOLN
INTRAMUSCULAR | Status: AC
  Filled 2015-11-23: qty 60

## 2015-11-23 MED ORDER — FENTANYL CITRATE (PF) 100 MCG/2ML IJ SOLN
INTRAMUSCULAR | Status: DC | PRN
  Administered 2015-11-23: 12.5 ug via INTRAVENOUS

## 2015-11-23 MED ORDER — CEFAZOLIN SODIUM-DEXTROSE 2-4 GM/100ML-% IV SOLN
INTRAVENOUS | Status: AC
  Filled 2015-11-23: qty 100

## 2015-11-23 MED ORDER — CEFAZOLIN IN D5W 1 GM/50ML IV SOLN
1.0000 g | Freq: Four times a day (QID) | INTRAVENOUS | Status: AC
  Administered 2015-11-23 – 2015-11-24 (×3): 1 g via INTRAVENOUS
  Filled 2015-11-23 (×3): qty 50

## 2015-11-23 MED ORDER — IOPAMIDOL (ISOVUE-370) INJECTION 76%
INTRAVENOUS | Status: AC
  Filled 2015-11-23: qty 50

## 2015-11-23 MED ORDER — HYDROCODONE-ACETAMINOPHEN 5-325 MG PO TABS: 1.0000 | ORAL_TABLET | ORAL | Status: DC | PRN

## 2015-11-23 SURGICAL SUPPLY — 7 items
CABLE SURGICAL S-101-97-12 (CABLE) ×1 IMPLANT
LEAD CAPSURE NOVUS 5076-58CM (Lead) ×1 IMPLANT
LEAD TENDRIL SDX 2088TC-46CM (Lead) ×1 IMPLANT
PACEMAKER ASSURITY DR-RF (Pacemaker) ×1 IMPLANT
PAD DEFIB LIFELINK (PAD) ×1 IMPLANT
SHEATH CLASSIC 7F (SHEATH) ×2 IMPLANT
TRAY PACEMAKER INSERTION (PACKS) ×1 IMPLANT

## 2015-11-23 NOTE — Interval H&P Note (Signed)
History and Physical Interval Note:  11/23/2015 12:19 PM  [name]  has presented today for surgery, with the diagnosis of symptomatic bradycardia  The various methods of treatment have been discussed with the patient and family. After consideration of risks, benefits and other options for treatment, the patient has consented to  Procedure(s): Pacemaker Implant (N/A) as a surgical intervention .  The patient's history has been reviewed, patient examined, no change in status, stable for surgery.  I have reviewed the patient's chart and labs.  Questions were answered to the patient's satisfaction.     Hillis RangeJames Annalea Alguire

## 2015-11-23 NOTE — Discharge Summary (Signed)
ELECTROPHYSIOLOGY PROCEDURE DISCHARGE SUMMARY    Patient ID: [name],  MRN: [id number], DOB/AGE: 1926-04-05 82 y.o.  Admit date: 11/21/2015 Discharge date: 11/24/15  Primary Care Physician: Garlan FillersPATERSON,DANIEL G, MD Primary Cardiologist/Electrophysiologist: Ladona Ridgelaylor  Primary Discharge Diagnosis:  1. Symptomatic bradycardia     S/p PPM implant this admission  Secondary Discharge Diagnosis:  1. HTN 2. Chronic anemia, neutropenia  No Known Allergies   Procedures This Admission:  1.  Implantation of a SJM dual chamber PPM on 11/23/15 by Dr Johney FrameAllred.  The patient received a [employer]urity MRI DR  model [id number] (serial number  [id number]) pacemaker, and [facility] [employer] model [id number] (serial number  [id number]) right atrial lead and a Medtronic model [id number] (serial number  [id number]) right ventricular lead. There were no immediate post procedure complications. 2.  CXR on 11/24/15 demonstrated no pneumothorax status post device implantation.   Brief HPI: [name] is a 82 y.o. female PMHx of HTN, HLD, arthritis, and previously unexplained syncope having had a ILR, though had not had recurrent syncope and did not disclose any abnormalities to explain her syncope and explanted once battery was depleted.  She was noted outpatient to hvave Mobitz 1, no higher degree heart block discussed.  [facility] night with c/o dizziness, noting her HR slow with 2:1 conduction, rates in 30's at times  Hospital Course:  The patient was admitted and monitored on telemetry, she reported at home she was feeling fine, suddenly felt strange, broke into a clammy sweat, thought she needed to use the BR, did have a BM (normal) and went back to sit and relax, felt worse and went to the hospital.  She had no CP of any kind, no SOB, no near syncope or syncope, just quite weak and "knew something was off".  She was noted to have 2:1 conduction with variable heart  block, generally in Mobitz1 with bradycardia 40's-50's, and with 2:1 conduction in the 30's.  She was evaluated by EP service, and recommended to undergo PPM implantation.  An echocardiogram was done noting LVEF 60-65%, no significant VHD.  No reversible causes forher heart block were identified and she and underwent implantation of a PPM 11/23/15 with details as outlined above. Shewas monitored on telemetry overnight which demonstrated SR and AV pacing.  Left chest was without hematoma or ecchymosis.  The device was interrogated and found to be functioning normally.  CXR was obtained and demonstrated no pneumothorax status post device implantation, no cough or SOB, no symptoms of illness, the patient is encouraged to be OOB/ambulating at home.  Wound care, arm mobility, and restrictions were reviewed with the patient.  The patient was examined by Dr. Johney FrameAllred and considered stable for discharge to home.    Physical Exam: Vitals:   11/23/15 2000 11/24/15 0349 11/24/15 0356 11/24/15 0700  BP: (!) 134/36 (!) 139/57  (!) 145/55  Pulse: 60   69  Resp: (!) 21 16  17   Temp: 98.8 F (37.1 C)  98.8 F (37.1 C) 98.6 F (37 C)  TempSrc:   Oral Oral  SpO2: 100% 100%  100%  Weight:  150 lb 12.8 oz (68.4 kg)    Height:         GEN- The patient is well appearing, alert and oriented x 3 today.   HEENT: normocephalic, atraumatic; sclera clear, conjunctiva pink; hearing intact; oropharynx clear; neck supple, no JVP Lungs- Clear to ausculation bilaterally, normal work  of breathing.  No wheezes, rales, rhonchi Heart- Regular rate and rhythm, no murmurs, rubs or gallops, PMI not laterally displaced GI- soft, non-tender, non-distended Extremities- no clubbing, cyanosis, or edema MS- no significant deformity or atrophy Skin- warm and dry, no rash or lesion, left chest without hematoma/ecchymosis Psych- euthymic mood, full affect Neuro- no gross deficits   Labs:   Lab Results  Component Value Date   WBC  2.4 (L) 11/22/2015   HGB 8.9 (L) 11/22/2015   HCT 27.8 (L) 11/22/2015   MCV 93.9 11/22/2015   PLT 142 (L) 11/22/2015    Recent Labs Lab 11/21/15 2044  11/24/15 0435  NA 135  < > 134*  K 4.3  < > 3.7  CL 102  < > 100*  CO2 27  < > 26  BUN 19  < > 10  CREATININE 1.13*  < > 0.99  CALCIUM 9.4  < > 9.0  PROT 7.3  --   --   BILITOT 0.4  --   --   ALKPHOS 75  --   --   ALT 12*  --   --   AST 23  --   --   GLUCOSE 126*  < > 87  < > = values in this interval not displayed.  Discharge Medications:    Medication List    STOP taking these medications   Influenza Vac Split Quad 0.25 ML injection Commonly known as:  FLUZONE     TAKE these medications   acetaminophen 650 MG CR tablet Commonly known as:  TYLENOL Take 650 mg by mouth 2 (two) times daily.   aspirin EC 81 MG tablet Take 81 mg by mouth daily with breakfast.   calcium-vitamin D 500-200 MG-UNIT tablet Commonly known as:  OSCAL WITH D Take 1 tablet by mouth 2 (two) times daily.   docusate sodium 100 MG capsule Commonly known as:  COLACE Take 100 mg by mouth daily as needed for mild constipation.   ergocalciferol 50000 units capsule Commonly known as:  VITAMIN D2 Take 50,000 Units by mouth every 14 (fourteen) days.   losartan-hydrochlorothiazide 50-12.5 MG tablet Commonly known as:  HYZAAR Take 1 tablet by mouth daily with breakfast.   metoCLOPramide 5 MG tablet Commonly known as:  REGLAN Take 1 tablet by mouth daily with supper.   NIFEdipine 30 MG 24 hr tablet Commonly known as:  PROCARDIA-XL/ADALAT-CC/NIFEDICAL-XL Take 1 tablet (30 mg total) by mouth daily. What changed:  when to take this   oxybutynin 5 MG tablet Commonly known as:  DITROPAN Take 5 mg by mouth at bedtime.   pantoprazole 40 MG tablet Commonly known as:  PROTONIX Take 40 mg by mouth daily with breakfast.   simvastatin 20 MG tablet Commonly known as:  ZOCOR Take 20 mg by mouth at bedtime.   SYSTANE 0.4-0.3 % Soln Generic drug:   Polyethyl Glycol-Propyl Glycol Place 1 drop into both eyes 2 (two) times daily.   vitamin B-12 500 MCG tablet Commonly known as:  CYANOCOBALAMIN Take 500 mcg by mouth daily with breakfast.       Disposition:  Home  Discharge Instructions    Diet - low sodium heart healthy    Complete by:  As directed    Increase activity slowly    Complete by:  As directed      Follow-up Information    [facility] [address] Follow up on 12/06/2015.   Specialty:  Cardiology Why:  11:00AM Contact information: [address],  [address] [place] [address] [phone]          Duration of Discharge Encounter: Greater than 30 minutes including physician time.  Signed, Francis Dowse, PA-C 11/24/2015 9:07 AM   I have seen, examined the patient, and reviewed the above assessment and plan.  Changes to above are made where necessary.   Device interrogation reviewed and normal.  CXR reveals stable leads, no ptx.  Co Sign: Hillis Range, MD 11/24/2015 1:54 PM

## 2015-11-23 NOTE — Progress Notes (Signed)
SUBJECTIVE: The patient is doing well today.  At this time, she denies chest pain, shortness of breath, or any new concerns.  Marland Kitchen acetaminophen  650 mg Oral BID  . aspirin EC  81 mg Oral Daily  . calcium-vitamin D  1 tablet Oral BID  .  ceFAZolin (ANCEF) IV  2 g Intravenous On Call  .  ceFAZolin (ANCEF) IV  2 g Intravenous On Call  . chlorhexidine  60 mL Topical Once  . cyanocobalamin  500 mcg Oral Q breakfast  . gentamicin irrigation  80 mg Irrigation On Call  . losartan  50 mg Oral Q breakfast   And  . hydrochlorothiazide  12.5 mg Oral Q breakfast  . metoCLOPramide  5 mg Oral Q supper  . NIFEdipine  30 mg Oral QHS  . oxybutynin  5 mg Oral QHS  . pantoprazole  40 mg Oral Q breakfast  . polyvinyl alcohol  1 drop Both Eyes BID  . simvastatin  20 mg Oral QHS  . sodium chloride flush  3 mL Intravenous Q12H   . sodium chloride    . sodium chloride 50 mL/hr at 11/23/15 0540    OBJECTIVE: Physical Exam: Vitals:   11/22/15 1640 11/22/15 2018 11/23/15 0019 11/23/15 0453  BP: (!) 140/52 (!) 144/32 (!) 137/41 (!) 128/51  Pulse: (!) 58 (!) 54 (!) 56 (!) 39  Resp: 16 16 15 15   Temp: 98.3 F (36.8 C) 98.2 F (36.8 C) 97.8 F (36.6 C) 97.8 F (36.6 C)  TempSrc: Oral Oral Oral Oral  SpO2: 100% 96% 100% 100%  Weight:    151 lb 12.8 oz (68.9 kg)  Height:        Intake/Output Summary (Last 24 hours) at 11/23/15 1191 Last data filed at 11/22/15 2200  Gross per 24 hour  Intake              360 ml  Output              900 ml  Net             -540 ml    Telemetry reveals SR, second degree AV block continues, generally 40's, down to 30's with occasional 2:1 condiction  GEN- The patient is elderly appearing, alert and oriented x 3 today.   Head- normocephalic, atraumatic Eyes-  Sclera clear, conjunctiva pink Ears- hearing intact Oropharynx- clear Neck- supple, no JVP Lungs- Clear to ausculation bilaterally, normal work of breathing Heart- bradycardic regular rhythm GI- soft,  NT, ND Extremities- no clubbing, cyanosis, or edema Skin- no rash or lesion Psych- euthymic mood, full affect Neuro- no gross deficits appreciated  LABS: Basic Metabolic Panel:  Recent Labs  47/82/95 1722 11/21/15 2044 11/22/15 0805  NA 137 135 136  K 3.8 4.3 3.7  CL 102 102 100*  CO2 28 27 27   GLUCOSE 100* 126* 92  BUN 22* 19 17  CREATININE 1.27* 1.13* 1.04*  CALCIUM 9.5 9.4 9.4  MG 1.8  --   --    Liver Function Tests:  Recent Labs  11/21/15 2044  AST 23  ALT 12*  ALKPHOS 75  BILITOT 0.4  PROT 7.3  ALBUMIN 3.4*   CBC:  Recent Labs  11/21/15 2044 11/22/15 0805  WBC 3.0* 2.4*  HGB 9.1* 8.9*  HCT 29.5* 27.8*  MCV 94.6 93.9  PLT 138* 142*   Cardiac Enzymes:  Recent Labs  11/21/15 2044 11/22/15 0218 11/22/15 0805  TROPONINI <0.03 <0.03 0.05*   Thyroid  Function Tests:  Recent Labs  11/21/15 2026  TSH 0.680   Anemia Panel:  Recent Labs  11/21/15 2044  VITAMINB12 1,799*  FOLATE 13.8  FERRITIN 48  TIBC 251  IRON 29  RETICCTPCT 0.8      ASSESSMENT AND PLAN:   1. Symptomatic bradycardia, variable heart block     No reversible causes found     No rate limiting/nodal blocking agents at home or here     Given hx of syncope, and bradycardic rates with Mobitz I, variable heart block, recommend PPM.     Dr. Graciela HusbandsKlein discussed risk/benefits of the procedure, the patient would like to proceed, the patient this morning has no new questions, remains agreeable for PPM implant today with Dr. Johney FrameAllred     She is asymptomatic at rest at this time, her BP stable      PPM planned for today    2. HTN     stable  3. Longstanding neutropenia and anemia   Francis DowseRenee Ursuy, PA-C 11/23/2015 8:52 AM   I have seen, examined the patient, and reviewed the above assessment and plan.  Changes to above are made where necessary.   On exam, pleasant  Elderly female. The patient has symptomatic second degree AV block.  Echo is personally reviewed.  EF is normal, no  significant valvular disease.  I would therefore recommend pacemaker implantation at this time.  Risks, benefits, alternatives to pacemaker implantation were discussed in detail with the patient today, her 2 daughters, and grandson today. The patient and family understand that the risks include but are not limited to bleeding, infection, pneumothorax, perforation, tamponade, vascular damage, renal failure, MI, stroke, death,  and lead dislodgement and wishes to proceed at this time.   Co Sign: Hillis RangeJames Mihira Tozzi, MD 11/23/2015 12:17 PM

## 2015-11-23 NOTE — Progress Notes (Signed)
Report received in patient's room and all updateds given as far as orders, VS, new meds and POC, assumed care of patient.

## 2015-11-23 NOTE — Plan of Care (Signed)
Problem: Education: Goal: Knowledge of disease or condition will improve Outcome: Progressing Reviewed all instructions regarding pacemaker insertion and all questions answered and patient verbalized understanding.

## 2015-11-23 NOTE — H&P (View-Only) (Signed)
  SUBJECTIVE: The patient is doing well today.  At this time, she denies chest pain, shortness of breath, or any new concerns.  . acetaminophen  650 mg Oral BID  . aspirin EC  81 mg Oral Daily  . calcium-vitamin D  1 tablet Oral BID  .  ceFAZolin (ANCEF) IV  2 g Intravenous On Call  .  ceFAZolin (ANCEF) IV  2 g Intravenous On Call  . chlorhexidine  60 mL Topical Once  . cyanocobalamin  500 mcg Oral Q breakfast  . gentamicin irrigation  80 mg Irrigation On Call  . losartan  50 mg Oral Q breakfast   And  . hydrochlorothiazide  12.5 mg Oral Q breakfast  . metoCLOPramide  5 mg Oral Q supper  . NIFEdipine  30 mg Oral QHS  . oxybutynin  5 mg Oral QHS  . pantoprazole  40 mg Oral Q breakfast  . polyvinyl alcohol  1 drop Both Eyes BID  . simvastatin  20 mg Oral QHS  . sodium chloride flush  3 mL Intravenous Q12H   . sodium chloride    . sodium chloride 50 mL/hr at 11/23/15 0540    OBJECTIVE: Physical Exam: Vitals:   11/22/15 1640 11/22/15 2018 11/23/15 0019 11/23/15 0453  BP: (!) 140/52 (!) 144/32 (!) 137/41 (!) 128/51  Pulse: (!) 58 (!) 54 (!) 56 (!) 39  Resp: 16 16 15 15  Temp: 98.3 F (36.8 C) 98.2 F (36.8 C) 97.8 F (36.6 C) 97.8 F (36.6 C)  TempSrc: Oral Oral Oral Oral  SpO2: 100% 96% 100% 100%  Weight:    151 lb 12.8 oz (68.9 kg)  Height:        Intake/Output Summary (Last 24 hours) at 11/23/15 0852 Last data filed at 11/22/15 2200  Gross per 24 hour  Intake              360 ml  Output              900 ml  Net             -540 ml    Telemetry reveals SR, second degree AV block continues, generally 40's, down to 30's with occasional 2:1 condiction  GEN- The patient is elderly appearing, alert and oriented x 3 today.   Head- normocephalic, atraumatic Eyes-  Sclera clear, conjunctiva pink Ears- hearing intact Oropharynx- clear Neck- supple, no JVP Lungs- Clear to ausculation bilaterally, normal work of breathing Heart- bradycardic regular rhythm GI- soft,  NT, ND Extremities- no clubbing, cyanosis, or edema Skin- no rash or lesion Psych- euthymic mood, full affect Neuro- no gross deficits appreciated  LABS: Basic Metabolic Panel:  Recent Labs  11/21/15 1722 11/21/15 2044 11/22/15 0805  NA 137 135 136  K 3.8 4.3 3.7  CL 102 102 100*  CO2 28 27 27  GLUCOSE 100* 126* 92  BUN 22* 19 17  CREATININE 1.27* 1.13* 1.04*  CALCIUM 9.5 9.4 9.4  MG 1.8  --   --    Liver Function Tests:  Recent Labs  11/21/15 2044  AST 23  ALT 12*  ALKPHOS 75  BILITOT 0.4  PROT 7.3  ALBUMIN 3.4*   CBC:  Recent Labs  11/21/15 2044 11/22/15 0805  WBC 3.0* 2.4*  HGB 9.1* 8.9*  HCT 29.5* 27.8*  MCV 94.6 93.9  PLT 138* 142*   Cardiac Enzymes:  Recent Labs  11/21/15 2044 11/22/15 0218 11/22/15 0805  TROPONINI <0.03 <0.03 0.05*   Thyroid   Function Tests:  Recent Labs  11/21/15 2026  TSH 0.680   Anemia Panel:  Recent Labs  11/21/15 2044  VITAMINB12 1,799*  FOLATE 13.8  FERRITIN 48  TIBC 251  IRON 29  RETICCTPCT 0.8      ASSESSMENT AND PLAN:   1. Symptomatic bradycardia, variable heart block     No reversible causes found     No rate limiting/nodal blocking agents at home or here     Given hx of syncope, and bradycardic rates with Mobitz I, variable heart block, recommend PPM.     Dr. Klein discussed risk/benefits of the procedure, the patient would like to proceed, the patient this morning has no new questions, remains agreeable for PPM implant today with Dr. Caresse Sedivy     She is asymptomatic at rest at this time, her BP stable      PPM planned for today    2. HTN     stable  3. Longstanding neutropenia and anemia   Renee Ursuy, PA-C 11/23/2015 8:52 AM   I have seen, examined the patient, and reviewed the above assessment and plan.  Changes to above are made where necessary.   On exam, pleasant  Elderly female. The patient has symptomatic second degree AV block.  Echo is personally reviewed.  EF is normal, no  significant valvular disease.  I would therefore recommend pacemaker implantation at this time.  Risks, benefits, alternatives to pacemaker implantation were discussed in detail with the patient today, her 2 daughters, and grandson today. The patient and family understand that the risks include but are not limited to bleeding, infection, pneumothorax, perforation, tamponade, vascular damage, renal failure, MI, stroke, death,  and lead dislodgement and wishes to proceed at this time.   Co Sign: Irean Kendricks, MD 11/23/2015 12:17 PM   

## 2015-11-23 NOTE — Progress Notes (Signed)
  Echocardiogram 2D Echocardiogram has been performed.  Arvil ChacoFoster, Kynzi Levay 11/23/2015, 10:37 AM

## 2015-11-24 ENCOUNTER — Inpatient Hospital Stay (HOSPITAL_COMMUNITY): Payer: Commercial Managed Care - HMO

## 2015-11-24 LAB — BASIC METABOLIC PANEL
Anion gap: 8 (ref 5–15)
BUN: 10 mg/dL (ref 6–20)
CALCIUM: 9 mg/dL (ref 8.9–10.3)
CO2: 26 mmol/L (ref 22–32)
Chloride: 100 mmol/L — ABNORMAL LOW (ref 101–111)
Creatinine, Ser: 0.99 mg/dL (ref 0.44–1.00)
GFR, EST AFRICAN AMERICAN: 58 mL/min — AB (ref >60)
GFR, EST NON AFRICAN AMERICAN: 50 mL/min — AB (ref >60)
Glucose, Bld: 87 mg/dL (ref 65–99)
Potassium: 3.7 mmol/L (ref 3.5–5.1)
SODIUM: 134 mmol/L — AB (ref 135–145)

## 2015-11-24 LAB — GLUCOSE, CAPILLARY: GLUCOSE-CAPILLARY: 95 mg/dL (ref 65–99)

## 2015-11-24 NOTE — Progress Notes (Signed)
Pt discharged home. Discharge instructions have been gone over with the patient. IV's removed. Pt given unit number and told to call if they have any concerns regarding their discharge instructions. Takyia Sindt V, RN   

## 2015-11-24 NOTE — Discharge Instructions (Addendum)
° ° °  Supplemental Discharge Instructions for  Pacemaker/Defibrillator Patients  Activity No heavy lifting or vigorous activity with your left/right arm for 6 to 8 weeks.  Do not raise your left/right arm above your head for one week.  Gradually raise your affected arm as drawn below.             11/27/15                     11/28/15                    11/29/15                  11/30/15 __  NO DRIVING (the patient no longer drives)   WOUND CARE - Keep the wound area clean and dry.  Do not get this area wet for one week. No showers for one week; you may shower on 11/30/15    . - The tape/steri-strips on your wound will fall off; do not pull them off.  No bandage is needed on the site.  DO  NOT apply any creams, oils, or ointments to the wound area. - If you notice any drainage or discharge from the wound, any swelling or bruising at the site, or you develop a fever > 101? F after you are discharged home, call the office at once.  Special Instructions - You are still able to use cellular telephones; use the ear opposite the side where you have your pacemaker/defibrillator.  Avoid carrying your cellular phone near your device. - When traveling through airports, show security personnel your identification card to avoid being screened in the metal detectors.  Ask the security personnel to use the hand wand. - Avoid arc welding equipment, MRI testing (magnetic resonance imaging), TENS units (transcutaneous nerve stimulators).  Call the office for questions about other devices. - Avoid electrical appliances that are in poor condition or are not properly grounded. - Microwave ovens are safe to be near or to operate.  Additional information for defibrillator patients should your device go off: - If your device goes off ONCE and you feel fine afterward, notify the device clinic nurses. - If your device goes off ONCE and you do not feel well afterward, call 911. - If your device goes off TWICE, call  911. - If your device goes off THREE times in one day, call 911.  DO NOT DRIVE YOURSELF OR A FAMILY MEMBER WITH A DEFIBRILLATOR TO THE HOSPITAL--CALL 911.

## 2015-11-29 ENCOUNTER — Encounter (HOSPITAL_COMMUNITY): Payer: Self-pay | Admitting: *Deleted

## 2015-12-06 ENCOUNTER — Ambulatory Visit (INDEPENDENT_AMBULATORY_CARE_PROVIDER_SITE_OTHER): Payer: Commercial Managed Care - HMO | Admitting: *Deleted

## 2015-12-06 ENCOUNTER — Encounter: Payer: Self-pay | Admitting: Internal Medicine

## 2015-12-06 DIAGNOSIS — R001 Bradycardia, unspecified: Secondary | ICD-10-CM

## 2015-12-06 NOTE — Progress Notes (Signed)
Wound check appointment. Steri-strips previously removed. Wound without redness. Small soft hematoma noted, pt instructed to call if worsens. Incision edges approximated, wound well healed. Normal device function. Thresholds, sensing, and impedances consistent with implant measurements. Device programmed at 3.5Vfor extra safety margin until 3 month visit. Histogram distribution appropriate for patient and level of activity. No mode switches or high ventricular rates noted. Patient educated about wound care, arm mobility, lifting restrictions. ROV 03/01/16 with Fawn KirkJA

## 2015-12-15 ENCOUNTER — Other Ambulatory Visit: Payer: Self-pay | Admitting: Internal Medicine

## 2015-12-15 DIAGNOSIS — Z1231 Encounter for screening mammogram for malignant neoplasm of breast: Secondary | ICD-10-CM

## 2016-01-26 ENCOUNTER — Ambulatory Visit: Payer: Commercial Managed Care - HMO

## 2016-02-01 ENCOUNTER — Ambulatory Visit
Admission: RE | Admit: 2016-02-01 | Discharge: 2016-02-01 | Disposition: A | Discharge status: Home / Self Care | Payer: Commercial Managed Care - HMO | Source: Ambulatory Visit | Attending: Internal Medicine | Admitting: Internal Medicine

## 2016-02-01 DIAGNOSIS — Z1231 Encounter for screening mammogram for malignant neoplasm of breast: Secondary | ICD-10-CM

## 2016-02-12 ENCOUNTER — Telehealth: Payer: Self-pay | Admitting: Internal Medicine

## 2016-03-01 ENCOUNTER — Encounter: Payer: Commercial Managed Care - HMO | Admitting: Internal Medicine

## 2016-03-04 ENCOUNTER — Ambulatory Visit (INDEPENDENT_AMBULATORY_CARE_PROVIDER_SITE_OTHER): Payer: Commercial Managed Care - HMO | Admitting: Internal Medicine

## 2016-03-04 ENCOUNTER — Encounter (INDEPENDENT_AMBULATORY_CARE_PROVIDER_SITE_OTHER): Payer: Self-pay

## 2016-03-04 ENCOUNTER — Encounter: Payer: Self-pay | Admitting: Internal Medicine

## 2016-03-04 VITALS — BP 164/74 | HR 60 | Ht 63.0 in | Wt 154.4 lb

## 2016-03-04 DIAGNOSIS — R001 Bradycardia, unspecified: Secondary | ICD-10-CM

## 2016-03-04 DIAGNOSIS — I10 Essential (primary) hypertension: Secondary | ICD-10-CM

## 2016-03-04 DIAGNOSIS — I441 Atrioventricular block, second degree: Secondary | ICD-10-CM

## 2016-03-04 DIAGNOSIS — I442 Atrioventricular block, complete: Secondary | ICD-10-CM

## 2016-03-04 LAB — CUP PACEART INCLINIC DEVICE CHECK
Brady Statistic RA Percent Paced: 46 %
Brady Statistic RV Percent Paced: 99.98 %
Date Time Interrogation Session: 20180108172751
Implantable Lead Location: 753859
Lead Channel Impedance Value: 400 Ohm
Lead Channel Pacing Threshold Amplitude: 0.5 V
Lead Channel Pacing Threshold Amplitude: 0.5 V
Lead Channel Pacing Threshold Pulse Width: 0.4 ms
Lead Channel Pacing Threshold Pulse Width: 0.4 ms
Lead Channel Sensing Intrinsic Amplitude: 7.8 mV
Lead Channel Setting Pacing Amplitude: 2 V
MDC IDC LEAD IMPLANT DT: 20170928
MDC IDC LEAD IMPLANT DT: 20170928
MDC IDC LEAD LOCATION: 753862
MDC IDC MSMT BATTERY VOLTAGE: 3.01 V
MDC IDC MSMT LEADCHNL RA IMPEDANCE VALUE: 400 Ohm
MDC IDC MSMT LEADCHNL RA PACING THRESHOLD PULSEWIDTH: 0.4 ms
MDC IDC MSMT LEADCHNL RA SENSING INTR AMPL: 4.3 mV
MDC IDC MSMT LEADCHNL RV PACING THRESHOLD AMPLITUDE: 1 V
MDC IDC MSMT LEADCHNL RV PACING THRESHOLD AMPLITUDE: 1 V
MDC IDC MSMT LEADCHNL RV PACING THRESHOLD PULSEWIDTH: 0.4 ms
MDC IDC PG IMPLANT DT: 20170928
MDC IDC SET LEADCHNL RV PACING AMPLITUDE: 2.5 V
MDC IDC SET LEADCHNL RV PACING PULSEWIDTH: 0.4 ms
MDC IDC SET LEADCHNL RV SENSING SENSITIVITY: 2 mV
Pulse Gen Model: 2272
Pulse Gen Serial Number: 7951003

## 2016-03-04 NOTE — Progress Notes (Signed)
PCP: Garlan FillersPATERSON,DANIEL G, MD  [name] is a 83 y.o. female who presents today for routine electrophysiology followup.  Since her recent PPM implant, the patient reports doing very well.  She denies procedure related complications.  Today, she denies symptoms of palpitations, chest pain, shortness of breath,  lower extremity edema, dizziness, presyncope, or syncope.  The patient is otherwise without complaint today.   Past Medical History:  Diagnosis Date  . Arthritis   . Bradycardia   . Dyslipidemia   . High cholesterol   . Hypertension   . S/P hysterectomy   . Syncope    Past Surgical History:  Procedure Laterality Date  . ABDOMINAL HYSTERECTOMY    . EP IMPLANTABLE DEVICE N/A 11/23/2015   S[employer] DR pacemaker implanted by Dr Johney FrameAllred for second degree AV block with presyncope  . implantation of implantable loop recorder    . LOOP RECORDER EXPLANT N/A 03/13/2012   Procedure: LOOP RECORDER EXPLANT;  Surgeon: Marinus MawGregg W Taylor, MD;  [facility];  Service: Cardiovascular;  Laterality: N/A;  . REPLACEMENT TOTAL KNEE Left     ROS- all systems are reviewed and negative except as per HPI above  Current Outpatient Prescriptions  Medication Sig Dispense Refill  . acetaminophen (TYLENOL) 650 MG CR tablet Take 650 mg by mouth 2 (two) times daily.    Marland Kitchen. aspirin EC 81 MG tablet Take 81 mg by mouth daily with breakfast.     . calcium-vitamin D (OSCAL WITH D) 500-200 MG-UNIT per tablet Take 1 tablet by mouth 2 (two) times daily.    Marland Kitchen. docusate sodium (COLACE) 100 MG capsule Take 100 mg by mouth daily as needed for mild constipation.    . ergocalciferol (VITAMIN D2) 50000 UNITS capsule Take 50,000 Units by mouth every 14 (fourteen) days.    Marland Kitchen. losartan-hydrochlorothiazide (HYZAAR) 50-12.5 MG tablet Take 1 tablet by mouth daily with breakfast.   0  . metoCLOPramide (REGLAN) 5 MG tablet Take 1 tablet by mouth daily with supper.  6  . NIFEdipine (PROCARDIA-XL/ADALAT-CC/NIFEDICAL-XL)  30 MG 24 hr tablet Take 1 tablet (30 mg total) by mouth daily. (Patient taking differently: Take 30 mg by mouth at bedtime. ) 90 tablet 3  . oxybutynin (DITROPAN) 5 MG tablet Take 5 mg by mouth at bedtime.     . pantoprazole (PROTONIX) 40 MG tablet Take 40 mg by mouth daily with breakfast.    . Polyethyl Glycol-Propyl Glycol (SYSTANE) 0.4-0.3 % SOLN Place 1 drop into both eyes 2 (two) times daily.    . simvastatin (ZOCOR) 20 MG tablet Take 20 mg by mouth at bedtime.     . vitamin B-12 (CYANOCOBALAMIN) 500 MCG tablet Take 500 mcg by mouth daily with breakfast.     No current facility-administered medications for this visit.     Physical Exam: Vitals:   03/04/16 1530  BP: (!) 164/74  Pulse: 60  Weight: 154 lb 6.4 oz (70 kg)  Height: 5\' 3"  (1.6 m)    GEN- The patient is well appearing, alert and oriented x 3 today.   Head- normocephalic, atraumatic Eyes-  Sclera clear, conjunctiva pink Ears- hearing intact Oropharynx- clear Lungs- Clear to ausculation bilaterally, normal work of breathing Chest- pacemaker pocket is well healed Heart- Regular rate and rhythm, no murmurs, rubs or gallops, PMI not laterally displaced GI- soft, NT, ND, + BS Extremities- no clubbing, cyanosis, or edema  Pacemaker interrogation- reviewed in detail today,  See PACEART report ekg today reveals AV paced  rhythm  Assessment and Plan:  1. Second degree AV block Normal pacemaker function See Pace Art report No changes today No further dizziness  2. HTN Elevated today She reports good BP control at home and is not interested in additional medicines at this time  Merlin Return to see EP NP in 1 year  Hillis Range MD, [facility] 03/04/2016 4:20 PM

## 2016-03-04 NOTE — Patient Instructions (Signed)
Medication Instructions:  Your physician recommends that you continue on your current medications as directed. Please refer to the Current Medication list given to you today.   Labwork: None ordered   Testing/Procedures: None ordered   Follow-Up: Remote monitoring is used to monitor your Pacemaker  from home. This monitoring reduces the number of office visits required to check your device to one time per year. It allows us to keep an eye on the functioning of your device to ensure it is working properly. You are scheduled for a device check from home on 06/03/16. You may send your transmission at any time that day. If you have a wireless device, the transmission will be sent automatically. After your physician reviews your transmission, you will receive a postcard with your next transmission date.   Your physician wants you to follow-up in: 12 months with Gypsy BalsamAmber Seiler, NP You will receive a reminder letter in the mail two months in advance. If you don't receive a letter, please call our office to schedule the follow-up appointment.     Any Other Special Instructions Will Be Listed Below (If Applicable).     If you need a refill on your cardiac medications before your next appointment, please call your pharmacy.

## 2016-04-26 DIAGNOSIS — E559 Vitamin D deficiency, unspecified: Secondary | ICD-10-CM | POA: Diagnosis not present

## 2016-04-26 DIAGNOSIS — R8299 Other abnormal findings in urine: Secondary | ICD-10-CM | POA: Diagnosis not present

## 2016-04-26 DIAGNOSIS — E784 Other hyperlipidemia: Secondary | ICD-10-CM | POA: Diagnosis not present

## 2016-04-26 DIAGNOSIS — N390 Urinary tract infection, site not specified: Secondary | ICD-10-CM | POA: Diagnosis not present

## 2016-04-26 DIAGNOSIS — I10 Essential (primary) hypertension: Secondary | ICD-10-CM | POA: Diagnosis not present

## 2016-05-03 DIAGNOSIS — N3281 Overactive bladder: Secondary | ICD-10-CM | POA: Diagnosis not present

## 2016-05-03 DIAGNOSIS — E784 Other hyperlipidemia: Secondary | ICD-10-CM | POA: Diagnosis not present

## 2016-05-03 DIAGNOSIS — K449 Diaphragmatic hernia without obstruction or gangrene: Secondary | ICD-10-CM | POA: Diagnosis not present

## 2016-05-03 DIAGNOSIS — Z0000 Encounter for general adult medical examination without abnormal findings: Secondary | ICD-10-CM | POA: Diagnosis not present

## 2016-05-03 DIAGNOSIS — M25562 Pain in left knee: Secondary | ICD-10-CM | POA: Diagnosis not present

## 2016-05-03 DIAGNOSIS — M810 Age-related osteoporosis without current pathological fracture: Secondary | ICD-10-CM | POA: Diagnosis not present

## 2016-05-03 DIAGNOSIS — R251 Tremor, unspecified: Secondary | ICD-10-CM | POA: Diagnosis not present

## 2016-05-03 DIAGNOSIS — I10 Essential (primary) hypertension: Secondary | ICD-10-CM | POA: Diagnosis not present

## 2016-05-03 DIAGNOSIS — M25561 Pain in right knee: Secondary | ICD-10-CM | POA: Diagnosis not present

## 2016-06-03 ENCOUNTER — Ambulatory Visit (INDEPENDENT_AMBULATORY_CARE_PROVIDER_SITE_OTHER): Payer: Medicare PPO | Admitting: *Deleted

## 2016-06-03 ENCOUNTER — Telehealth: Payer: Self-pay | Admitting: Cardiology

## 2016-06-03 DIAGNOSIS — I442 Atrioventricular block, complete: Secondary | ICD-10-CM

## 2016-06-03 NOTE — Telephone Encounter (Signed)
Spoke with pt and reminded pt of remote transmission that is due today. Pt verbalized understanding.   

## 2016-06-03 NOTE — Progress Notes (Signed)
Remote pacemaker transmission.   

## 2016-06-05 ENCOUNTER — Encounter: Payer: Self-pay | Admitting: Cardiology

## 2016-06-07 LAB — CUP PACEART REMOTE DEVICE CHECK
Battery Remaining Percentage: 95.5 %
Brady Statistic AP VP Percent: 44 %
Brady Statistic AP VS Percent: 1 %
Brady Statistic AS VP Percent: 56 %
Brady Statistic AS VS Percent: 1 %
Brady Statistic RV Percent Paced: 99 %
Implantable Lead Implant Date: 20170928
Implantable Lead Location: 753860
Implantable Lead Model: 5076
Lead Channel Impedance Value: 380 Ohm
Lead Channel Pacing Threshold Amplitude: 1 V
Lead Channel Pacing Threshold Pulse Width: 0.4 ms
Lead Channel Sensing Intrinsic Amplitude: 12 mV
Lead Channel Sensing Intrinsic Amplitude: 3.8 mV
Lead Channel Setting Pacing Amplitude: 2 V
Lead Channel Setting Pacing Amplitude: 2.5 V
Lead Channel Setting Pacing Pulse Width: 0.4 ms
Lead Channel Setting Sensing Sensitivity: 2 mV
MDC IDC LEAD IMPLANT DT: 20170928
MDC IDC LEAD LOCATION: 753859
MDC IDC MSMT BATTERY REMAINING LONGEVITY: 100 mo
MDC IDC MSMT BATTERY VOLTAGE: 3.02 V
MDC IDC MSMT LEADCHNL RA IMPEDANCE VALUE: 380 Ohm
MDC IDC MSMT LEADCHNL RA PACING THRESHOLD AMPLITUDE: 0.5 V
MDC IDC MSMT LEADCHNL RA PACING THRESHOLD PULSEWIDTH: 0.4 ms
MDC IDC PG IMPLANT DT: 20170928
MDC IDC SESS DTM: 20180409162549
MDC IDC STAT BRADY RA PERCENT PACED: 44 %
Pulse Gen Model: 2272
Pulse Gen Serial Number: 7951003

## 2016-07-02 DIAGNOSIS — R300 Dysuria: Secondary | ICD-10-CM | POA: Diagnosis not present

## 2016-07-02 DIAGNOSIS — M545 Low back pain: Secondary | ICD-10-CM | POA: Diagnosis not present

## 2016-07-02 DIAGNOSIS — I10 Essential (primary) hypertension: Secondary | ICD-10-CM | POA: Diagnosis not present

## 2016-07-02 DIAGNOSIS — R251 Tremor, unspecified: Secondary | ICD-10-CM | POA: Diagnosis not present

## 2016-07-02 DIAGNOSIS — Z6827 Body mass index (BMI) 27.0-27.9, adult: Secondary | ICD-10-CM | POA: Diagnosis not present

## 2016-07-23 ENCOUNTER — Other Ambulatory Visit: Payer: Self-pay | Admitting: Internal Medicine

## 2016-07-23 DIAGNOSIS — I119 Hypertensive heart disease without heart failure: Secondary | ICD-10-CM

## 2016-07-25 ENCOUNTER — Other Ambulatory Visit: Payer: Self-pay | Admitting: Internal Medicine

## 2016-07-25 DIAGNOSIS — I119 Hypertensive heart disease without heart failure: Secondary | ICD-10-CM

## 2016-07-26 NOTE — Telephone Encounter (Signed)
Medication Detail    Disp Refills Start End   NIFEdipine (PROCARDIA-XL/ADALAT-CC/NIFEDICAL-XL) 30 MG 24 hr tablet 90 tablet 2 07/25/2016    Sig - Route: Take 1 tablet (30 mg total) by mouth daily. - Oral   E-Prescribing Status: Receipt confirmed by pharmacy (07/25/2016 10:20 AM EDT)   Associated Diagnoses   Hypertensive heart disease without heart failure     Pharmacy   [id number] - [place], [place] - [address] AT [address]

## 2016-09-02 ENCOUNTER — Ambulatory Visit (INDEPENDENT_AMBULATORY_CARE_PROVIDER_SITE_OTHER): Payer: Medicare PPO | Admitting: *Deleted

## 2016-09-02 DIAGNOSIS — I442 Atrioventricular block, complete: Secondary | ICD-10-CM

## 2016-09-02 NOTE — Progress Notes (Signed)
Remote pacemaker transmission.   

## 2016-09-04 LAB — CUP PACEART REMOTE DEVICE CHECK
Battery Remaining Longevity: 98 mo
Battery Remaining Percentage: 95.5 %
Battery Voltage: 3.02 V
Brady Statistic AS VS Percent: 1 %
Brady Statistic RV Percent Paced: 99 %
Date Time Interrogation Session: 20180709060152
Implantable Lead Implant Date: 20170928
Implantable Pulse Generator Implant Date: 20170928
Lead Channel Impedance Value: 400 Ohm
Lead Channel Pacing Threshold Amplitude: 0.5 V
Lead Channel Pacing Threshold Pulse Width: 0.4 ms
Lead Channel Sensing Intrinsic Amplitude: 4.4 mV
Lead Channel Setting Pacing Amplitude: 2.5 V
Lead Channel Setting Sensing Sensitivity: 2 mV
MDC IDC LEAD IMPLANT DT: 20170928
MDC IDC LEAD LOCATION: 753859
MDC IDC LEAD LOCATION: 753860
MDC IDC MSMT LEADCHNL RA PACING THRESHOLD PULSEWIDTH: 0.4 ms
MDC IDC MSMT LEADCHNL RV IMPEDANCE VALUE: 390 Ohm
MDC IDC MSMT LEADCHNL RV PACING THRESHOLD AMPLITUDE: 1 V
MDC IDC MSMT LEADCHNL RV SENSING INTR AMPL: 7.3 mV
MDC IDC PG SERIAL: 7951003
MDC IDC SET LEADCHNL RA PACING AMPLITUDE: 2 V
MDC IDC SET LEADCHNL RV PACING PULSEWIDTH: 0.4 ms
MDC IDC STAT BRADY AP VP PERCENT: 47 %
MDC IDC STAT BRADY AP VS PERCENT: 1 %
MDC IDC STAT BRADY AS VP PERCENT: 53 %
MDC IDC STAT BRADY RA PERCENT PACED: 47 %
Pulse Gen Model: 2272

## 2016-09-06 ENCOUNTER — Encounter: Payer: Self-pay | Admitting: Cardiology

## 2016-10-21 DIAGNOSIS — K219 Gastro-esophageal reflux disease without esophagitis: Secondary | ICD-10-CM | POA: Diagnosis not present

## 2016-10-21 DIAGNOSIS — R251 Tremor, unspecified: Secondary | ICD-10-CM | POA: Diagnosis not present

## 2016-10-21 DIAGNOSIS — Z6826 Body mass index (BMI) 26.0-26.9, adult: Secondary | ICD-10-CM | POA: Diagnosis not present

## 2016-10-21 DIAGNOSIS — I10 Essential (primary) hypertension: Secondary | ICD-10-CM | POA: Diagnosis not present

## 2016-10-21 DIAGNOSIS — N3281 Overactive bladder: Secondary | ICD-10-CM | POA: Diagnosis not present

## 2016-10-21 DIAGNOSIS — M545 Low back pain: Secondary | ICD-10-CM | POA: Diagnosis not present

## 2016-12-02 ENCOUNTER — Ambulatory Visit (INDEPENDENT_AMBULATORY_CARE_PROVIDER_SITE_OTHER): Payer: Medicare PPO | Admitting: *Deleted

## 2016-12-02 DIAGNOSIS — I442 Atrioventricular block, complete: Secondary | ICD-10-CM | POA: Diagnosis not present

## 2016-12-03 DIAGNOSIS — E785 Hyperlipidemia, unspecified: Secondary | ICD-10-CM | POA: Diagnosis not present

## 2016-12-03 DIAGNOSIS — I10 Essential (primary) hypertension: Secondary | ICD-10-CM | POA: Diagnosis not present

## 2016-12-03 DIAGNOSIS — Z950 Presence of cardiac pacemaker: Secondary | ICD-10-CM | POA: Diagnosis not present

## 2016-12-03 DIAGNOSIS — G20 Parkinson's disease: Secondary | ICD-10-CM | POA: Diagnosis not present

## 2016-12-03 NOTE — Progress Notes (Signed)
Remote pacemaker transmission.   

## 2016-12-06 ENCOUNTER — Encounter: Payer: Self-pay | Admitting: Cardiology

## 2016-12-23 LAB — CUP PACEART REMOTE DEVICE CHECK
Battery Remaining Longevity: 104 mo
Brady Statistic AP VP Percent: 50 %
Brady Statistic AP VS Percent: 1 %
Brady Statistic AS VP Percent: 50 %
Brady Statistic AS VS Percent: 1 %
Date Time Interrogation Session: 20181008104152
Implantable Lead Implant Date: 20170928
Implantable Lead Location: 753859
Lead Channel Impedance Value: 380 Ohm
Lead Channel Impedance Value: 390 Ohm
Lead Channel Pacing Threshold Pulse Width: 0.4 ms
Lead Channel Pacing Threshold Pulse Width: 0.4 ms
Lead Channel Sensing Intrinsic Amplitude: 8.5 mV
Lead Channel Setting Pacing Amplitude: 2 V
Lead Channel Setting Pacing Amplitude: 2.5 V
Lead Channel Setting Sensing Sensitivity: 2 mV
MDC IDC LEAD IMPLANT DT: 20170928
MDC IDC LEAD LOCATION: 753860
MDC IDC MSMT BATTERY REMAINING PERCENTAGE: 95.5 %
MDC IDC MSMT BATTERY VOLTAGE: 3.02 V
MDC IDC MSMT LEADCHNL RA PACING THRESHOLD AMPLITUDE: 0.5 V
MDC IDC MSMT LEADCHNL RA SENSING INTR AMPL: 2.8 mV
MDC IDC MSMT LEADCHNL RV PACING THRESHOLD AMPLITUDE: 1 V
MDC IDC PG IMPLANT DT: 20170928
MDC IDC SET LEADCHNL RV PACING PULSEWIDTH: 0.4 ms
MDC IDC STAT BRADY RA PERCENT PACED: 50 %
MDC IDC STAT BRADY RV PERCENT PACED: 99 %
Pulse Gen Serial Number: 7951003

## 2016-12-25 DIAGNOSIS — H5213 Myopia, bilateral: Secondary | ICD-10-CM | POA: Diagnosis not present

## 2016-12-26 ENCOUNTER — Other Ambulatory Visit: Payer: Self-pay | Admitting: Internal Medicine

## 2016-12-26 DIAGNOSIS — Z1231 Encounter for screening mammogram for malignant neoplasm of breast: Secondary | ICD-10-CM

## 2017-01-29 ENCOUNTER — Inpatient Hospital Stay (HOSPITAL_COMMUNITY)
Admission: EM | Admit: 2017-01-29 | Discharge: 2017-01-31 | DRG: 392 | Disposition: A | Discharge status: Home Health | Payer: Medicare Other | Attending: Internal Medicine | Admitting: Internal Medicine

## 2017-01-29 ENCOUNTER — Other Ambulatory Visit: Payer: Self-pay

## 2017-01-29 ENCOUNTER — Encounter (HOSPITAL_COMMUNITY): Payer: Self-pay | Admitting: Emergency Medicine

## 2017-01-29 DIAGNOSIS — M1990 Unspecified osteoarthritis, unspecified site: Secondary | ICD-10-CM | POA: Diagnosis not present

## 2017-01-29 DIAGNOSIS — I951 Orthostatic hypotension: Secondary | ICD-10-CM | POA: Diagnosis present

## 2017-01-29 DIAGNOSIS — R55 Syncope and collapse: Secondary | ICD-10-CM | POA: Diagnosis present

## 2017-01-29 DIAGNOSIS — Z791 Long term (current) use of non-steroidal anti-inflammatories (NSAID): Secondary | ICD-10-CM | POA: Diagnosis not present

## 2017-01-29 DIAGNOSIS — Z90710 Acquired absence of both cervix and uterus: Secondary | ICD-10-CM | POA: Diagnosis not present

## 2017-01-29 DIAGNOSIS — I442 Atrioventricular block, complete: Secondary | ICD-10-CM | POA: Diagnosis present

## 2017-01-29 DIAGNOSIS — E876 Hypokalemia: Secondary | ICD-10-CM | POA: Diagnosis present

## 2017-01-29 DIAGNOSIS — Z79899 Other long term (current) drug therapy: Secondary | ICD-10-CM | POA: Diagnosis not present

## 2017-01-29 DIAGNOSIS — E785 Hyperlipidemia, unspecified: Secondary | ICD-10-CM | POA: Diagnosis present

## 2017-01-29 DIAGNOSIS — E7800 Pure hypercholesterolemia, unspecified: Secondary | ICD-10-CM | POA: Diagnosis present

## 2017-01-29 DIAGNOSIS — I739 Peripheral vascular disease, unspecified: Secondary | ICD-10-CM | POA: Diagnosis present

## 2017-01-29 DIAGNOSIS — E871 Hypo-osmolality and hyponatremia: Secondary | ICD-10-CM | POA: Diagnosis not present

## 2017-01-29 DIAGNOSIS — K529 Noninfective gastroenteritis and colitis, unspecified: Principal | ICD-10-CM

## 2017-01-29 DIAGNOSIS — Z950 Presence of cardiac pacemaker: Secondary | ICD-10-CM

## 2017-01-29 DIAGNOSIS — Z7982 Long term (current) use of aspirin: Secondary | ICD-10-CM | POA: Diagnosis not present

## 2017-01-29 DIAGNOSIS — K6389 Other specified diseases of intestine: Secondary | ICD-10-CM | POA: Diagnosis not present

## 2017-01-29 DIAGNOSIS — D649 Anemia, unspecified: Secondary | ICD-10-CM | POA: Diagnosis not present

## 2017-01-29 DIAGNOSIS — N289 Disorder of kidney and ureter, unspecified: Secondary | ICD-10-CM | POA: Diagnosis not present

## 2017-01-29 DIAGNOSIS — A049 Bacterial intestinal infection, unspecified: Secondary | ICD-10-CM | POA: Diagnosis present

## 2017-01-29 DIAGNOSIS — I10 Essential (primary) hypertension: Secondary | ICD-10-CM | POA: Diagnosis not present

## 2017-01-29 DIAGNOSIS — Z96651 Presence of right artificial knee joint: Secondary | ICD-10-CM | POA: Diagnosis present

## 2017-01-29 DIAGNOSIS — R404 Transient alteration of awareness: Secondary | ICD-10-CM | POA: Diagnosis not present

## 2017-01-29 DIAGNOSIS — Z23 Encounter for immunization: Secondary | ICD-10-CM

## 2017-01-29 LAB — BASIC METABOLIC PANEL
ANION GAP: 11 (ref 5–15)
BUN: 16 mg/dL (ref 6–20)
CHLORIDE: 95 mmol/L — AB (ref 101–111)
CO2: 26 mmol/L (ref 22–32)
Calcium: 9.1 mg/dL (ref 8.9–10.3)
Creatinine, Ser: 1.19 mg/dL — ABNORMAL HIGH (ref 0.44–1.00)
GFR calc Af Amer: 46 mL/min — ABNORMAL LOW (ref >60)
GFR, EST NON AFRICAN AMERICAN: 40 mL/min — AB (ref >60)
Glucose, Bld: 119 mg/dL — ABNORMAL HIGH (ref 65–99)
POTASSIUM: 3.4 mmol/L — AB (ref 3.5–5.1)
SODIUM: 132 mmol/L — AB (ref 135–145)

## 2017-01-29 LAB — CBG MONITORING, ED: Glucose-Capillary: 92 mg/dL (ref 65–99)

## 2017-01-29 LAB — CBC
HEMATOCRIT: 33 % — AB (ref 36.0–46.0)
HEMOGLOBIN: 10.5 g/dL — AB (ref 12.0–15.0)
MCH: 29.8 pg (ref 26.0–34.0)
MCHC: 31.8 g/dL (ref 30.0–36.0)
MCV: 93.8 fL (ref 78.0–100.0)
Platelets: 142 10*3/uL — ABNORMAL LOW (ref 150–400)
RBC: 3.52 MIL/uL — AB (ref 3.87–5.11)
RDW: 12.3 % (ref 11.5–15.5)
WBC: 5.8 10*3/uL (ref 4.0–10.5)

## 2017-01-29 MED ORDER — SODIUM CHLORIDE 0.9 % IV BOLUS (SEPSIS)
1000.0000 mL | Freq: Once | INTRAVENOUS | Status: AC
Start: 2017-01-29 — End: 2017-01-30
  Administered 2017-01-30: 1000 mL via INTRAVENOUS

## 2017-01-29 NOTE — ED Triage Notes (Signed)
Pt arrived EMS from home after a near syncopal episode. Pt reports she's had several episode of diarrhea starting today around noon. Denies any pain, LOC, but she states that she did fall on her buttocks.

## 2017-01-29 NOTE — ED Provider Notes (Signed)
[facility] EMERGENCY DEPARTMENT Provider Note   CSN: [id number] Arrival date & time: 01/29/17  2206     History   Chief Complaint Chief Complaint  Patient presents with  . Near Syncope    HPI [name] is a 83 y.o. female with history of hyperlipidemia, hypertension, second-degree AV block status post pacemaker presents to ED for evaluation of watery diarrhea for the last 24 hours. States she had an episode of lightheadedness with fall when she was walking from the bathroom to her bedroom, felt lightheaded and her gait was unsteady which ended in fall, landing on her buttocks. States she bumped the  Left side of her head on the nearby dresser. He reports associated neck soreness but no headache. Had shrimp earlier today before diarrhea started.  Denies fevers, chills, abdominal pain, melena, hematochezia, dysuria, hematuria, frequency. Episode of lightheadedness not associated with chest pain, shortness of breath, palpitations, diaphoresis. Has been eating and drinking otherwise okay today with no nausea or vomiting.No recent antibiotic use. No recent hospitalizations. No previous history of C. Difficile, IBD, IBS, diverticulitis. Occasional use of laxatives, none recently. Last colonoscopy over 10 year ago, normal.  HPI  Past Medical History:  Diagnosis Date  . Arthritis   . Dyslipidemia   . High cholesterol   . Hypertension   . S/P hysterectomy   . Second degree AV block    with syncope,  s/p PPM by Dr Johney Frame  . Syncope     Patient Active Problem List   Diagnosis Date Noted  . Bradycardia 11/21/2015  . Complete heart block (HCC) 11/21/2015  . Peripheral vascular disease (HCC) 11/21/2015  . Anemia 11/21/2015  . Hyperlipidemia 07/15/2008  . Hypertensive heart disease     Past Surgical History:  Procedure Laterality Date  . ABDOMINAL HYSTERECTOMY    . EP IMPLANTABLE DEVICE N/A 11/23/2015   SJM [employer] DR pacemaker implanted by Dr Johney Frame for  second degree AV block with presyncope  . implantation of implantable loop recorder    . LOOP RECORDER EXPLANT N/A 03/13/2012   Procedure: LOOP RECORDER EXPLANT;  Surgeon: Marinus Maw, MD;  Location: [facility] CATH LAB;  Service: Cardiovascular;  Laterality: N/A;  . REPLACEMENT TOTAL KNEE Left     OB History    Gravida Para Term Preterm AB Living   0 0 0 0 0     SAB TAB Ectopic Multiple Live Births   0 0 0           Home Medications    Prior to Admission medications   Medication Sig Start Date End Date Taking? Authorizing Provider  acetaminophen (TYLENOL) 650 MG CR tablet Take 650 mg by mouth 2 (two) times daily.    [provider]  aspirin EC 81 MG tablet Take 81 mg by mouth daily with breakfast.     [provider]  calcium-vitamin D (OSCAL WITH D) 500-200 MG-UNIT per tablet Take 1 tablet by mouth 2 (two) times daily.    [provider]  docusate sodium (COLACE) 100 MG capsule Take 100 mg by mouth daily as needed for mild constipation.    [provider]  ergocalciferol (VITAMIN D2) 50000 UNITS capsule Take 50,000 Units by mouth every 14 (fourteen) days.    [provider]  losartan-hydrochlorothiazide (HYZAAR) 50-12.5 MG tablet Take 1 tablet by mouth daily with breakfast.  07/21/15   [provider]  metoCLOPramide (REGLAN) 5 MG tablet Take 1 tablet by mouth daily with supper.  07/29/15   [provider]  NIFEdipine (PROCARDIA-XL/ADALAT-CC/NIFEDICAL-XL) 30 MG 24 hr tablet Take 1 tablet (30 mg total) by mouth daily. 07/25/16   Marinus Mawaylor, Gregg W, MD  oxybutynin (DITROPAN) 5 MG tablet Take 5 mg by mouth at bedtime.  08/02/14   [provider]  pantoprazole (PROTONIX) 40 MG tablet Take 40 mg by mouth daily with breakfast.    [provider]  Polyethyl Glycol-Propyl Glycol (SYSTANE) 0.4-0.3 % SOLN Place 1 drop into both eyes 2 (two) times daily.    [provider]  simvastatin (ZOCOR) 20 MG tablet Take 20 mg by  mouth at bedtime.  08/02/14   [provider]  vitamin B-12 (CYANOCOBALAMIN) 500 MCG tablet Take 500 mcg by mouth daily with breakfast.    [provider]    Family History Family History  Problem Relation Age of Onset  . Diabetes Other     Social History Social History   Tobacco Use  . Smoking status: Never Smoker  . Smokeless tobacco: Never Used  Substance Use Topics  . Alcohol use: No    Alcohol/week: 0.0 oz  . Drug use: No     Allergies   Patient has no known allergies.   Review of Systems Review of Systems  Gastrointestinal: Positive for diarrhea.  Musculoskeletal: Positive for neck pain.  Neurological: Positive for light-headedness (resolved).  All other systems reviewed and are negative.    Physical Exam Updated Vital Signs BP (!) 152/52   Pulse 65   Temp 97.7 F (36.5 C) (Oral)   Resp 15   Ht 5\' 3"  (1.6 m)   Wt 66.7 kg (147 lb)   SpO2 100%   BMI 26.04 kg/m   Physical Exam  Constitutional: She is oriented to person, place, and time. She appears well-developed and well-nourished. No distress.  NAD. Nontoxic.  HENT:  Head: Normocephalic and atraumatic.  Right Ear: External ear normal.  Left Ear: External ear normal.  Nose: Nose normal.  Dry mucous membranes  Eyes: Conjunctivae are normal. No scleral icterus.  Neck: Normal range of motion. Neck supple.  No midline C-spine tenderness. No paraspinal cervical muscle tenderness. Painless passive range of motion of the neck.  Cardiovascular: Normal rate, regular rhythm and normal heart sounds.  No murmur heard. RRR. HR 62. 2+ DP and radial pulses bilaterally. No lower extremity edema.  Calves soft and nontender.  Pulmonary/Chest: Effort normal and breath sounds normal. She has no wheezes.  Abdominal: Soft. There is no tenderness.  Abdomen nontender, soft. No guarding, rebound, rigidity. No suprapubic or CVA tenderness.  Genitourinary:  Genitourinary Comments: DRE deferred. Patient had  bright bloody watery stool in ED.   Musculoskeletal: Normal range of motion. She exhibits no deformity.  Neurological: She is alert and oriented to person, place, and time.  No dysarthria. No nystagmus. Strength 5/5 with hand grip and ankle flexion/extension.   Sensation to light touch intact in hands and feet. CN I and VIII not tested. CN II-XII intact bilaterally.  Gait not assessed, pending orthostatic VS.  Skin: Skin is warm and dry. Capillary refill takes less than 2 seconds.  Psychiatric: She has a normal mood and affect. Her behavior is normal. Judgment and thought content normal.  Nursing note and vitals reviewed.    ED Treatments / Results  Labs (all labs ordered are listed, but only abnormal results are displayed) Labs Reviewed  BASIC METABOLIC PANEL - Abnormal; Notable for the following components:      Result Value  Sodium 132 (*)    Potassium 3.4 (*)    Chloride 95 (*)    Glucose, Bld 119 (*)    Creatinine, Ser 1.19 (*)    GFR calc non Af Amer 40 (*)    GFR calc Af Amer 46 (*)    All other components within normal limits  CBC - Abnormal; Notable for the following components:   RBC 3.52 (*)    Hemoglobin 10.5 (*)    HCT 33.0 (*)    Platelets 142 (*)    All other components within normal limits  URINALYSIS, ROUTINE W REFLEX MICROSCOPIC - Abnormal; Notable for the following components:   Color, Urine STRAW (*)    Hgb urine dipstick SMALL (*)    Bacteria, UA RARE (*)    Squamous Epithelial / LPF 0-5 (*)    All other components within normal limits  POC OCCULT BLOOD, ED - Abnormal; Notable for the following components:   Fecal Occult Bld POSITIVE (*)    All other components within normal limits  C DIFFICILE QUICK SCREEN W PCR REFLEX  CBG MONITORING, ED  I-STAT TROPONIN, ED  TYPE AND SCREEN  ABO/RH    EKG  EKG Interpretation  Date/Time:  Thursday January 30 2017 00:13:29 EST Ventricular Rate:  70 PR Interval:    QRS Duration: 170 QT  Interval:  473 QTC Calculation: 511 R Axis:   -80 Text Interpretation:  A-V dual-paced rhythm with some inhibition No further analysis attempted due to paced rhythm No significant change since last tracing Confirmed by Rochele RaringWard, Kristen [phone]([id number]) on 01/30/2017 2:11:17 AM       Radiology Ct Abdomen Pelvis W Contrast  Result Date: 01/30/2017 CLINICAL DATA:  83 y/o  F; diarrhea and bloody stool. EXAM: CT ABDOMEN AND PELVIS WITH CONTRAST TECHNIQUE: Multidetector CT imaging of the abdomen and pelvis was performed using the standard protocol following bolus administration of intravenous contrast. CONTRAST:  100mL ISOVUE-300 IOPAMIDOL (ISOVUE-300) INJECTION 61% COMPARISON:  11/24/2015 chest radiograph FINDINGS: Lower chest: Coarse reticulation and ground-glass opacities at the lung bases with patchy areas of sparing probably representing pulmonary fibrosis. Moderate cardiomegaly. Hepatobiliary: No focal liver abnormality is seen. No gallstones, gallbladder wall thickening, or biliary dilatation. Pancreas: Unremarkable. No pancreatic ductal dilatation or surrounding inflammatory changes. Spleen: Normal in size without focal abnormality. Adrenals/Urinary Tract: Normal adrenal glands. Right kidney upper pole 19 mm cyst. No other focal kidney lesion identified. No hydronephrosis. Mild bladder distention. Stomach/Bowel: Stomach is within normal limits. Appendix not identified. No pericecal inflammation. Moderate wall thickening of long segment of colon at the splenic flexure. Normal appearance of small bowel. Vascular/Lymphatic: Aortic atherosclerosis. No enlarged abdominal or pelvic lymph nodes. Reproductive: Status post hysterectomy. 2.2 cm oval cystic lesion in right adnexa, poorly defined margin, probably benign (series 6, image 74). Other: Small right inguinal hernia containing fat. Musculoskeletal: T12 and L1 superior endplate fractures with mild anterior loss of height, grossly stable from prior chest radiograph.  Moderate lumbar spine degenerative changes with loss of disc space height greatest at the L3-4 and L4-5 levels. IMPRESSION: 1. Moderate wall thickening of long segment of colon at splenic flexure compatible with acute colitis. No evidence for perforation or abscess. 2. Coarse reticulation and ground-glass opacities in lung bases, likely pulmonary fibrosis. Consider nonemergent interstitial lung disease protocol CT of the chest. 3. Moderate cardiomegaly. 4. Severe aortic atherosclerosis. 5. 2.2 cm right adnexal cystic lesion, probably benign, nonemergent pelvic ultrasound is recommended. This recommendation follows ACR consensus guidelines: White Paper of the  ACR Incidental Findings Committee II on Adnexal Findings. J Am Coll Radiol 2013:10:675-681. 6. T12 and L1 anterior compression deformities, grossly stable from prior chest radiograph given differences in technique. Electronically Signed   By: Mitzi Hansen M.D.   On: 01/30/2017 03:58    Procedures Procedures (including critical care time)  Medications Ordered in ED Medications  piperacillin-tazobactam (ZOSYN) IVPB 2.25 g (not administered)  sodium chloride 0.9 % bolus 1,000 mL (0 mLs Intravenous Stopped 01/30/17 0230)  iopamidol (ISOVUE-300) 61 % injection (100 mLs  Contrast Given 01/30/17 0317)     Initial Impression / Assessment and Plan / ED Course  I have reviewed the triage vital signs and the nursing notes.  Pertinent labs & imaging results that were available during my care of the patient were reviewed by me and considered in my medical decision making (see chart for details).  Clinical Course as of Jan 31 428  Wed Jan 29, 2017  2326 Chloride: (!) 95 [CG]  2326 Creatinine: (!) 1.19 [CG]  2326 GFR, Est African American: (!) 46 [CG]  2326 Hemoglobin: (!) 10.5 [CG]  Thu Jan 30, 2017  0102 [facility] tech called, no abnormalities noted on PCM.  [CG]  0118 Fecal Occult Blood, POC: (!) POSITIVE [CG]  0424 IMPRESSION: 1. Moderate  wall thickening of long segment of colon at splenic flexure compatible with acute colitis. No evidence for perforation or abscess. 2. Coarse reticulation and ground-glass opacities in lung bases, likely pulmonary fibrosis. Consider nonemergent interstitial lung disease protocol CT of the chest. 3. Moderate cardiomegaly. 4. Severe aortic atherosclerosis. 5. 2.2 cm right adnexal cystic lesion, probably benign, nonemergent pelvic ultrasound is recommended. This recommendation follows ACR consensus guidelines: White Paper of the ACR Incidental Findings Committee II on Adnexal Findings. J Am Coll Radiol 2013:10:675-681. 6. T12 and L1 anterior compression deformities, grossly stable from prior chest radiograph given differences in technique. CT Abdomen Pelvis W Contrast [CG]    Clinical Course User Index [CG] Liberty Handy, PA-C   83 year old female with medical history significant for second-degree AV block status post pacemaker presents for evaluation of bloody, watery diarrhea 24 hours. Had presyncopal episode today with light-headedness that resulted in fall, bumping the left side of her head. No LOC. No anticoagulants.  Exam is reassuring. She has no abdominal tenderness. Looks slightly dry. She had one bloody, watery stool in the ED. She was orthostatic. No scalp hematoma, tenderness. No neuro deficits. No midline c-spine tenderness. Cervical spine cleared with Nexxus criteria. Do not think CT head is necessary today.   Lab work remarkable for hemoglobin 10.5, appears to be at baseline. Positive Hemoccult. Chloride 95. Potassium 3.4. Slight bump in creatinine at 1.19. CT A/P shows shows moderate wall thickening c/w acute colitis w/o perforation or abscess. C diff negative. Pending stool studies. Will request admission for colitis, IV abx, IVF. Pt shared with supervising physician. Will start abx.   Final Clinical Impressions(s) / ED Diagnoses   Final diagnoses:  Colitis without  complication    ED Discharge Orders    None       Liberty Handy, PA-C 01/30/17 1610

## 2017-01-30 ENCOUNTER — Emergency Department (HOSPITAL_COMMUNITY): Payer: Medicare Other

## 2017-01-30 ENCOUNTER — Other Ambulatory Visit: Payer: Self-pay

## 2017-01-30 ENCOUNTER — Encounter (HOSPITAL_COMMUNITY): Payer: Self-pay | Admitting: Family Medicine

## 2017-01-30 DIAGNOSIS — Z791 Long term (current) use of non-steroidal anti-inflammatories (NSAID): Secondary | ICD-10-CM | POA: Diagnosis not present

## 2017-01-30 DIAGNOSIS — R55 Syncope and collapse: Secondary | ICD-10-CM | POA: Diagnosis not present

## 2017-01-30 DIAGNOSIS — E871 Hypo-osmolality and hyponatremia: Secondary | ICD-10-CM | POA: Diagnosis present

## 2017-01-30 DIAGNOSIS — A049 Bacterial intestinal infection, unspecified: Secondary | ICD-10-CM

## 2017-01-30 DIAGNOSIS — N289 Disorder of kidney and ureter, unspecified: Secondary | ICD-10-CM

## 2017-01-30 DIAGNOSIS — D649 Anemia, unspecified: Secondary | ICD-10-CM

## 2017-01-30 DIAGNOSIS — Z96651 Presence of right artificial knee joint: Secondary | ICD-10-CM | POA: Diagnosis not present

## 2017-01-30 DIAGNOSIS — I442 Atrioventricular block, complete: Secondary | ICD-10-CM

## 2017-01-30 DIAGNOSIS — Z79899 Other long term (current) drug therapy: Secondary | ICD-10-CM | POA: Diagnosis not present

## 2017-01-30 DIAGNOSIS — I951 Orthostatic hypotension: Secondary | ICD-10-CM | POA: Diagnosis present

## 2017-01-30 DIAGNOSIS — K529 Noninfective gastroenteritis and colitis, unspecified: Secondary | ICD-10-CM | POA: Diagnosis not present

## 2017-01-30 DIAGNOSIS — E785 Hyperlipidemia, unspecified: Secondary | ICD-10-CM | POA: Diagnosis not present

## 2017-01-30 DIAGNOSIS — I10 Essential (primary) hypertension: Secondary | ICD-10-CM | POA: Diagnosis not present

## 2017-01-30 DIAGNOSIS — Z23 Encounter for immunization: Secondary | ICD-10-CM | POA: Diagnosis not present

## 2017-01-30 DIAGNOSIS — E7800 Pure hypercholesterolemia, unspecified: Secondary | ICD-10-CM | POA: Diagnosis not present

## 2017-01-30 DIAGNOSIS — I739 Peripheral vascular disease, unspecified: Secondary | ICD-10-CM | POA: Diagnosis not present

## 2017-01-30 DIAGNOSIS — Z950 Presence of cardiac pacemaker: Secondary | ICD-10-CM | POA: Diagnosis not present

## 2017-01-30 DIAGNOSIS — M1990 Unspecified osteoarthritis, unspecified site: Secondary | ICD-10-CM | POA: Diagnosis not present

## 2017-01-30 DIAGNOSIS — K6389 Other specified diseases of intestine: Secondary | ICD-10-CM | POA: Diagnosis not present

## 2017-01-30 DIAGNOSIS — N949 Unspecified condition associated with female genital organs and menstrual cycle: Secondary | ICD-10-CM | POA: Diagnosis not present

## 2017-01-30 DIAGNOSIS — E876 Hypokalemia: Secondary | ICD-10-CM | POA: Diagnosis not present

## 2017-01-30 DIAGNOSIS — Z90710 Acquired absence of both cervix and uterus: Secondary | ICD-10-CM | POA: Diagnosis not present

## 2017-01-30 DIAGNOSIS — Z7982 Long term (current) use of aspirin: Secondary | ICD-10-CM | POA: Diagnosis not present

## 2017-01-30 LAB — URINALYSIS, ROUTINE W REFLEX MICROSCOPIC
BILIRUBIN URINE: NEGATIVE
Glucose, UA: NEGATIVE mg/dL
Ketones, ur: NEGATIVE mg/dL
LEUKOCYTES UA: NEGATIVE
Nitrite: NEGATIVE
PH: 6 (ref 5.0–8.0)
Protein, ur: NEGATIVE mg/dL
SPECIFIC GRAVITY, URINE: 1.005 (ref 1.005–1.030)
WBC, UA: NONE SEEN WBC/hpf (ref 0–5)

## 2017-01-30 LAB — TYPE AND SCREEN
ABO/RH(D): O POS
Antibody Screen: NEGATIVE

## 2017-01-30 LAB — C DIFFICILE QUICK SCREEN W PCR REFLEX
C DIFFICILE (CDIFF) INTERP: NOT DETECTED
C DIFFICILE (CDIFF) TOXIN: NEGATIVE
C Diff antigen: NEGATIVE

## 2017-01-30 LAB — COMPREHENSIVE METABOLIC PANEL
ALK PHOS: 59 U/L (ref 38–126)
ALT: 10 U/L — ABNORMAL LOW (ref 14–54)
ANION GAP: 6 (ref 5–15)
AST: 20 U/L (ref 15–41)
Albumin: 2.9 g/dL — ABNORMAL LOW (ref 3.5–5.0)
BILIRUBIN TOTAL: 0.4 mg/dL (ref 0.3–1.2)
BUN: 13 mg/dL (ref 6–20)
CALCIUM: 8.4 mg/dL — AB (ref 8.9–10.3)
CO2: 27 mmol/L (ref 22–32)
Chloride: 100 mmol/L — ABNORMAL LOW (ref 101–111)
Creatinine, Ser: 1.06 mg/dL — ABNORMAL HIGH (ref 0.44–1.00)
GFR, EST AFRICAN AMERICAN: 53 mL/min — AB (ref >60)
GFR, EST NON AFRICAN AMERICAN: 45 mL/min — AB (ref >60)
GLUCOSE: 97 mg/dL (ref 65–99)
POTASSIUM: 3.6 mmol/L (ref 3.5–5.1)
Sodium: 133 mmol/L — ABNORMAL LOW (ref 135–145)
TOTAL PROTEIN: 6.3 g/dL — AB (ref 6.5–8.1)

## 2017-01-30 LAB — CBC WITH DIFFERENTIAL/PLATELET
BASOS ABS: 0 10*3/uL (ref 0.0–0.1)
BASOS PCT: 0 %
Eosinophils Absolute: 0 10*3/uL (ref 0.0–0.7)
Eosinophils Relative: 0 %
HEMATOCRIT: 27.1 % — AB (ref 36.0–46.0)
Hemoglobin: 8.6 g/dL — ABNORMAL LOW (ref 12.0–15.0)
LYMPHS PCT: 19 %
Lymphs Abs: 0.7 10*3/uL (ref 0.7–4.0)
MCH: 30 pg (ref 26.0–34.0)
MCHC: 31.7 g/dL (ref 30.0–36.0)
MCV: 94.4 fL (ref 78.0–100.0)
MONO ABS: 0.5 10*3/uL (ref 0.1–1.0)
Monocytes Relative: 13 %
NEUTROS ABS: 2.5 10*3/uL (ref 1.7–7.7)
NEUTROS PCT: 68 %
Platelets: 125 10*3/uL — ABNORMAL LOW (ref 150–400)
RBC: 2.87 MIL/uL — AB (ref 3.87–5.11)
RDW: 12.5 % (ref 11.5–15.5)
WBC: 3.7 10*3/uL — AB (ref 4.0–10.5)

## 2017-01-30 LAB — ABO/RH: ABO/RH(D): O POS

## 2017-01-30 LAB — I-STAT TROPONIN, ED: Troponin i, poc: 0.02 ng/mL (ref 0.00–0.08)

## 2017-01-30 LAB — POC OCCULT BLOOD, ED: FECAL OCCULT BLD: POSITIVE — AB

## 2017-01-30 MED ORDER — VITAMIN B-12 1000 MCG PO TABS
500.0000 ug | ORAL_TABLET | Freq: Every day | ORAL | Status: DC
  Administered 2017-01-30 – 2017-01-31 (×2): 500 ug via ORAL
  Filled 2017-01-30 (×2): qty 1

## 2017-01-30 MED ORDER — ONDANSETRON HCL 4 MG/2ML IJ SOLN: 4.0000 mg | Freq: Four times a day (QID) | INTRAMUSCULAR | Status: DC | PRN

## 2017-01-30 MED ORDER — PANTOPRAZOLE SODIUM 40 MG PO TBEC
40.0000 mg | DELAYED_RELEASE_TABLET | Freq: Every day | ORAL | Status: DC
  Administered 2017-01-30 – 2017-01-31 (×2): 40 mg via ORAL
  Filled 2017-01-30 (×2): qty 1

## 2017-01-30 MED ORDER — SODIUM CHLORIDE 0.9 % IV SOLN
INTRAVENOUS | Status: DC
  Administered 2017-01-30 – 2017-01-31 (×2): via INTRAVENOUS

## 2017-01-30 MED ORDER — POTASSIUM CHLORIDE IN NACL 20-0.9 MEQ/L-% IV SOLN
INTRAVENOUS | Status: DC
  Administered 2017-01-30: 09:00:00 via INTRAVENOUS
  Filled 2017-01-30: qty 1000

## 2017-01-30 MED ORDER — NIFEDIPINE ER 30 MG PO TB24
30.0000 mg | ORAL_TABLET | Freq: Every day | ORAL | Status: DC
Start: 2017-01-30 — End: 2017-01-30

## 2017-01-30 MED ORDER — ONDANSETRON HCL 4 MG PO TABS: 4.0000 mg | ORAL_TABLET | Freq: Four times a day (QID) | ORAL | Status: DC | PRN

## 2017-01-30 MED ORDER — CALCIUM CARBONATE-VITAMIN D 500-200 MG-UNIT PO TABS
1.0000 | ORAL_TABLET | Freq: Two times a day (BID) | ORAL | Status: DC
  Administered 2017-01-30 – 2017-01-31 (×3): 1 via ORAL
  Filled 2017-01-30 (×3): qty 1

## 2017-01-30 MED ORDER — POLYVINYL ALCOHOL 1.4 % OP SOLN
1.0000 [drp] | Freq: Two times a day (BID) | OPHTHALMIC | Status: DC
  Administered 2017-01-30 – 2017-01-31 (×3): 1 [drp] via OPHTHALMIC
  Filled 2017-01-30: qty 15

## 2017-01-30 MED ORDER — SODIUM CHLORIDE 0.9% FLUSH
3.0000 mL | Freq: Two times a day (BID) | INTRAVENOUS | Status: DC
  Administered 2017-01-30 – 2017-01-31 (×2): 3 mL via INTRAVENOUS

## 2017-01-30 MED ORDER — ACETAMINOPHEN 650 MG RE SUPP: 650.0000 mg | Freq: Four times a day (QID) | RECTAL | Status: DC | PRN

## 2017-01-30 MED ORDER — INFLUENZA VAC SPLIT HIGH-DOSE 0.5 ML IM SUSY
0.5000 mL | PREFILLED_SYRINGE | INTRAMUSCULAR | Status: AC
  Administered 2017-01-31: 0.5 mL via INTRAMUSCULAR
  Filled 2017-01-30: qty 0.5

## 2017-01-30 MED ORDER — OXYBUTYNIN CHLORIDE 5 MG PO TABS
5.0000 mg | ORAL_TABLET | Freq: Every day | ORAL | Status: DC
  Administered 2017-01-30: 5 mg via ORAL
  Filled 2017-01-30: qty 1

## 2017-01-30 MED ORDER — PIPERACILLIN-TAZOBACTAM 3.375 G IVPB
3.3750 g | Freq: Three times a day (TID) | INTRAVENOUS | Status: DC
  Administered 2017-01-30 – 2017-01-31 (×4): 3.375 g via INTRAVENOUS
  Filled 2017-01-30 (×6): qty 50

## 2017-01-30 MED ORDER — HYDROCODONE-ACETAMINOPHEN 5-325 MG PO TABS: 1.0000 | ORAL_TABLET | ORAL | Status: DC | PRN

## 2017-01-30 MED ORDER — PIPERACILLIN-TAZOBACTAM IN DEX 2-0.25 GM/50ML IV SOLN: 2.2500 g | Freq: Once | INTRAVENOUS | Status: DC

## 2017-01-30 MED ORDER — ACETAMINOPHEN 325 MG PO TABS: 650.0000 mg | ORAL_TABLET | Freq: Four times a day (QID) | ORAL | Status: DC | PRN

## 2017-01-30 MED ORDER — IOPAMIDOL (ISOVUE-300) INJECTION 61%
INTRAVENOUS | Status: AC
  Administered 2017-01-30: 100 mL
  Filled 2017-01-30: qty 100

## 2017-01-30 MED ORDER — SIMVASTATIN 20 MG PO TABS
20.0000 mg | ORAL_TABLET | Freq: Every day | ORAL | Status: DC
  Administered 2017-01-30: 20 mg via ORAL
  Filled 2017-01-30: qty 1

## 2017-01-30 NOTE — Progress Notes (Signed)
New Admission Note:  Arrival Method: Via stretcher from ED  Mental Orientation: Alert & Oriented Telemetry: CCMD verified Skin: Refer to flowsheet IV: Left AC Pain: 0/10 Safety Measures: Safety Fall Prevention Plan discussed with patient. 5 [place] Orientation: Patient has been orientated to the room, unit and the staff.  Will continue to monitor the patient. Call light has been placed within reach and bed alarm has been activated.   Aram CandelaJequetta Yasenia Reedy, RN  Phone Number: [phone]

## 2017-01-30 NOTE — ED Notes (Signed)
Orthostatic vitals completed, pt did not complain of pain or dizziness upon sitting or standing.

## 2017-01-30 NOTE — H&P (Addendum)
History and Physical    [name]:[id number] DOB: October 27, 1926 DOA: 01/29/2017  PCP: Jarome MatinPaterson, Daniel, MD   Patient coming from: Home  Chief Complaint: Diarrhea, near-syncope  HPI: Robin SilversBernell G Brizuela is a 83 y.o. female with medical history significant for hypertension, hyperlipidemia, and heart block with pacemaker who presents to the emergency department for evaluation of diarrhea and near syncope.  Patient reports that she been in her usual state of health and noon on on 01/29/2017 when she had some watery diarrhea.  She went on to have several more episodes and then felt as though she was going to pass out, went to the ground without losing consciousness, sitting back onto her bottom on the floor.  There was no chest pain or palpitations associated with this.  She denies any fevers or chills, denies abdominal pain, nausea, or vomiting, denies recent travel, and denies sick contacts.  She had a normal colonoscopy in 2004.  ED Course: Upon arrival to the ED, patient is found to be afebrile, saturating well on room air, and with blood pressure 106/67.  She had a 30 bpm increase in heart rate from lying to standing.  EKG features a paced rhythm.  Chemistry panel is notable for a sodium of 132, potassium 3.4, and creatinine of 1.19, similar to priors.  CBC is notable for a chronic normocytic anemia with hemoglobin of 10.5, up from values in the 9 range with most recent CBC in 2017.  There is a slight thrombocytopenia with platelets 142,000.  Troponin is normal and urinalysis is on.  Patient went on to have bloody diarrhea in the ED.  C. difficile PCR is negative.  CT of the abdomen and pelvis is most notable for a moderate wall thickening of long segment of colon at the splenic flexure compatible with acute colitis without abscess or perforation.  CT is also notable for probable fibrosis at the lung bases and an incidental right adnexal cyst, probably benign.  GI pathogen panel was ordered from  the emergency department, 1 L of normal saline was given, during Zosyn.  She remained hemodynamically stable in the ED, has not been in any apparent respiratory distress, and will be admitted to the telemetry unit for ongoing evaluation and management of bloody diarrhea with near syncope.  Review of Systems:  All other systems reviewed and apart from HPI, are negative.  Past Medical History:  Diagnosis Date  . Arthritis   . Dyslipidemia   . High cholesterol   . Hypertension   . S/P hysterectomy   . Second degree AV block    with syncope,  s/p PPM by Dr Johney FrameAllred  . Syncope     Past Surgical History:  Procedure Laterality Date  . ABDOMINAL HYSTERECTOMY    . EP IMPLANTABLE DEVICE N/A 11/23/2015   SJM [employer] DR pacemaker implanted by Dr Johney FrameAllred for second degree AV block with presyncope  . implantation of implantable loop recorder    . LOOP RECORDER EXPLANT N/A 03/13/2012   Procedure: LOOP RECORDER EXPLANT;  Surgeon: Marinus MawGregg W Taylor, MD;  Location[facility] C[facility];  Service: Cardiovascular;  Laterality: N/A;  . REPLACEMENT TOTAL KNEE Left      reports that  has never smoked. she has never used smokeless tobacco. She reports that she does not drink alcohol or use drugs.  No Known Allergies  Family History  Problem Relation Age of Onset  . Diabetes Other      Prior to Admission medications   Medication Sig  Start Date End Date Taking? Authorizing Provider  acetaminophen (TYLENOL) 650 MG CR tablet Take 650 mg by mouth 2 (two) times daily.    [provider]  aspirin EC 81 MG tablet Take 81 mg by mouth daily with breakfast.     [provider]  calcium-vitamin D (OSCAL WITH D) 500-200 MG-UNIT per tablet Take 1 tablet by mouth 2 (two) times daily.    [provider]  docusate sodium (COLACE) 100 MG capsule Take 100 mg by mouth daily as needed for mild constipation.    [provider]  ergocalciferol (VITAMIN D2) 50000 UNITS capsule Take 50,000 Units by  mouth every 14 (fourteen) days.    [provider]  losartan-hydrochlorothiazide (HYZAAR) 50-12.5 MG tablet Take 1 tablet by mouth daily with breakfast.  07/21/15   [provider]  metoCLOPramide (REGLAN) 5 MG tablet Take 1 tablet by mouth daily with supper. 07/29/15   [provider]  NIFEdipine (PROCARDIA-XL/ADALAT-CC/NIFEDICAL-XL) 30 MG 24 hr tablet Take 1 tablet (30 mg total) by mouth daily. 07/25/16   Marinus Mawaylor, Gregg W, MD  oxybutynin (DITROPAN) 5 MG tablet Take 5 mg by mouth at bedtime.  08/02/14   [provider]  pantoprazole (PROTONIX) 40 MG tablet Take 40 mg by mouth daily with breakfast.    [provider]  Polyethyl Glycol-Propyl Glycol (SYSTANE) 0.4-0.3 % SOLN Place 1 drop into both eyes 2 (two) times daily.    [provider]  simvastatin (ZOCOR) 20 MG tablet Take 20 mg by mouth at bedtime.  08/02/14   [provider]  vitamin B-12 (CYANOCOBALAMIN) 500 MCG tablet Take 500 mcg by mouth daily with breakfast.    [provider]    Physical Exam: Vitals:   01/30/17 0230 01/30/17 0315 01/30/17 0400 01/30/17 0415  BP: 128/62 (!) 145/52 (!) 142/52 (!) 152/52  Pulse: 64 70 60 65  Resp:   17 15  Temp:      TempSrc:      SpO2: 100% 100% 100% 100%  Weight:      Height:          Constitutional: NAD, calm, resting  Eyes: PERTLA, lids and conjunctivae normal ENMT: Mucous membranes are moist. Posterior pharynx clear of any exudate or lesions.   Neck: normal, supple, no masses, no thyromegaly Respiratory: No wheezing, no crackles. Normal respiratory effort.   Cardiovascular: S1 & S2 heard, regular rate and rhythm. No significant JVD. Abdomen: No distension, no tenderness, no masses palpated. Bowel sounds active.  Musculoskeletal: no clubbing / cyanosis. No joint deformity upper and lower extremities.   Skin: no significant rashes, lesions, ulcers. Warm, dry, well-perfused. Neurologic: CN 2-12 grossly intact. Sensation  intact. Strength 5/5 in all 4 limbs.  Psychiatric: Alert and oriented x 3. Pleasant, cooperative.     Labs on Admission: I have personally reviewed following labs and imaging studies  CBC: Recent Labs  Lab 01/29/17 2236  WBC 5.8  HGB 10.5*  HCT 33.0*  MCV 93.8  PLT 142*   Basic Metabolic Panel: Recent Labs  Lab 01/29/17 2236  NA 132*  K 3.4*  CL 95*  CO2 26  GLUCOSE 119*  BUN 16  CREATININE 1.19*  CALCIUM 9.1   GFR: Estimated Creatinine Clearance: 30 mL/min (A) (by C-G formula based on SCr of 1.19 mg/dL (H)). Liver Function Tests: No results for input(s): AST, ALT, ALKPHOS, BILITOT, PROT, ALBUMIN in the last 168 hours. No results for input(s): LIPASE, AMYLASE in the last 168 hours. No  results for input(s): AMMONIA in the last 168 hours. Coagulation Profile: No results for input(s): INR, PROTIME in the last 168 hours. Cardiac Enzymes: No results for input(s): CKTOTAL, CKMB, CKMBINDEX, TROPONINI in the last 168 hours. BNP (last 3 results) No results for input(s): PROBNP in the last 8760 hours. HbA1C: No results for input(s): HGBA1C in the last 72 hours. CBG: Recent Labs  Lab 01/29/17 2251  GLUCAP 92   Lipid Profile: No results for input(s): CHOL, HDL, LDLCALC, TRIG, CHOLHDL, LDLDIRECT in the last 72 hours. Thyroid Function Tests: No results for input(s): TSH, T4TOTAL, FREET4, T3FREE, THYROIDAB in the last 72 hours. Anemia Panel: No results for input(s): VITAMINB12, FOLATE, FERRITIN, TIBC, IRON, RETICCTPCT in the last 72 hours. Urine analysis:    Component Value Date/Time   COLORURINE STRAW (A) 01/30/2017 0250   APPEARANCEUR CLEAR 01/30/2017 0250   LABSPEC 1.005 01/30/2017 0250   PHURINE 6.0 01/30/2017 0250   GLUCOSEU NEGATIVE 01/30/2017 0250   HGBUR SMALL (A) 01/30/2017 0250   BILIRUBINUR NEGATIVE 01/30/2017 0250   KETONESUR NEGATIVE 01/30/2017 0250   PROTEINUR NEGATIVE 01/30/2017 0250   UROBILINOGEN 0.2 08/07/2014 1310   NITRITE NEGATIVE 01/30/2017  0250   LEUKOCYTESUR NEGATIVE 01/30/2017 0250   Sepsis Labs: @LABRCNTIP (procalcitonin:4,lacticidven:4) ) Recent Results (from the past 240 hour(s))  C difficile quick scan w PCR reflex     Status: None   Collection Time: 01/29/17 11:28 PM  Result Value Ref Range Status   C Diff antigen NEGATIVE NEGATIVE Final   C Diff toxin NEGATIVE NEGATIVE Final   C Diff interpretation No C. difficile detected.  Final     Radiological Exams on Admission: Ct Abdomen Pelvis W Contrast  Result Date: 01/30/2017 CLINICAL DATA:  83 y/o  F; diarrhea and bloody stool. EXAM: CT ABDOMEN AND PELVIS WITH CONTRAST TECHNIQUE: Multidetector CT imaging of the abdomen and pelvis was performed using the standard protocol following bolus administration of intravenous contrast. CONTRAST:  ISOVUE-300 IOPAMIDOL (ISOVUE-300) INJECTION 61% COMPARISON:  11/24/2015 chest radiograph FINDINGS: Lower chest: Coarse reticulation and ground-glass opacities at the lung bases with patchy areas of sparing probably representing pulmonary fibrosis. Moderate cardiomegaly. Hepatobiliary: No focal liver abnormality is seen. No gallstones, gallbladder wall thickening, or biliary dilatation. Pancreas: Unremarkable. No pancreatic ductal dilatation or surrounding inflammatory changes. Spleen: Normal in size without focal abnormality. Adrenals/Urinary Tract: Normal adrenal glands. Right kidney upper pole 19 mm cyst. No other focal kidney lesion identified. No hydronephrosis. Mild bladder distention. Stomach/Bowel: Stomach is within normal limits. Appendix not identified. No pericecal inflammation. Moderate wall thickening of long segment of colon at the splenic flexure. Normal appearance of small bowel. Vascular/Lymphatic: Aortic atherosclerosis. No enlarged abdominal or pelvic lymph nodes. Reproductive: Status post hysterectomy. 2.2 cm oval cystic lesion in right adnexa, poorly defined margin, probably benign (series 6, image 74). Other: Small right  inguinal hernia containing fat. Musculoskeletal: T12 and L1 superior endplate fractures with mild anterior loss of height, grossly stable from prior chest radiograph. Moderate lumbar spine degenerative changes with loss of disc space height greatest at the L3-4 and L4-5 levels. IMPRESSION: 1. Moderate wall thickening of long segment of colon at splenic flexure compatible with acute colitis. No evidence for perforation or abscess. 2. Coarse reticulation and ground-glass opacities in lung bases, likely pulmonary fibrosis. Consider nonemergent interstitial lung disease protocol CT of the chest. 3. Moderate cardiomegaly. 4. Severe aortic atherosclerosis. 5. 2.2 cm right adnexal cystic lesion, probably benign, nonemergent pelvic ultrasound is recommended. This recommendation follows ACR consensus guidelines: White  Paper of the ACR Incidental Findings Committee II on Adnexal Findings. J Am Coll Radiol 2013:10:675-681. 6. T12 and L1 anterior compression deformities, grossly stable from prior chest radiograph given differences in technique. Electronically Signed   By: Mitzi Hansen M.D.   On: 01/30/2017 03:58    EKG: Independently reviewed. A-V dual paced rhythm.   Assessment/Plan  1. Bloody diarrhea; anemia    - Pt presents after several episodes of diarrhea, noted to have grossly bloody diarrhea in ED  - Hgb is 10.5 on admission and higher than priors in 9-range  - CT abd/pelvis suggests acute colitis around splenic flexure  - There is no abdominal tenderness; pt denies travel or sick-contacts  - C diff negative in ED, GI pathogen panel pending, type and screen done   - Continue enteric precautions, continue empiric abx, continue IVF hydration, repeat CBC in am    2. Near-syncope; orhtostasis  - Pt presents with near-syncope after several episodes of watery diarrhea at home  - There is a 30 bpm increase in HR from lying to standing   - She was given a liter of NS in ED and will be continued on  NS infusion  - Repeat orthostatic vitals after hydration   3. Hyponatremia  - Serum sodium is 132 on admission in setting of hypovolemia  - Given a liter NS bolus in ED and continued on NS infusion  - Repeat chem panel in am    4. Heart block  - Has pacer, following with cardiology annually with regular device checks   5. Hypokalemia  - Serum potassium 3.4, secondary to GI-losses  - KCl added to IVF, she is on telemetry, repeat chem panel in am    6. Mild renal insufficiency - SCr is 1.19 on admission, similar to priors  - She is continued on IVF hydration in setting of diarrhea with orthostasis and chem panel will be repeated in am   7. Hypertension  - BP at goal  - losartan-HCTZ and nifedipine held initially in setting of bloody diarrhea with near-syncope   8. Right adnexal cyst  - Incidental notation is made of 2.2 cm right adnexal cyst on CT abd/pelvis, likely benign  - Non-emergent pelvic US recommended     DVT prophylaxis: SCD's  Code Status: Full  Family Communication: Discussed with patient Disposition Plan: Admit to telemetry Consults called: None Admission status: Inpatient    Briscoe Deutscher, MD [facility] Pager [phone]  If 7PM-7AM, please contact night-coverage [email or web address] Password [email or web address]  01/30/2017, 4:46 AM

## 2017-01-30 NOTE — Progress Notes (Signed)
Pharmacy Antibiotic Note  [name] is a 83 y.o. female admitted on 01/29/2017 with intra-abdominal infection.  Pharmacy has been consulted for Zosyn dosing. WBC WNL. CrCl ~30. CT scan with colitis.   Plan: Zosyn 3.375G IV q8h to be infused over 4 hours Trend WBC, temp, renal function  F/U infectious work-up  Height: 5\' 3"  (160 cm) Weight: 147 lb (66.7 kg) IBW/kg (Calculated) : 52.4  Temp (24hrs), Avg:97.7 F (36.5 C), Min:97.7 F (36.5 C), Max:97.7 F (36.5 C)  Recent Labs  Lab 01/29/17 2236  WBC 5.8  CREATININE 1.19*    Estimated Creatinine Clearance: 30 mL/min (A) (by C-G formula based on SCr of 1.19 mg/dL (H)).    No Known Allergies   [name], [name] 01/30/2017 4:42 AM

## 2017-01-30 NOTE — ED Notes (Signed)
Patient transported to CT 

## 2017-01-30 NOTE — ED Notes (Signed)
Pt up to commode with this RN. Pt had 1 occurrence of runny, bloody stool. Debarah Crapelaudia, PA notified.

## 2017-01-30 NOTE — ED Provider Notes (Signed)
Medical screening examination/treatment/procedure(s) were conducted as a shared visit with non-physician practitioner(s) and myself.  I personally evaluated the patient during the encounter.   EKG Interpretation  Date/Time:  Thursday January 30 2017 00:13:29 EST Ventricular Rate:  70 PR Interval:    QRS Duration: 170 QT Interval:  473 QTC Calculation: 511 R Axis:   -80 Text Interpretation:  A-V dual-paced rhythm with some inhibition No further analysis attempted due to paced rhythm No significant change since last tracing Confirmed by Rochele RaringWard, Marena Witts [phone] on 01/30/2017 2:11:17 AM      Patient is an 83 year old female with history of hypertension, hyperlipidemia, second-degree AV block status post pacemaker placement who presents to the emergency department with near syncopal event.  States that she was walking to the bathroom and felt very lightheaded.  States she fell down to the ground but did not lose consciousness.  States she landed on her bottom.  States she lightly hit the side of her head against the wall.  No headache, posterior neck pain on exam.  Denies preceding chest pain or shortness of breath, numbness, tingling or focal weakness.  Has been having diarrhea today.  Now having significantly bloody stool in the emergency department but remains hemodynamically stable.  CT scan shows acute colitis.  Will give IV antibiotics and recommend admission.   Virgle Arth, Layla MawKristen N, DO 01/30/17 [phone]

## 2017-01-30 NOTE — Evaluation (Signed)
Physical Therapy Evaluation Patient Details Name: [name] MRN: [id number] DOB: 01/20/1927 Today's Date: 01/30/2017   History of Present Illness  Pt is an 83 y.o female admitted secondary to near syncope and bloody diarrhea. PMH includes HTN, and heart block s/p pacemaker insertion.   Clinical Impression  Pt admitted secondary to problem above with deficits below. PTA, pt reports she used a cane for ambulation. Upon eval, pt presenting with weakness and decreased balance. Required min guard to supervision for mobility this session. Reports daughter and grandson available intermittently. REcommending HHPT at d/c to increase independence and safety with functional mobility. Will continue to follow acutely to maximize functional mobility independence and safety.     Follow Up Recommendations Home health PT;Supervision for mobility/OOB    Equipment Recommendations  None recommended by PT    Recommendations for Other Services       Precautions / Restrictions Precautions Precautions: Fall Restrictions Weight Bearing Restrictions: No      Mobility  Bed Mobility Overal bed mobility: Needs Assistance Bed Mobility: Supine to Sit;Sit to Supine     Supine to sit: Supervision Sit to supine: Supervision   General bed mobility comments: Supervision for safety. Use of elevated HOB.   Transfers Overall transfer level: Needs assistance Equipment used: Rolling walker (2 wheeled) Transfers: Sit to/from Stand Sit to Stand: Min guard         General transfer comment: Min guard for safety. Verbal cues for safe hand placement.   Ambulation/Gait Ambulation/Gait assistance: Min guard Ambulation Distance (Feet): 25 Feet Assistive device: Rolling walker (2 wheeled) Gait Pattern/deviations: Step-through pattern;Decreased stride length Gait velocity: Decreased Gait velocity interpretation: Below normal speed for age/gender General Gait Details: Overall steady gait with use of RW.  Without AD pt reaching for surfaces to balance. Educated about use of DME at home to increase stability.   Stairs            Wheelchair Mobility    Modified Rankin (Stroke Patients Only)       Balance Overall balance assessment: Needs assistance Sitting-balance support: No upper extremity supported;Feet supported Sitting balance-Leahy Scale: Good     Standing balance support: Bilateral upper extremity supported;During functional activity Standing balance-Leahy Scale: Poor Standing balance comment: Reliant on RW for stability                              Pertinent Vitals/Pain Pain Assessment: No/denies pain    Home Living Family/patient expects to be discharged to:: Private residence Living Arrangements: Children;Other (Comment)(grandsons ) Available Help at Discharge: Family;Available PRN/intermittently Type of Home: House Home Access: Stairs to enter Entrance Stairs-Rails: Right Entrance Stairs-Number of Steps: 1 Home Layout: One level Home Equipment: Walker - 2 wheels;Cane - single point      Prior Function Level of Independence: Independent with assistive device(s)         Comments: Reports using cane at baseline      Hand Dominance   Dominant Hand: Right    Extremity/Trunk Assessment   Upper Extremity Assessment Upper Extremity Assessment: Overall WFL for tasks assessed    Lower Extremity Assessment Lower Extremity Assessment: Generalized weakness    Cervical / Trunk Assessment Cervical / Trunk Assessment: Kyphotic  Communication   Communication: No difficulties  Cognition Arousal/Alertness: Awake/alert Behavior During Therapy: WFL for tasks assessed/performed Overall Cognitive Status: Within Functional Limits for tasks assessed  General Comments      Exercises     Assessment/Plan    PT Assessment Patient needs continued PT services  PT Problem List Decreased  strength;Decreased balance;Decreased mobility;Decreased knowledge of use of DME       PT Treatment Interventions DME instruction;Gait training;Stair training;Functional mobility training;Therapeutic activities;Therapeutic exercise;Balance training;Neuromuscular re-education;Patient/family education    PT Goals (Current goals can be found in the Care Plan section)  Acute Rehab PT Goals Patient Stated Goal: to go home  PT Goal Formulation: With patient Time For Goal Achievement: 02/13/17 Potential to Achieve Goals: Good    Frequency Min 3X/week   Barriers to discharge        Co-evaluation               AM-PAC PT "6 Clicks" Daily Activity  Outcome Measure Difficulty turning over in bed (including adjusting bedclothes, sheets and blankets)?: A Little Difficulty moving from lying on back to sitting on the side of the bed? : A Little Difficulty sitting down on and standing up from a chair with arms (e.g., wheelchair, bedside commode, etc,.)?: Unable Help needed moving to and from a bed to chair (including a wheelchair)?: A Little Help needed walking in hospital room?: A Little Help needed climbing 3-5 steps with a railing? : A Lot 6 Click Score: 15    End of Session Equipment Utilized During Treatment: Gait belt Activity Tolerance: Patient tolerated treatment well Patient left: in bed;with call bell/phone within reach Nurse Communication: Mobility status PT Visit Diagnosis: Unsteadiness on feet (R26.81);Muscle weakness (generalized) (M62.81)    Time: [id number] PT Time Calculation (min) (ACUTE ONLY): 18 min   Charges:   PT Evaluation $PT Eval Low Complexity: 1 Low     PT G Codes:        Gladys DammeBrittany Erionna Strum, PT, DPT  Acute Rehabilitation Services  Pager: [phone]   Lehman PromBrittany S Nimrod Wendt 01/30/2017, 6:48 PM

## 2017-01-30 NOTE — Progress Notes (Addendum)
  PROGRESS NOTE  Patient admitted earlier this morning. See H&P. [name] is a 83 y.o. female with medical history significant for hypertension, hyperlipidemia, and heart block with pacemaker who presents to the emergency department for evaluation of diarrhea and near syncope. Patient reports that she been in her usual state of health and noon on on 01/29/2017 when she had some watery diarrhea. She went on to have several more episodes and then felt as though she was going to pass out, went to the ground without losing consciousness, sitting back onto her bottom on the floor. There was no chest pain or palpitations associated with this. She denies any fevers or chills, denies abdominal pain, nausea, or vomiting, denies recent travel, and denies sick contacts. She had a normal colonoscopy in 2004. Patient went on to have bloody diarrhea in the ED. C. difficile PCR is negative. CT of the abdomen and pelvis is most notable for a moderate wall thickening of long segment of colon at the splenic flexure compatible with acute colitis without abscess or perforation.  No diarrhea since admission, denies abdominal pain, nausea, vomiting. Wants diet advanced.  IVF, monitor symptoms, trend Hgb.    Noralee StainJennifer Azyriah Nevins, DO [facility] [email or web address] Password [email or web address] 01/30/2017, 1:48 PM

## 2017-01-31 DIAGNOSIS — D649 Anemia, unspecified: Secondary | ICD-10-CM | POA: Diagnosis not present

## 2017-01-31 DIAGNOSIS — I951 Orthostatic hypotension: Secondary | ICD-10-CM | POA: Diagnosis not present

## 2017-01-31 DIAGNOSIS — I442 Atrioventricular block, complete: Secondary | ICD-10-CM | POA: Diagnosis not present

## 2017-01-31 DIAGNOSIS — M1990 Unspecified osteoarthritis, unspecified site: Secondary | ICD-10-CM | POA: Diagnosis not present

## 2017-01-31 DIAGNOSIS — R55 Syncope and collapse: Secondary | ICD-10-CM | POA: Diagnosis not present

## 2017-01-31 DIAGNOSIS — E871 Hypo-osmolality and hyponatremia: Secondary | ICD-10-CM | POA: Diagnosis not present

## 2017-01-31 DIAGNOSIS — Z950 Presence of cardiac pacemaker: Secondary | ICD-10-CM | POA: Diagnosis not present

## 2017-01-31 DIAGNOSIS — N289 Disorder of kidney and ureter, unspecified: Secondary | ICD-10-CM | POA: Diagnosis not present

## 2017-01-31 DIAGNOSIS — K529 Noninfective gastroenteritis and colitis, unspecified: Principal | ICD-10-CM

## 2017-01-31 DIAGNOSIS — Z23 Encounter for immunization: Secondary | ICD-10-CM | POA: Diagnosis not present

## 2017-01-31 LAB — GASTROINTESTINAL PANEL BY PCR, STOOL (REPLACES STOOL CULTURE)

## 2017-01-31 LAB — BASIC METABOLIC PANEL
ANION GAP: 6 (ref 5–15)
BUN: 6 mg/dL (ref 6–20)
CALCIUM: 8.3 mg/dL — AB (ref 8.9–10.3)
CHLORIDE: 101 mmol/L (ref 101–111)
CO2: 26 mmol/L (ref 22–32)
Creatinine, Ser: 0.97 mg/dL (ref 0.44–1.00)
GFR calc Af Amer: 59 mL/min — ABNORMAL LOW (ref >60)
GFR calc non Af Amer: 51 mL/min — ABNORMAL LOW (ref >60)
GLUCOSE: 88 mg/dL (ref 65–99)
Potassium: 3.6 mmol/L (ref 3.5–5.1)
Sodium: 133 mmol/L — ABNORMAL LOW (ref 135–145)

## 2017-01-31 LAB — CBC
HEMATOCRIT: 27.1 % — AB (ref 36.0–46.0)
Hemoglobin: 8.5 g/dL — ABNORMAL LOW (ref 12.0–15.0)
MCH: 29.3 pg (ref 26.0–34.0)
MCHC: 31.4 g/dL (ref 30.0–36.0)
MCV: 93.4 fL (ref 78.0–100.0)
Platelets: 132 10*3/uL — ABNORMAL LOW (ref 150–400)
RBC: 2.9 MIL/uL — ABNORMAL LOW (ref 3.87–5.11)
RDW: 12.3 % (ref 11.5–15.5)
WBC: 3.6 10*3/uL — AB (ref 4.0–10.5)

## 2017-01-31 MED ORDER — CIPROFLOXACIN HCL 500 MG PO TABS: 500.0000 mg | ORAL_TABLET | Freq: Two times a day (BID) | ORAL | 0 refills | Status: AC

## 2017-01-31 NOTE — Progress Notes (Signed)
[name] to be D/C'd Home per MD order.  Discussed prescriptions and follow up appointments with the patient. Prescriptions given to patient, medication list explained in detail. Pt verbalized understanding.  Allergies as of 01/31/2017   No Known Allergies     Medication List    TAKE these medications   acetaminophen 650 MG CR tablet Commonly known as:  TYLENOL Take 650 mg by mouth 2 (two) times daily.   aspirin EC 81 MG tablet Take 81 mg by mouth daily with breakfast.   calcium-vitamin D 500-200 MG-UNIT tablet Commonly known as:  OSCAL WITH D Take 1 tablet by mouth 2 (two) times daily.   carbidopa-levodopa 10-100 MG tablet Commonly known as:  SINEMET IR Take 1 tablet by mouth 2 (two) times daily.   ciprofloxacin 500 MG tablet Commonly known as:  CIPRO Take 1 tablet (500 mg total) by mouth 2 (two) times daily for 3 days.   docusate sodium 100 MG capsule Commonly known as:  COLACE Take 100 mg by mouth daily as needed for mild constipation.   ergocalciferol 50000 units capsule Commonly known as:  VITAMIN D2 Take 50,000 Units by mouth every 14 (fourteen) days.   losartan-hydrochlorothiazide 50-12.5 MG tablet Commonly known as:  HYZAAR Take 1 tablet by mouth daily with breakfast.   metoCLOPramide 5 MG tablet Commonly known as:  REGLAN Take 1 tablet by mouth daily with supper.   NIFEdipine 30 MG 24 hr tablet Commonly known as:  PROCARDIA-XL/ADALAT-CC/NIFEDICAL-XL Take 1 tablet (30 mg total) by mouth daily.   oxybutynin 5 MG tablet Commonly known as:  DITROPAN Take 5 mg by mouth at bedtime.   pantoprazole 40 MG tablet Commonly known as:  PROTONIX Take 40 mg by mouth daily with breakfast.   simvastatin 20 MG tablet Commonly known as:  ZOCOR Take 20 mg by mouth at bedtime.   SYSTANE 0.4-0.3 % Soln Generic drug:  Polyethyl Glycol-Propyl Glycol Place 1 drop into both eyes 2 (two) times daily.   vitamin B-12 500 MCG tablet Commonly known as:   CYANOCOBALAMIN Take 500 mcg by mouth daily with breakfast.       Vitals:   01/31/17 0524 01/31/17 0945  BP: (!) 137/51 (!) 155/54  Pulse: 60 70  Resp: 18 18  Temp: 98.6 F (37 C) 98.2 F (36.8 C)  SpO2: 100% 100%    Skin clean, dry and intact without evidence of skin break down, no evidence of skin tears noted. IV catheter discontinued intact. Site without signs and symptoms of complications. Dressing and pressure applied. Pt denies pain at this time. No complaints noted.  An After Visit Summary was printed and given to the patient. Patient escorted via WC, and D/C home via private auto.  Fara BorosSara Chandler Swiderski BSN, RN [facility]

## 2017-01-31 NOTE — Progress Notes (Signed)
Physical Therapy Treatment Patient Details Name: [name] MRN: [id number] DOB: 06/28/1926 Today's Date: 01/31/2017    History of Present Illness Pt is an 83 y.o female admitted secondary to near syncope and bloody diarrhea. PMH includes HTN, and heart block s/p pacemaker insertion.     PT Comments    Pt making steady progress and tolerated ambulating a further distance this session. Pt would continue to benefit from skilled physical therapy services at this time while admitted and after d/c to address the below listed limitations in order to improve overall safety and independence with functional mobility.    Follow Up Recommendations  Home health PT;Supervision for mobility/OOB     Equipment Recommendations  None recommended by PT    Recommendations for Other Services       Precautions / Restrictions Precautions Precautions: Fall Restrictions Weight Bearing Restrictions: No    Mobility  Bed Mobility               General bed mobility comments: sitting EOB upon arrival  Transfers Overall transfer level: Needs assistance Equipment used: Rolling walker (2 wheeled) Transfers: Sit to/from Stand Sit to Stand: Min guard         General transfer comment: min guard for safety  Ambulation/Gait Ambulation/Gait assistance: Min guard Ambulation Distance (Feet): 50 Feet Assistive device: Rolling walker (2 wheeled) Gait Pattern/deviations: Step-through pattern;Decreased stride length Gait velocity: Decreased Gait velocity interpretation: Below normal speed for age/gender General Gait Details: steady, consistent gait with RW; no instability or LOB, min guard for safety   Stairs            Wheelchair Mobility    Modified Rankin (Stroke Patients Only)       Balance Overall balance assessment: Needs assistance Sitting-balance support: No upper extremity supported;Feet supported Sitting balance-Leahy Scale: Good     Standing balance support:  Bilateral upper extremity supported;During functional activity Standing balance-Leahy Scale: Poor Standing balance comment: Reliant on RW for stability                             Cognition Arousal/Alertness: Awake/alert Behavior During Therapy: WFL for tasks assessed/performed Overall Cognitive Status: Within Functional Limits for tasks assessed                                        Exercises      General Comments        Pertinent Vitals/Pain Pain Assessment: No/denies pain    Home Living                      Prior Function            PT Goals (current goals can now be found in the care plan section) Acute Rehab PT Goals PT Goal Formulation: With patient Time For Goal Achievement: 02/13/17 Potential to Achieve Goals: Good Progress towards PT goals: Progressing toward goals    Frequency    Min 3X/week      PT Plan Current plan remains appropriate    Co-evaluation              AM-PAC PT "6 Clicks" Daily Activity  Outcome Measure  Difficulty turning over in bed (including adjusting bedclothes, sheets and blankets)?: A Little Difficulty moving from lying on back to sitting on the side of the bed? : A Little  Difficulty sitting down on and standing up from a chair with arms (e.g., wheelchair, bedside commode, etc,.)?: Unable Help needed moving to and from a bed to chair (including a wheelchair)?: A Little Help needed walking in hospital room?: A Little Help needed climbing 3-5 steps with a railing? : A Little 6 Click Score: 16    End of Session Equipment Utilized During Treatment: Gait belt Activity Tolerance: Patient tolerated treatment well Patient left: in chair;with call bell/phone within reach Nurse Communication: Mobility status PT Visit Diagnosis: Unsteadiness on feet (R26.81);Muscle weakness (generalized) (M62.81)     Time: [id number] PT Time Calculation (min) (ACUTE ONLY): 13 min  Charges:  $Gait  Training: 8-22 mins                    G Codes:       KerrtownJennifer Copeland Neisen, South CarolinaPT, [place]DPT [phone]    Alessandra BevelsJennifer M Jovany Disano 01/31/2017, 12:04 PM

## 2017-01-31 NOTE — Care Management CC44 (Signed)
Condition Code 44 Documentation Completed  Patient Details  Name: [name]: [id number] Date of Birth: [birth year]   Condition Code 44 given:  Yes Patient signature on Condition Code 44 notice:  Yes Documentation of 2 MD's agreement:  Yes Code 44 added to claim:  Yes    RoyalAnnamarie Major, Nyeema Want U, RN 01/31/2017, 4:13 PM

## 2017-01-31 NOTE — Care Management Obs Status (Signed)
MEDICARE OBSERVATION STATUS NOTIFICATION   Patient Details  Name: [name] MRN: [id number] Date of Birth: 1926-06-25   Medicare Observation Status Notification Given:  Yes    Braddock Servellon, Annamarie MajorCheryl U, RN 01/31/2017, 4:12 PM

## 2017-01-31 NOTE — Consult Note (Signed)
[facility] CM Primary Care Navigator  01/31/2017  [name] Apr 25, 1926 [id number]   Met with patient and daughters (Ronell and Gaynell) at the bedside to identify possible discharge needs.  Patientreports having "watery stools" and felt like going to pass out which had led to this admission.  Patient endorses Dr. Leanna Battles with [facility] as theprimary care provider. Patientshared using [employer] on [address] to obtain medications without any problem. Patient states she hasbeenmanaging her own medications at home straight out of the containers.   According to patient, daughter Darliss Ridgel) provides transportation to herdoctor's appointments. Her daughter Darliss Ridgel) lives with her and serves as the primary caregiver at home as stated.  Anticipated discharge plan is home with home health services per therapy recommendation.  Patient expressed understanding to call primary care provider's office when she getshome to schedule a post hospital follow-up within a 1-2 weeks after discharge or sooner if needed. Patient letter (with PCP's contact number) was provided as a reminder.   Explained to patientand daughters about [facility] CM services available for health management at homebut patient denies any current needs or concerns at this point.  However, patient verbally agreed and opted for EMMI calls to follow-up with her recovery at home.   Referral made for [facility] General Calls after discharge.  Patient voicedunderstandingto seekreferral from primary care provider to [facility] care management if deemed necessaryand appropriate for services in the future.  [facility] care management information provided for future needs that may arise.   For additional questions please contact:  Edwena Felty A. Son Barkan, BSN, RN-BC [facility] PRIMARY CARE Navigator Cell: [phone]

## 2017-01-31 NOTE — Care Management Note (Signed)
Case Management Note  Patient Details  Name: [name] MRN: [id number] Date of Birth: 12-Jun-1926  Subjective/Objective:       CM following for progression and d/c planning.              Action/Plan: 01/31/2017 Met with pt and daughters re HH needs. Pt and family agreeable to [facility] services. [facility] to provide [facility]. No DME needs per pt and family.  Expected Discharge Date:  01/31/17               Expected Discharge Plan:  [place]  In-House Referral:  NA  Discharge planning Services  CM Consult  Post Acute Care Choice:  Home Health Choice offered to:  Patient  DME Arranged:  N/A DME Agency:  NA  HH Arranged:  PT, OT,RN, [place] aide. [place] Agency:  [facility]  Status of Service:  Completed, signed off  If discussed at [place] of Stay Meetings, dates discussed:    Additional Comments:  Adron Bene, RN 01/31/2017, 2:13 PM

## 2017-01-31 NOTE — Discharge Summary (Addendum)
Physician Discharge Summary  Robin SilversBernell G Such ZOX:[id number] DOB: 1926/04/30 DOA: 01/29/2017  PCP: Jarome MatinPaterson, Daniel, MD  Admit date: 01/29/2017 Discharge date: 01/31/2017  Admitted From: Home Disposition:  Home  Recommendations for Outpatient Follow-up:  1. Follow up with PCP in 1 week 2. Please obtain CBC in 1 week  3. Please follow up on the following pending results: GI PCR panel  4. Coarse reticulation and ground-glass opacities in lung bases, likely pulmonary fibrosis. Consider nonemergent interstitial lung disease protocol CT of the chest.  5. 2.2 cm right adnexal cystic lesion, probably benign, nonemergent pelvic ultrasound is recommended. This recommendation follows ACR consensus guidelines.   Home Health: PT OT RN Aide  Equipment/Devices: None   Discharge Condition: Stable CODE STATUS: Full  Diet recommendation: Heart healthy   Brief/Interim Summary: [name] is a 83 y.o. female with medical history significant for hypertension, hyperlipidemia, and heart block with pacemaker who presents to the emergency department for evaluation of diarrhea and near syncope. Patient reports that she been in her usual state of health and noon on on 01/29/2017 when she had some watery diarrhea. She went on to have several more episodes and then felt as though she was going to pass out, went to the ground without losing consciousness, sitting back onto her bottom on the floor. There was no chest pain or palpitations associated with this. She denies any fevers or chills, denies abdominal pain, nausea, or vomiting, denies recent travel, and denies sick contacts. She had a normal colonoscopy in 2004. Patient went on to have bloody diarrhea in the ED.C. difficile PCR is negative. CT of the abdomen and pelvis is most notable for a moderate wall thickening of long segment of colon at the splenic flexure compatible with acute colitis without abscess or perforation.  She was admitted for acute  colitis. Throughout her hospitalization, she had no abdominal pain.  C. difficile was negative.  The GI PCR panel is pending at time of discharge.  She was treated with Zosyn.  This is transition to oral Cipro on discharge.  On day of discharge, she was tolerating a soft diet, tolerated lunch, diarrhea has significantly slowed down without any hematochezia or melanotic stools. Hgb has remained stable. BP stable.   Discharge Diagnoses:  Principal Problem:   Acute colitis Active Problems:   Complete heart block (HCC)   Anemia   Near syncope   Mild renal insufficiency   Hyponatremia   Orthostasis   Discharge Instructions  Discharge Instructions    Call MD for:   Complete by:  As directed    Worsening diarrhea with bleeding   Call MD for:  difficulty breathing, headache or visual disturbances   Complete by:  As directed    Call MD for:  extreme fatigue   Complete by:  As directed    Call MD for:  persistant dizziness or light-headedness   Complete by:  As directed    Call MD for:  persistant nausea and vomiting   Complete by:  As directed    Call MD for:  severe uncontrolled pain   Complete by:  As directed    Call MD for:  temperature >100.4   Complete by:  As directed    Diet - low sodium heart healthy   Complete by:  As directed    Discharge instructions   Complete by:  As directed    You were cared for by a hospitalist during your hospital stay. If you have any questions about  your discharge medications or the care you received while you were in the hospital after you are discharged, you can call the unit and asked to speak with the hospitalist on call if the hospitalist that took care of you is not available. Once you are discharged, your primary care physician will handle any further medical issues. Please note that NO REFILLS for any discharge medications will be authorized once you are discharged, as it is imperative that you return to your primary care physician (or establish  a relationship with a primary care physician if you do not have one) for your aftercare needs so that they can reassess your need for medications and monitor your lab values.   Increase activity slowly   Complete by:  As directed      Allergies as of 01/31/2017   No Known Allergies     Medication List    TAKE these medications   acetaminophen 650 MG CR tablet Commonly known as:  TYLENOL Take 650 mg by mouth 2 (two) times daily.   aspirin EC 81 MG tablet Take 81 mg by mouth daily with breakfast.   calcium-vitamin D 500-200 MG-UNIT tablet Commonly known as:  OSCAL WITH D Take 1 tablet by mouth 2 (two) times daily.   carbidopa-levodopa 10-100 MG tablet Commonly known as:  SINEMET IR Take 1 tablet by mouth 2 (two) times daily.   ciprofloxacin 500 MG tablet Commonly known as:  CIPRO Take 1 tablet (500 mg total) by mouth 2 (two) times daily for 3 days.   docusate sodium 100 MG capsule Commonly known as:  COLACE Take 100 mg by mouth daily as needed for mild constipation.   ergocalciferol 50000 units capsule Commonly known as:  VITAMIN D2 Take 50,000 Units by mouth every 14 (fourteen) days.   losartan-hydrochlorothiazide 50-12.5 MG tablet Commonly known as:  HYZAAR Take 1 tablet by mouth daily with breakfast.   metoCLOPramide 5 MG tablet Commonly known as:  REGLAN Take 1 tablet by mouth daily with supper.   NIFEdipine 30 MG 24 hr tablet Commonly known as:  PROCARDIA-XL/ADALAT-CC/NIFEDICAL-XL Take 1 tablet (30 mg total) by mouth daily.   oxybutynin 5 MG tablet Commonly known as:  DITROPAN Take 5 mg by mouth at bedtime.   pantoprazole 40 MG tablet Commonly known as:  PROTONIX Take 40 mg by mouth daily with breakfast.   simvastatin 20 MG tablet Commonly known as:  ZOCOR Take 20 mg by mouth at bedtime.   SYSTANE 0.4-0.3 % Soln Generic drug:  Polyethyl Glycol-Propyl Glycol Place 1 drop into both eyes 2 (two) times daily.   vitamin B-12 500 MCG tablet Commonly  known as:  CYANOCOBALAMIN Take 500 mcg by mouth daily with breakfast.       No Known Allergies  Consultations:  None   Procedures/Studies: Ct Abdomen Pelvis W Contrast  Result Date: 01/30/2017 CLINICAL DATA:  83 y/o  F; diarrhea and bloody stool. EXAM: CT ABDOMEN AND PELVIS WITH CONTRAST TECHNIQUE: Multidetector CT imaging of the abdomen and pelvis was performed using the standard protocol following bolus administration of intravenous contrast. CONTRAST:  100mL ISOVUE-300 IOPAMIDOL (ISOVUE-300) INJECTION 61% COMPARISON:  11/24/2015 chest radiograph FINDINGS: Lower chest: Coarse reticulation and ground-glass opacities at the lung bases with patchy areas of sparing probably representing pulmonary fibrosis. Moderate cardiomegaly. Hepatobiliary: No focal liver abnormality is seen. No gallstones, gallbladder wall thickening, or biliary dilatation. Pancreas: Unremarkable. No pancreatic ductal dilatation or surrounding inflammatory changes. Spleen: Normal in size without focal abnormality. Adrenals/Urinary Tract: Normal  adrenal glands. Right kidney upper pole 19 mm cyst. No other focal kidney lesion identified. No hydronephrosis. Mild bladder distention. Stomach/Bowel: Stomach is within normal limits. Appendix not identified. No pericecal inflammation. Moderate wall thickening of long segment of colon at the splenic flexure. Normal appearance of small bowel. Vascular/Lymphatic: Aortic atherosclerosis. No enlarged abdominal or pelvic lymph nodes. Reproductive: Status post hysterectomy. 2.2 cm oval cystic lesion in right adnexa, poorly defined margin, probably benign (series 6, image 74). Other: Small right inguinal hernia containing fat. Musculoskeletal: T12 and L1 superior endplate fractures with mild anterior loss of height, grossly stable from prior chest radiograph. Moderate lumbar spine degenerative changes with loss of disc space height greatest at the L3-4 and L4-5 levels. IMPRESSION: 1. Moderate wall  thickening of long segment of colon at splenic flexure compatible with acute colitis. No evidence for perforation or abscess. 2. Coarse reticulation and ground-glass opacities in lung bases, likely pulmonary fibrosis. Consider nonemergent interstitial lung disease protocol CT of the chest. 3. Moderate cardiomegaly. 4. Severe aortic atherosclerosis. 5. 2.2 cm right adnexal cystic lesion, probably benign, nonemergent pelvic ultrasound is recommended. This recommendation follows ACR consensus guidelines: White Paper of the ACR Incidental Findings Committee II on Adnexal Findings. J Am Coll Radiol 2013:10:675-681. 6. T12 and L1 anterior compression deformities, grossly stable from prior chest radiograph given differences in technique. Electronically Signed   By: Mitzi Hansen M.D.   On: 01/30/2017 03:58       Discharge Exam: Vitals:   01/31/17 0524 01/31/17 0945  BP: (!) 137/51 (!) 155/54  Pulse: 60 70  Resp: 18 18  Temp: 98.6 F (37 C) 98.2 F (36.8 C)  SpO2: 100% 100%   Vitals:   01/30/17 2044 01/31/17 0317 01/31/17 0524 01/31/17 0945  BP: (!) 162/88  (!) 137/51 (!) 155/54  Pulse: 62  60 70  Resp: 18  18 18   Temp: 98.4 F (36.9 C)  98.6 F (37 C) 98.2 F (36.8 C)  TempSrc: Oral  Oral Oral  SpO2: 98%  100% 100%  Weight: 67.4 kg (148 lb 9.4 oz) 67.4 kg (148 lb 9.4 oz)    Height:        General: Pt is alert, awake, not in acute distress Cardiovascular: RRR, S1/S2 +, no rubs, no gallops Respiratory: CTA bilaterally, no wheezing, no rhonchi Abdominal: Soft, NT, ND, bowel sounds + Extremities: no edema, no cyanosis    The results of significant diagnostics from this hospitalization (including imaging, microbiology, ancillary and laboratory) are listed below for reference.     Microbiology: Recent Results (from the past 240 hour(s))  C difficile quick scan w PCR reflex     Status: None   Collection Time: 01/29/17 11:28 PM  Result Value Ref Range Status   C Diff  antigen NEGATIVE NEGATIVE Final   C Diff toxin NEGATIVE NEGATIVE Final   C Diff interpretation No C. difficile detected.  Final     Labs: BNP (last 3 results) No results for input(s): BNP in the last 8760 hours. Basic Metabolic Panel: Recent Labs  Lab 01/29/17 2236 01/30/17 0454 01/31/17 0345  NA 132* 133* 133*  K 3.4* 3.6 3.6  CL 95* 100* 101  CO2 26 27 26   GLUCOSE 119* 97 88  BUN 16 13 6   CREATININE 1.19* 1.06* 0.97  CALCIUM 9.1 8.4* 8.3*   Liver Function Tests: Recent Labs  Lab 01/30/17 0454  AST 20  ALT 10*  ALKPHOS 59  BILITOT 0.4  PROT 6.3*  ALBUMIN 2.9*  No results for input(s): LIPASE, AMYLASE in the last 168 hours. No results for input(s): AMMONIA in the last 168 hours. CBC: Recent Labs  Lab 01/29/17 2236 01/30/17 0454 01/31/17 0345  WBC 5.8 3.7* 3.6*  NEUTROABS  --  2.5  --   HGB 10.5* 8.6* 8.5*  HCT 33.0* 27.1* 27.1*  MCV 93.8 94.4 93.4  PLT 142* 125* 132*   Cardiac Enzymes: No results for input(s): CKTOTAL, CKMB, CKMBINDEX, TROPONINI in the last 168 hours. BNP: Invalid input(s): POCBNP CBG: Recent Labs  Lab 01/29/17 2251  GLUCAP 92   D-Dimer No results for input(s): DDIMER in the last 72 hours. Hgb A1c No results for input(s): HGBA1C in the last 72 hours. Lipid Profile No results for input(s): CHOL, HDL, LDLCALC, TRIG, CHOLHDL, LDLDIRECT in the last 72 hours. Thyroid function studies No results for input(s): TSH, T4TOTAL, T3FREE, THYROIDAB in the last 72 hours.  Invalid input(s): FREET3 Anemia work up No results for input(s): VITAMINB12, FOLATE, FERRITIN, TIBC, IRON, RETICCTPCT in the last 72 hours. Urinalysis    Component Value Date/Time   COLORURINE STRAW (A) 01/30/2017 0250   APPEARANCEUR CLEAR 01/30/2017 0250   LABSPEC 1.005 01/30/2017 0250   PHURINE 6.0 01/30/2017 0250   GLUCOSEU NEGATIVE 01/30/2017 0250   HGBUR SMALL (A) 01/30/2017 0250   BILIRUBINUR NEGATIVE 01/30/2017 0250   KETONESUR NEGATIVE 01/30/2017 0250    PROTEINUR NEGATIVE 01/30/2017 0250   UROBILINOGEN 0.2 08/07/2014 1310   NITRITE NEGATIVE 01/30/2017 0250   LEUKOCYTESUR NEGATIVE 01/30/2017 0250   Sepsis Labs Invalid input(s): PROCALCITONIN,  WBC,  LACTICIDVEN Microbiology Recent Results (from the past 240 hour(s))  C difficile quick scan w PCR reflex     Status: None   Collection Time: 01/29/17 11:28 PM  Result Value Ref Range Status   C Diff antigen NEGATIVE NEGATIVE Final   C Diff toxin NEGATIVE NEGATIVE Final   C Diff interpretation No C. difficile detected.  Final     Time coordinating discharge: 40 minutes  SIGNED:  Noralee Stain, DO [facility] Pager [phone]  If 7PM-7AM, please contact night-coverage [email or web address] Password [email or web address] 01/31/2017, 2:30 PM

## 2017-02-03 ENCOUNTER — Ambulatory Visit: Payer: Medicare PPO

## 2017-02-05 DIAGNOSIS — Z7982 Long term (current) use of aspirin: Secondary | ICD-10-CM | POA: Diagnosis not present

## 2017-02-05 DIAGNOSIS — I739 Peripheral vascular disease, unspecified: Secondary | ICD-10-CM | POA: Diagnosis not present

## 2017-02-05 DIAGNOSIS — D649 Anemia, unspecified: Secondary | ICD-10-CM | POA: Diagnosis not present

## 2017-02-05 DIAGNOSIS — M1990 Unspecified osteoarthritis, unspecified site: Secondary | ICD-10-CM | POA: Diagnosis not present

## 2017-02-05 DIAGNOSIS — Z950 Presence of cardiac pacemaker: Secondary | ICD-10-CM | POA: Diagnosis not present

## 2017-02-05 DIAGNOSIS — K529 Noninfective gastroenteritis and colitis, unspecified: Secondary | ICD-10-CM | POA: Diagnosis not present

## 2017-02-05 DIAGNOSIS — E785 Hyperlipidemia, unspecified: Secondary | ICD-10-CM | POA: Diagnosis not present

## 2017-02-05 DIAGNOSIS — N838 Other noninflammatory disorders of ovary, fallopian tube and broad ligament: Secondary | ICD-10-CM | POA: Diagnosis not present

## 2017-02-05 DIAGNOSIS — I119 Hypertensive heart disease without heart failure: Secondary | ICD-10-CM | POA: Diagnosis not present

## 2017-02-06 DIAGNOSIS — D649 Anemia, unspecified: Secondary | ICD-10-CM | POA: Diagnosis not present

## 2017-02-06 DIAGNOSIS — N838 Other noninflammatory disorders of ovary, fallopian tube and broad ligament: Secondary | ICD-10-CM | POA: Diagnosis not present

## 2017-02-06 DIAGNOSIS — E785 Hyperlipidemia, unspecified: Secondary | ICD-10-CM | POA: Diagnosis not present

## 2017-02-06 DIAGNOSIS — M1990 Unspecified osteoarthritis, unspecified site: Secondary | ICD-10-CM | POA: Diagnosis not present

## 2017-02-06 DIAGNOSIS — Z950 Presence of cardiac pacemaker: Secondary | ICD-10-CM | POA: Diagnosis not present

## 2017-02-06 DIAGNOSIS — K529 Noninfective gastroenteritis and colitis, unspecified: Secondary | ICD-10-CM | POA: Diagnosis not present

## 2017-02-06 DIAGNOSIS — I739 Peripheral vascular disease, unspecified: Secondary | ICD-10-CM | POA: Diagnosis not present

## 2017-02-06 DIAGNOSIS — Z7982 Long term (current) use of aspirin: Secondary | ICD-10-CM | POA: Diagnosis not present

## 2017-02-06 DIAGNOSIS — I119 Hypertensive heart disease without heart failure: Secondary | ICD-10-CM | POA: Diagnosis not present

## 2017-02-11 DIAGNOSIS — R197 Diarrhea, unspecified: Secondary | ICD-10-CM | POA: Diagnosis not present

## 2017-02-13 NOTE — Progress Notes (Signed)
   01/30/17 1737  PT G-Codes **NOT FOR INPATIENT CLASS**  Functional Assessment Tool Used AM-PAC 6 Clicks Basic Mobility  Functional Limitation Mobility: Walking and moving around  Mobility: Walking and Moving Around Current Status [phone](G8978) CK  Mobility: Walking and Moving Around Goal Status [phone](G8979) CI   Inserting G codes  Gladys DammeBrittany Tanesia Butner, PT, DPT  Acute Rehabilitation Services  Pager: [phone]

## 2017-02-14 ENCOUNTER — Other Ambulatory Visit: Payer: Self-pay | Admitting: Family Medicine

## 2017-02-14 DIAGNOSIS — N83291 Other ovarian cyst, right side: Secondary | ICD-10-CM

## 2017-03-01 DIAGNOSIS — K529 Noninfective gastroenteritis and colitis, unspecified: Secondary | ICD-10-CM | POA: Diagnosis not present

## 2017-03-01 DIAGNOSIS — K219 Gastro-esophageal reflux disease without esophagitis: Secondary | ICD-10-CM | POA: Diagnosis not present

## 2017-03-01 DIAGNOSIS — R197 Diarrhea, unspecified: Secondary | ICD-10-CM | POA: Diagnosis not present

## 2017-03-01 DIAGNOSIS — D6489 Other specified anemias: Secondary | ICD-10-CM | POA: Diagnosis not present

## 2017-03-01 DIAGNOSIS — R918 Other nonspecific abnormal finding of lung field: Secondary | ICD-10-CM | POA: Diagnosis not present

## 2017-03-01 DIAGNOSIS — I10 Essential (primary) hypertension: Secondary | ICD-10-CM | POA: Diagnosis not present

## 2017-03-01 DIAGNOSIS — N182 Chronic kidney disease, stage 2 (mild): Secondary | ICD-10-CM | POA: Diagnosis not present

## 2017-03-01 DIAGNOSIS — N83201 Unspecified ovarian cyst, right side: Secondary | ICD-10-CM | POA: Diagnosis not present

## 2017-03-03 ENCOUNTER — Ambulatory Visit (INDEPENDENT_AMBULATORY_CARE_PROVIDER_SITE_OTHER): Payer: Medicare PPO | Admitting: *Deleted

## 2017-03-03 DIAGNOSIS — I442 Atrioventricular block, complete: Secondary | ICD-10-CM | POA: Diagnosis not present

## 2017-03-03 NOTE — Progress Notes (Signed)
Remote pacemaker transmission.   

## 2017-03-04 ENCOUNTER — Encounter: Payer: Self-pay | Admitting: Cardiology

## 2017-03-04 ENCOUNTER — Ambulatory Visit
Admission: RE | Admit: 2017-03-04 | Discharge: 2017-03-04 | Disposition: A | Discharge status: Home / Self Care | Payer: Medicare PPO | Source: Ambulatory Visit | Attending: Internal Medicine | Admitting: Internal Medicine

## 2017-03-04 DIAGNOSIS — Z1231 Encounter for screening mammogram for malignant neoplasm of breast: Secondary | ICD-10-CM | POA: Diagnosis not present

## 2017-03-05 ENCOUNTER — Encounter: Payer: Medicare PPO | Admitting: Nurse Practitioner

## 2017-03-05 ENCOUNTER — Other Ambulatory Visit: Payer: Self-pay | Admitting: Internal Medicine

## 2017-03-05 DIAGNOSIS — R928 Other abnormal and inconclusive findings on diagnostic imaging of breast: Secondary | ICD-10-CM

## 2017-03-10 ENCOUNTER — Ambulatory Visit
Admission: RE | Admit: 2017-03-10 | Discharge: 2017-03-10 | Disposition: A | Discharge status: Home / Self Care | Payer: Medicare PPO | Source: Ambulatory Visit | Attending: Internal Medicine | Admitting: Internal Medicine

## 2017-03-10 DIAGNOSIS — R928 Other abnormal and inconclusive findings on diagnostic imaging of breast: Secondary | ICD-10-CM | POA: Diagnosis not present

## 2017-03-10 LAB — CUP PACEART REMOTE DEVICE CHECK
Battery Remaining Percentage: 95.5 %
Brady Statistic AP VP Percent: 52 %
Brady Statistic AP VS Percent: 1 %
Brady Statistic AS VP Percent: 48 %
Brady Statistic AS VS Percent: 1 %
Brady Statistic RV Percent Paced: 99 %
Implantable Lead Implant Date: 20170928
Implantable Lead Location: 753860
Lead Channel Impedance Value: 390 Ohm
Lead Channel Pacing Threshold Amplitude: 1 V
Lead Channel Pacing Threshold Pulse Width: 0.4 ms
Lead Channel Sensing Intrinsic Amplitude: 12 mV
Lead Channel Sensing Intrinsic Amplitude: 3.5 mV
Lead Channel Setting Pacing Amplitude: 2.5 V
Lead Channel Setting Pacing Pulse Width: 0.4 ms
Lead Channel Setting Sensing Sensitivity: 2 mV
MDC IDC LEAD IMPLANT DT: 20170928
MDC IDC LEAD LOCATION: 753859
MDC IDC MSMT BATTERY REMAINING LONGEVITY: 104 mo
MDC IDC MSMT BATTERY VOLTAGE: 3.01 V
MDC IDC MSMT LEADCHNL RA IMPEDANCE VALUE: 390 Ohm
MDC IDC MSMT LEADCHNL RA PACING THRESHOLD AMPLITUDE: 0.5 V
MDC IDC MSMT LEADCHNL RA PACING THRESHOLD PULSEWIDTH: 0.4 ms
MDC IDC PG IMPLANT DT: 20170928
MDC IDC PG SERIAL: 7951003
MDC IDC SESS DTM: 20190107070020
MDC IDC SET LEADCHNL RA PACING AMPLITUDE: 2 V
MDC IDC STAT BRADY RA PERCENT PACED: 52 %

## 2017-03-28 DIAGNOSIS — Z950 Presence of cardiac pacemaker: Secondary | ICD-10-CM | POA: Diagnosis not present

## 2017-03-28 DIAGNOSIS — G20 Parkinson's disease: Secondary | ICD-10-CM | POA: Diagnosis not present

## 2017-03-28 DIAGNOSIS — H269 Unspecified cataract: Secondary | ICD-10-CM | POA: Diagnosis not present

## 2017-03-28 DIAGNOSIS — E785 Hyperlipidemia, unspecified: Secondary | ICD-10-CM | POA: Diagnosis not present

## 2017-03-28 DIAGNOSIS — Z90710 Acquired absence of both cervix and uterus: Secondary | ICD-10-CM | POA: Diagnosis not present

## 2017-03-28 DIAGNOSIS — I10 Essential (primary) hypertension: Secondary | ICD-10-CM | POA: Diagnosis not present

## 2017-04-01 NOTE — Progress Notes (Signed)
Electrophysiology Office Note Date: 04/02/2017  ID:  [name], DOB 09/09/1926, MRN [id number]  PCP: Jarome MatinPaterson, Daniel, MD Electrophysiologist: [name]  CC: Pacemaker follow-up  [name] is a 84 y.o. female seen today for Robin [name].  She presents today for routine electrophysiology followup.  Since last being seen in our clinic, the patient reports doing very well.  She lives with her daughter and 2 grandsons.  She denies chest pain, palpitations, dyspnea, PND, orthopnea, nausea, vomiting, dizziness, syncope, edema, weight gain, or early satiety.  Device History: STJ dual chamber PPM implanted 2017 for Mobitz II   Past Medical History:  Diagnosis Date  . Arthritis   . High cholesterol   . Hypertension   . Paroxysmal atrial fibrillation (HCC)    a. identified on device interrogation 03/2017, all episodes to date <1 minute  . S/P hysterectomy   . Second degree AV block    a. s/p STJ dual chamber PPM Robin [name]  . Syncope    Past Surgical History:  Procedure Laterality Date  . ABDOMINAL HYSTERECTOMY    . EP IMPLANTABLE DEVICE N/A 11/23/2015   SJM Assurity Robin pacemaker implanted by Robin [name] for second degree AV block with presyncope  . LOOP RECORDER EXPLANT N/A 03/13/2012   Procedure: LOOP RECORDER EXPLANT;  Surgeon: Robin MawGregg W Taylor, MD;  [facility];  Service: Cardiovascular;  Laterality: N/A;  . REPLACEMENT TOTAL KNEE Left     Current Outpatient Medications  Medication Sig Dispense Refill  . acetaminophen (TYLENOL) 650 MG CR tablet Take 650 mg by mouth 2 (two) times daily.    Marland Kitchen. aspirin EC 81 MG tablet Take 81 mg by mouth daily with breakfast.     . calcium-vitamin D (OSCAL WITH D) 500-200 MG-UNIT per tablet Take 1 tablet by mouth 2 (two) times daily.    . carbidopa-levodopa (SINEMET IR) 10-100 MG tablet Take 1 tablet by mouth 2 (two) times daily.  12  . docusate sodium (COLACE) 100 MG capsule Take 100 mg by mouth daily as needed for mild constipation.     . ergocalciferol (VITAMIN D2) 50000 UNITS capsule Take 50,000 Units by mouth every 14 (fourteen) days.    Marland Kitchen. losartan-hydrochlorothiazide (HYZAAR) 50-12.5 MG tablet Take 1 tablet by mouth daily with breakfast.   0  . metoCLOPramide (REGLAN) 5 MG tablet Take 1 tablet by mouth daily with supper.  6  . NIFEdipine (PROCARDIA-XL/ADALAT-CC/NIFEDICAL-XL) 30 MG 24 hr tablet Take 1 tablet (30 mg total) by mouth daily. 90 tablet 2  . oxybutynin (DITROPAN) 5 MG tablet Take 5 mg by mouth at bedtime.     . pantoprazole (PROTONIX) 40 MG tablet Take 40 mg by mouth daily with breakfast.    . Polyethyl Glycol-Propyl Glycol (SYSTANE) 0.4-0.3 % SOLN Place 1 drop into both eyes 2 (two) times daily.    . simvastatin (ZOCOR) 20 MG tablet Take 20 mg by mouth at bedtime.     . vitamin B-12 (CYANOCOBALAMIN) 500 MCG tablet Take 500 mcg by mouth daily with breakfast.     No current facility-administered medications for this visit.     Allergies:   Patient has no known allergies.   Social History: Social History   Socioeconomic History  . Marital status: Unknown    Spouse name: Not on file  . Number of children: 2  . Years of education: Not on file  . Highest education level: Not on file  Social Needs  . Financial resource strain: Not on  file  . Food insecurity - worry: Not on file  . Food insecurity - inability: Not on file  . Transportation needs - medical: Not on file  . Transportation needs - non-medical: Not on file  Occupational History  . Occupation: retired    Associate Professor: Social research officer, [employer]    Comment: Dietary    Employer: RETIRED  Tobacco Use  . Smoking status: Never Smoker  . Smokeless tobacco: Never Used  Substance and Sexual Activity  . Alcohol use: No    Alcohol/week: 0.0 oz  . Drug use: No  . Sexual activity: Not on file  Other Topics Concern  . Not on file  Social History Narrative           Family History: Family History  Problem Relation Age of Onset  . Diabetes Other       Review of Systems: All other systems reviewed and are otherwise negative except as noted above.   Physical Exam: VS:  BP (!) 144/60   Pulse 65   Ht 5\' 3"  (1.6 m)   Wt 151 lb (68.5 kg)   SpO2 95%   BMI 26.75 kg/m  , BMI Body mass index is 26.75 kg/m.  GEN- The patient is elderly appearing, alert and oriented x 3 today.   HEENT: normocephalic, atraumatic; sclera clear, conjunctiva pink; hearing intact; oropharynx clear; neck supple  Lungs- Clear to ausculation bilaterally, normal work of breathing.  No wheezes, rales, rhonchi Heart- Regular rate and rhythm (paced) GI- soft, non-tender, non-distended, bowel sounds present  Extremities- no clubbing, cyanosis, or edema  MS- no significant deformity or atrophy Skin- warm and dry, no rash or lesion; PPM pocket well healed Psych- euthymic mood, full affect Neuro- strength and sensation are intact  PPM Interrogation- reviewed in detail today,  See PACEART report  EKG:  EKG is ordered today. The ekg ordered today shows sinus rhythm, V pacing, rate 65  Recent Labs: 01/30/2017: ALT 10 01/31/2017: BUN 6; Creatinine, Ser 0.97; Hemoglobin 8.5; Platelets 132; Potassium 3.6; Sodium 133   Wt Readings from Last 3 Encounters:  04/02/17 151 lb (68.5 kg)  01/31/17 148 lb 9.4 oz (67.4 kg)  03/04/16 154 lb 6.4 oz (70 kg)     Other studies Reviewed: Additional studies/ records that were reviewed today include: [name] office notes   Assessment and Plan:  1.  Mobitz II Normal PPM function See Pace Art report No changes today  2.  HTN Stable No change required today  3.  Paroxysmal atrial fibrillation Identified on device interrogation All episodes <1 minute Will follow Discussed with patient today    Current medicines are reviewed at length with the patient today.   The patient does not have concerns regarding her medicines.  The following changes were made today:  none  Labs/ tests ordered today include:  none Orders Placed This Encounter  Procedures  . CUP PACEART INCLINIC DEVICE CHECK  . EKG 12-Lead     Disposition:   Follow up with Arsenio Katz, Robin Robin Frame 1 year     Signed, Gypsy Balsam, NP 04/02/2017 11:55 AM  [facility] [address] [place] [address] [phone] (office) [phone] (fax)

## 2017-04-02 ENCOUNTER — Encounter: Payer: Self-pay | Admitting: Nurse Practitioner

## 2017-04-02 ENCOUNTER — Ambulatory Visit: Payer: Medicare PPO | Admitting: Nurse Practitioner

## 2017-04-02 VITALS — BP 144/60 | HR 65 | Ht 63.0 in | Wt 151.0 lb

## 2017-04-02 DIAGNOSIS — I10 Essential (primary) hypertension: Secondary | ICD-10-CM

## 2017-04-02 DIAGNOSIS — I441 Atrioventricular block, second degree: Secondary | ICD-10-CM

## 2017-04-02 DIAGNOSIS — I480 Paroxysmal atrial fibrillation: Secondary | ICD-10-CM

## 2017-04-02 LAB — CUP PACEART INCLINIC DEVICE CHECK
Date Time Interrogation Session: 20190206113805
Implantable Lead Implant Date: 20170928
Implantable Lead Location: 753859
Implantable Lead Model: 5076
MDC IDC LEAD IMPLANT DT: 20170928
MDC IDC LEAD LOCATION: 753860
MDC IDC PG IMPLANT DT: 20170928
MDC IDC PG SERIAL: 7951003

## 2017-04-02 NOTE — Patient Instructions (Addendum)
Medication Instructions:   Your physician recommends that you continue on your current medications as directed. Please refer to the Current Medication list given to you today.   If you need a refill on your cardiac medications before your next appointment, please call your pharmacy.  Labwork: NONE ORDERED  TODAY    Testing/Procedures: NONE ORDERED  TODAY    Follow-Up:  Your physician wants you to follow-up in: ONE YEAR WITH  ALLRED You will receive a reminder letter in the mail two months in advance. If you don't receive a letter, please call our office to schedule the follow-up appointment.  Remote monitoring is used to monitor your Pacemaker of ICD from home. This monitoring reduces the number of office visits required to check your device to one time per year. It allows us to keep an eye on the functioning of your device to ensure it is working properly. You are scheduled for a device check from home on .  06-02-17 You may send your transmission at any time that day. If you have a wireless device, the transmission will be sent automatically. After your physician reviews your transmission, you will receive a postcard with your next transmission date. \   Any Other Special Instructions Will Be Listed Below (If Applicable).                                                                                                                                                 ]

## 2017-04-24 ENCOUNTER — Other Ambulatory Visit: Payer: Self-pay | Admitting: Internal Medicine

## 2017-04-24 DIAGNOSIS — I119 Hypertensive heart disease without heart failure: Secondary | ICD-10-CM

## 2017-06-02 ENCOUNTER — Ambulatory Visit (INDEPENDENT_AMBULATORY_CARE_PROVIDER_SITE_OTHER): Payer: Medicare PPO | Admitting: *Deleted

## 2017-06-02 DIAGNOSIS — I441 Atrioventricular block, second degree: Secondary | ICD-10-CM

## 2017-06-03 NOTE — Progress Notes (Signed)
Remote pacemaker transmission.   

## 2017-06-05 ENCOUNTER — Encounter: Payer: Self-pay | Admitting: Cardiology

## 2017-06-25 LAB — CUP PACEART REMOTE DEVICE CHECK
Battery Voltage: 3.02 V
Brady Statistic AP VP Percent: 59 %
Brady Statistic RA Percent Paced: 59 %
Brady Statistic RV Percent Paced: 99 %
Implantable Lead Implant Date: 20170928
Implantable Pulse Generator Implant Date: 20170928
Lead Channel Impedance Value: 390 Ohm
Lead Channel Impedance Value: 400 Ohm
Lead Channel Pacing Threshold Amplitude: 0.5 V
Lead Channel Pacing Threshold Pulse Width: 0.4 ms
Lead Channel Setting Pacing Amplitude: 1.375
Lead Channel Setting Sensing Sensitivity: 2 mV
MDC IDC LEAD IMPLANT DT: 20170928
MDC IDC LEAD LOCATION: 753859
MDC IDC LEAD LOCATION: 753860
MDC IDC MSMT BATTERY REMAINING LONGEVITY: 116 mo
MDC IDC MSMT BATTERY REMAINING PERCENTAGE: 95.5 %
MDC IDC MSMT LEADCHNL RA SENSING INTR AMPL: 4.4 mV
MDC IDC MSMT LEADCHNL RV PACING THRESHOLD AMPLITUDE: 1.125 V
MDC IDC MSMT LEADCHNL RV PACING THRESHOLD PULSEWIDTH: 0.4 ms
MDC IDC MSMT LEADCHNL RV SENSING INTR AMPL: 7.4 mV
MDC IDC PG SERIAL: 7951003
MDC IDC SESS DTM: 20190408060014
MDC IDC SET LEADCHNL RA PACING AMPLITUDE: 2 V
MDC IDC SET LEADCHNL RV PACING PULSEWIDTH: 0.4 ms
MDC IDC STAT BRADY AP VS PERCENT: 1 %
MDC IDC STAT BRADY AS VP PERCENT: 41 %
MDC IDC STAT BRADY AS VS PERCENT: 1 %

## 2017-09-01 ENCOUNTER — Ambulatory Visit (INDEPENDENT_AMBULATORY_CARE_PROVIDER_SITE_OTHER): Payer: Medicare PPO | Admitting: *Deleted

## 2017-09-01 DIAGNOSIS — I441 Atrioventricular block, second degree: Secondary | ICD-10-CM

## 2017-09-01 DIAGNOSIS — R55 Syncope and collapse: Secondary | ICD-10-CM | POA: Diagnosis not present

## 2017-09-01 NOTE — Progress Notes (Signed)
Remote pacemaker transmission.   

## 2017-09-07 LAB — CUP PACEART REMOTE DEVICE CHECK
Battery Voltage: 3.02 V
Brady Statistic AP VP Percent: 56 %
Brady Statistic RA Percent Paced: 55 %
Brady Statistic RV Percent Paced: 99 %
Implantable Lead Implant Date: 20170928
Implantable Lead Location: 753859
Implantable Lead Model: 5076
Implantable Pulse Generator Implant Date: 20170928
Lead Channel Impedance Value: 380 Ohm
Lead Channel Impedance Value: 400 Ohm
Lead Channel Pacing Threshold Amplitude: 0.5 V
Lead Channel Pacing Threshold Amplitude: 1 V
Lead Channel Pacing Threshold Pulse Width: 0.4 ms
Lead Channel Setting Pacing Pulse Width: 0.4 ms
Lead Channel Setting Sensing Sensitivity: 2 mV
MDC IDC LEAD IMPLANT DT: 20170928
MDC IDC LEAD LOCATION: 753860
MDC IDC MSMT BATTERY REMAINING LONGEVITY: 116 mo
MDC IDC MSMT BATTERY REMAINING PERCENTAGE: 95.5 %
MDC IDC MSMT LEADCHNL RA SENSING INTR AMPL: 3.4 mV
MDC IDC MSMT LEADCHNL RV PACING THRESHOLD PULSEWIDTH: 0.4 ms
MDC IDC MSMT LEADCHNL RV SENSING INTR AMPL: 5.8 mV
MDC IDC PG SERIAL: 7951003
MDC IDC SESS DTM: 20190708060019
MDC IDC SET LEADCHNL RA PACING AMPLITUDE: 2 V
MDC IDC SET LEADCHNL RV PACING AMPLITUDE: 1.25 V
MDC IDC STAT BRADY AP VS PERCENT: 1 %
MDC IDC STAT BRADY AS VP PERCENT: 44 %
MDC IDC STAT BRADY AS VS PERCENT: 1 %

## 2017-10-02 DIAGNOSIS — F0390 Unspecified dementia without behavioral disturbance: Secondary | ICD-10-CM | POA: Diagnosis not present

## 2017-10-02 DIAGNOSIS — D6489 Other specified anemias: Secondary | ICD-10-CM | POA: Diagnosis not present

## 2017-10-02 DIAGNOSIS — Z1389 Encounter for screening for other disorder: Secondary | ICD-10-CM | POA: Diagnosis not present

## 2017-10-02 DIAGNOSIS — I10 Essential (primary) hypertension: Secondary | ICD-10-CM | POA: Diagnosis not present

## 2017-10-02 DIAGNOSIS — Z6827 Body mass index (BMI) 27.0-27.9, adult: Secondary | ICD-10-CM | POA: Diagnosis not present

## 2017-10-02 DIAGNOSIS — R251 Tremor, unspecified: Secondary | ICD-10-CM | POA: Diagnosis not present

## 2017-12-01 ENCOUNTER — Ambulatory Visit (INDEPENDENT_AMBULATORY_CARE_PROVIDER_SITE_OTHER): Payer: Medicare PPO | Admitting: *Deleted

## 2017-12-01 DIAGNOSIS — I441 Atrioventricular block, second degree: Secondary | ICD-10-CM | POA: Diagnosis not present

## 2017-12-01 NOTE — Progress Notes (Signed)
Remote pacemaker transmission.   

## 2017-12-10 ENCOUNTER — Encounter: Payer: Self-pay | Admitting: Cardiology

## 2017-12-12 DIAGNOSIS — M545 Low back pain: Secondary | ICD-10-CM | POA: Diagnosis not present

## 2017-12-12 DIAGNOSIS — E7849 Other hyperlipidemia: Secondary | ICD-10-CM | POA: Diagnosis not present

## 2017-12-12 DIAGNOSIS — G250 Essential tremor: Secondary | ICD-10-CM | POA: Diagnosis not present

## 2017-12-12 DIAGNOSIS — I10 Essential (primary) hypertension: Secondary | ICD-10-CM | POA: Diagnosis not present

## 2017-12-12 DIAGNOSIS — Z6827 Body mass index (BMI) 27.0-27.9, adult: Secondary | ICD-10-CM | POA: Diagnosis not present

## 2017-12-12 DIAGNOSIS — Z23 Encounter for immunization: Secondary | ICD-10-CM | POA: Diagnosis not present

## 2017-12-12 DIAGNOSIS — F0390 Unspecified dementia without behavioral disturbance: Secondary | ICD-10-CM | POA: Diagnosis not present

## 2018-01-16 LAB — CUP PACEART REMOTE DEVICE CHECK
Battery Remaining Longevity: 118 mo
Battery Remaining Percentage: 95.5 %
Battery Voltage: 3.01 V
Brady Statistic AP VS Percent: 1 %
Brady Statistic AS VP Percent: 53 %
Brady Statistic RA Percent Paced: 46 %
Date Time Interrogation Session: 20191007072554
Implantable Lead Implant Date: 20170928
Implantable Lead Location: 753859
Lead Channel Impedance Value: 390 Ohm
Lead Channel Pacing Threshold Amplitude: 1 V
Lead Channel Pacing Threshold Pulse Width: 0.4 ms
Lead Channel Pacing Threshold Pulse Width: 0.4 ms
Lead Channel Sensing Intrinsic Amplitude: 4.4 mV
Lead Channel Sensing Intrinsic Amplitude: 7.3 mV
Lead Channel Setting Pacing Amplitude: 2 V
MDC IDC LEAD IMPLANT DT: 20170928
MDC IDC LEAD LOCATION: 753860
MDC IDC MSMT LEADCHNL RA PACING THRESHOLD AMPLITUDE: 0.5 V
MDC IDC MSMT LEADCHNL RV IMPEDANCE VALUE: 380 Ohm
MDC IDC PG IMPLANT DT: 20170928
MDC IDC PG SERIAL: 7951003
MDC IDC SET LEADCHNL RV PACING AMPLITUDE: 1.25 V
MDC IDC SET LEADCHNL RV PACING PULSEWIDTH: 0.4 ms
MDC IDC SET LEADCHNL RV SENSING SENSITIVITY: 2 mV
MDC IDC STAT BRADY AP VP PERCENT: 47 %
MDC IDC STAT BRADY AS VS PERCENT: 1 %
MDC IDC STAT BRADY RV PERCENT PACED: 99 %

## 2018-01-30 ENCOUNTER — Inpatient Hospital Stay (HOSPITAL_COMMUNITY): Payer: Medicare Other

## 2018-01-30 ENCOUNTER — Inpatient Hospital Stay (HOSPITAL_COMMUNITY)
Admission: EM | Admit: 2018-01-30 | Discharge: 2018-02-01 | DRG: 194 | Disposition: A | Discharge status: Home Health | Payer: Medicare Other | Attending: Internal Medicine | Admitting: Internal Medicine

## 2018-01-30 ENCOUNTER — Emergency Department (HOSPITAL_COMMUNITY): Payer: Medicare Other

## 2018-01-30 ENCOUNTER — Encounter (HOSPITAL_COMMUNITY): Payer: Self-pay

## 2018-01-30 DIAGNOSIS — I442 Atrioventricular block, complete: Secondary | ICD-10-CM | POA: Diagnosis not present

## 2018-01-30 DIAGNOSIS — G20 Parkinson's disease: Secondary | ICD-10-CM | POA: Diagnosis not present

## 2018-01-30 DIAGNOSIS — I10 Essential (primary) hypertension: Secondary | ICD-10-CM | POA: Diagnosis not present

## 2018-01-30 DIAGNOSIS — R29898 Other symptoms and signs involving the musculoskeletal system: Secondary | ICD-10-CM

## 2018-01-30 DIAGNOSIS — I119 Hypertensive heart disease without heart failure: Secondary | ICD-10-CM | POA: Diagnosis not present

## 2018-01-30 DIAGNOSIS — I480 Paroxysmal atrial fibrillation: Secondary | ICD-10-CM | POA: Diagnosis not present

## 2018-01-30 DIAGNOSIS — R531 Weakness: Secondary | ICD-10-CM | POA: Diagnosis not present

## 2018-01-30 DIAGNOSIS — D649 Anemia, unspecified: Secondary | ICD-10-CM | POA: Diagnosis present

## 2018-01-30 DIAGNOSIS — M1990 Unspecified osteoarthritis, unspecified site: Secondary | ICD-10-CM | POA: Diagnosis present

## 2018-01-30 DIAGNOSIS — J159 Unspecified bacterial pneumonia: Principal | ICD-10-CM | POA: Diagnosis present

## 2018-01-30 DIAGNOSIS — M6281 Muscle weakness (generalized): Secondary | ICD-10-CM | POA: Diagnosis not present

## 2018-01-30 DIAGNOSIS — Z90710 Acquired absence of both cervix and uterus: Secondary | ICD-10-CM

## 2018-01-30 DIAGNOSIS — Z79899 Other long term (current) drug therapy: Secondary | ICD-10-CM

## 2018-01-30 DIAGNOSIS — J189 Pneumonia, unspecified organism: Secondary | ICD-10-CM | POA: Diagnosis present

## 2018-01-30 DIAGNOSIS — Z96652 Presence of left artificial knee joint: Secondary | ICD-10-CM | POA: Diagnosis present

## 2018-01-30 DIAGNOSIS — J439 Emphysema, unspecified: Secondary | ICD-10-CM | POA: Diagnosis not present

## 2018-01-30 DIAGNOSIS — R778 Other specified abnormalities of plasma proteins: Secondary | ICD-10-CM

## 2018-01-30 DIAGNOSIS — Z7982 Long term (current) use of aspirin: Secondary | ICD-10-CM

## 2018-01-30 DIAGNOSIS — R29818 Other symptoms and signs involving the nervous system: Secondary | ICD-10-CM | POA: Diagnosis not present

## 2018-01-30 DIAGNOSIS — I739 Peripheral vascular disease, unspecified: Secondary | ICD-10-CM | POA: Diagnosis not present

## 2018-01-30 DIAGNOSIS — E785 Hyperlipidemia, unspecified: Secondary | ICD-10-CM | POA: Diagnosis not present

## 2018-01-30 DIAGNOSIS — N289 Disorder of kidney and ureter, unspecified: Secondary | ICD-10-CM | POA: Diagnosis not present

## 2018-01-30 DIAGNOSIS — R7989 Other specified abnormal findings of blood chemistry: Secondary | ICD-10-CM

## 2018-01-30 DIAGNOSIS — Z950 Presence of cardiac pacemaker: Secondary | ICD-10-CM

## 2018-01-30 DIAGNOSIS — J181 Lobar pneumonia, unspecified organism: Secondary | ICD-10-CM

## 2018-01-30 DIAGNOSIS — I6523 Occlusion and stenosis of bilateral carotid arteries: Secondary | ICD-10-CM | POA: Diagnosis not present

## 2018-01-30 LAB — COMPREHENSIVE METABOLIC PANEL
ALT: 6 U/L (ref 0–44)
AST: 19 U/L (ref 15–41)
Albumin: 3.3 g/dL — ABNORMAL LOW (ref 3.5–5.0)
Alkaline Phosphatase: 53 U/L (ref 38–126)
Anion gap: 9 (ref 5–15)
BUN: 11 mg/dL (ref 8–23)
CO2: 28 mmol/L (ref 22–32)
Calcium: 9.1 mg/dL (ref 8.9–10.3)
Chloride: 96 mmol/L — ABNORMAL LOW (ref 98–111)
Creatinine, Ser: 1.18 mg/dL — ABNORMAL HIGH (ref 0.44–1.00)
GFR calc Af Amer: 47 mL/min — ABNORMAL LOW (ref >60)
GFR calc non Af Amer: 41 mL/min — ABNORMAL LOW (ref >60)
Glucose, Bld: 99 mg/dL (ref 70–99)
POTASSIUM: 4.2 mmol/L (ref 3.5–5.1)
Sodium: 133 mmol/L — ABNORMAL LOW (ref 135–145)
Total Bilirubin: 0.6 mg/dL (ref 0.3–1.2)
Total Protein: 8.2 g/dL — ABNORMAL HIGH (ref 6.5–8.1)

## 2018-01-30 LAB — TROPONIN I: TROPONIN I: 0.12 ng/mL — AB (ref <0.03)

## 2018-01-30 LAB — CBC WITH DIFFERENTIAL/PLATELET
Abs Immature Granulocytes: 0.02 10*3/uL (ref 0.00–0.07)
BASOS PCT: 1 %
Basophils Absolute: 0 10*3/uL (ref 0.0–0.1)
Eosinophils Absolute: 0.1 10*3/uL (ref 0.0–0.5)
Eosinophils Relative: 3 %
HCT: 32 % — ABNORMAL LOW (ref 36.0–46.0)
Hemoglobin: 9.7 g/dL — ABNORMAL LOW (ref 12.0–15.0)
Immature Granulocytes: 1 %
Lymphocytes Relative: 14 %
Lymphs Abs: 0.6 10*3/uL — ABNORMAL LOW (ref 0.7–4.0)
MCH: 29 pg (ref 26.0–34.0)
MCHC: 30.3 g/dL (ref 30.0–36.0)
MCV: 95.8 fL (ref 80.0–100.0)
Monocytes Absolute: 0.7 10*3/uL (ref 0.1–1.0)
Monocytes Relative: 17 %
Neutro Abs: 2.8 10*3/uL (ref 1.7–7.7)
Neutrophils Relative %: 64 %
Platelets: 165 10*3/uL (ref 150–400)
RBC: 3.34 MIL/uL — AB (ref 3.87–5.11)
RDW: 12.7 % (ref 11.5–15.5)
WBC: 4.3 10*3/uL (ref 4.0–10.5)
nRBC: 0 % (ref 0.0–0.2)

## 2018-01-30 LAB — BRAIN NATRIURETIC PEPTIDE: B Natriuretic Peptide: 978 pg/mL — ABNORMAL HIGH (ref 0.0–100.0)

## 2018-01-30 MED ORDER — SODIUM CHLORIDE (PF) 0.9 % IJ SOLN
INTRAMUSCULAR | Status: AC
  Filled 2018-01-30: qty 50

## 2018-01-30 MED ORDER — IOPAMIDOL (ISOVUE-370) INJECTION 76%
INTRAVENOUS | Status: AC
  Filled 2018-01-30: qty 100

## 2018-01-30 MED ORDER — LEVOFLOXACIN IN D5W 500 MG/100ML IV SOLN
500.0000 mg | Freq: Once | INTRAVENOUS | Status: AC
  Administered 2018-01-30: 500 mg via INTRAVENOUS
  Filled 2018-01-30: qty 100

## 2018-01-30 MED ORDER — IOPAMIDOL (ISOVUE-370) INJECTION 76%
100.0000 mL | Freq: Once | INTRAVENOUS | Status: AC | PRN
  Administered 2018-01-30: 100 mL via INTRAVENOUS

## 2018-01-30 NOTE — ED Notes (Signed)
Pt tried to used bedpan but was unsuccessful.  She feels like it going to come down and then it goes back up.

## 2018-01-30 NOTE — ED Notes (Signed)
Date and time results received: 01/30/18 2103  (use smartphrase ".now" to insert current time)  Test: Troponin Critical Value: 0.12  Name of Provider Notified: Ranae PalmsYelverton  Orders Received? Or Actions Taken?: Orders Received - See Orders for details

## 2018-01-30 NOTE — ED Notes (Signed)
Pt is alert and oriented x 4 and is verbally responsive. Pt denies pain at this time. Pt dtr is at bedside.

## 2018-01-30 NOTE — ED Provider Notes (Signed)
[facility]-EMERGENCY DEPT Provider Note   CSN: [id number] Arrival date & time: 01/30/18  1811     History   Chief Complaint Chief Complaint  Patient presents with  . Weakness    HPI [name] is a 84 y.o. female.  HPI Patient states that she developed weakness this morning around 1030.  States she was unable to ambulate and had to have assistance to get back to her bed.  She complains of bilateral lower extremity weakness.  She woke at 930 this morning and says she was in her normal state of health.  She has had urinary frequency and cough.  No fever or chills.  Denies chest pain or abdominal pain.  No vomiting or diarrhea.  No visual changes or speech changes. Past Medical History:  Diagnosis Date  . Arthritis   . High cholesterol   . Hypertension   . Paroxysmal atrial fibrillation (HCC)    a. identified on device interrogation 03/2017, all episodes to date <1 minute  . S/P hysterectomy   . Second degree AV block    a. s/p STJ dual chamber PPM Dr Johney Frame  . Syncope     Patient Active Problem List   Diagnosis Date Noted  . CAP (community acquired pneumonia) 01/30/2018  . Near syncope 01/30/2017  . Mild renal insufficiency 01/30/2017  . Hyponatremia 01/30/2017  . Acute colitis 01/30/2017  . Orthostasis 01/30/2017  . Adnexal cyst 01/30/2017  . Bradycardia 11/21/2015  . Complete heart block (HCC) 11/21/2015  . Peripheral vascular disease (HCC) 11/21/2015  . Anemia 11/21/2015  . Hyperlipidemia 07/15/2008  . Hypertensive heart disease     Past Surgical History:  Procedure Laterality Date  . ABDOMINAL HYSTERECTOMY    . EP IMPLANTABLE DEVICE N/A 11/23/2015   SJM Assurity DR pacemaker implanted by Dr Johney Frame for second degree AV block with presyncope  . LOOP RECORDER EXPLANT N/A 03/13/2012   Procedure: LOOP RECORDER EXPLANT;  Surgeon: Marinus Maw, MD;  Location: [facility] CATH LAB;  Service: Cardiovascular;  Laterality: N/A;  . REPLACEMENT TOTAL  KNEE Left      OB History    Gravida  0   Para  0   Term  0   Preterm  0   AB  0   Living        SAB  0   TAB  0   Ectopic  0   Multiple      Live Births               Home Medications    Prior to Admission medications   Medication Sig Start Date End Date Taking? Authorizing Provider  aspirin EC 81 MG tablet Take 81 mg by mouth every evening.    Yes [provider]  calcium-vitamin D (OSCAL WITH D) 500-200 MG-UNIT per tablet Take 1 tablet by mouth 2 (two) times daily.   Yes [provider]  carbidopa-levodopa (SINEMET IR) 10-100 MG tablet Take 1 tablet by mouth 2 (two) times daily. 01/02/17  Yes [provider]  ergocalciferol (VITAMIN D2) 50000 UNITS capsule Take 50,000 Units by mouth every 14 (fourteen) days.   Yes [provider]  losartan-hydrochlorothiazide (HYZAAR) 50-12.5 MG tablet Take 1 tablet by mouth daily with breakfast.  07/21/15  Yes [provider]  NIFEdipine (PROCARDIA-XL/ADALAT-CC/NIFEDICAL-XL) 30 MG 24 hr tablet TAKE 1 TABLET(30 MG) BY MOUTH DAILY Patient taking differently: Take 30 mg by mouth daily.  04/24/17  Yes Hillis Range, MD  simvastatin (ZOCOR) 20 MG tablet Take 20 mg by mouth at bedtime.  08/02/14  Yes [provider]  vitamin B-12 (CYANOCOBALAMIN) 500 MCG tablet Take 500 mcg by mouth daily with breakfast.   Yes [provider]  docusate sodium (COLACE) 100 MG capsule Take 100 mg by mouth daily as needed for mild constipation.    [provider]  Polyethyl Glycol-Propyl Glycol (SYSTANE) 0.4-0.3 % SOLN Place 1 drop into both eyes 2 (two) times daily.    [provider]    Family History Family History  Problem Relation Age of Onset  . Diabetes Other     Social History Social History   Tobacco Use  . Smoking status: Never Smoker  . Smokeless tobacco: Never Used  Substance Use Topics  . Alcohol use: No    Alcohol/week: 0.0 standard drinks  . Drug use:  No     Allergies   Patient has no known allergies.   Review of Systems Review of Systems  Constitutional: Negative for chills and fever.  HENT: Negative for trouble swallowing.   Eyes: Negative for visual disturbance.  Respiratory: Positive for cough. Negative for shortness of breath.   Cardiovascular: Positive for leg swelling. Negative for chest pain and palpitations.  Gastrointestinal: Negative for abdominal pain, blood in stool, constipation, diarrhea and vomiting.  Genitourinary: Positive for frequency. Negative for dysuria, flank pain and hematuria.  Musculoskeletal: Negative for back pain, myalgias and neck pain.  Skin: Negative for rash and wound.  Neurological: Positive for weakness. Negative for dizziness, speech difficulty, light-headedness, numbness and headaches.  All other systems reviewed and are negative.    Physical Exam Updated Vital Signs BP (!) 161/63   Pulse 61   Temp 99.1 F (37.3 C) (Oral)   Resp 18   Ht 5\' 3"  (1.6 m)   Wt 68 kg   SpO2 100%   BMI 26.57 kg/m   Physical Exam  Constitutional: She is oriented to person, place, and time. She appears well-developed and well-nourished. No distress.  HENT:  Head: Normocephalic and atraumatic.  Mouth/Throat: Oropharynx is clear and moist.  No obvious head injuries.  No intraoral trauma.  Cranial nerves II through XII grossly intact.  Eyes: Pupils are equal, round, and reactive to light. EOM are normal.  Neck: Normal range of motion. Neck supple.  No posterior midline cervical tenderness to palpation.  No meningismus  Cardiovascular: Normal rate and regular rhythm. Exam reveals no gallop and no friction rub.  No murmur heard. Pulmonary/Chest: Effort normal and breath sounds normal. No stridor. No respiratory distress. She has no wheezes. She has no rales. She exhibits no tenderness.  Abdominal: Soft. Bowel sounds are normal. There is no tenderness. There is no rebound and no guarding.  Musculoskeletal:  Normal range of motion. She exhibits edema. She exhibits no tenderness.  1+ bilateral lower extremity pitting edema.  Distal pulses intact.  Neurological: She is alert and oriented to person, place, and time.  Speech is slightly hard to understand the patient does not have her dentures in.  Patient has 5/5 motor bilateral upper extremities and 4/5 left lower extremity with 3/5 right lower extremity strength. Sensation is intact to light touch. Bilateral finger-to-nose is normal with no signs of dysmetria.  Skin: Skin is warm and dry. Capillary refill takes less than 2 seconds. No rash noted. She is not diaphoretic. No erythema.  Psychiatric: She has a normal mood and affect. Her behavior is normal.  Nursing note and vitals reviewed.  ED Treatments / Results  Labs (all labs ordered are listed, but only abnormal results are displayed) Labs Reviewed  CBC WITH DIFFERENTIAL/PLATELET - Abnormal; Notable for the following components:      Result Value   RBC 3.34 (*)    Hemoglobin 9.7 (*)    HCT 32.0 (*)    Lymphs Abs 0.6 (*)    All other components within normal limits  COMPREHENSIVE METABOLIC PANEL - Abnormal; Notable for the following components:   Sodium 133 (*)    Chloride 96 (*)    Creatinine, Ser 1.18 (*)    Total Protein 8.2 (*)    Albumin 3.3 (*)    GFR calc non Af Amer 41 (*)    GFR calc Af Amer 47 (*)    All other components within normal limits  BRAIN NATRIURETIC PEPTIDE - Abnormal; Notable for the following components:   B Natriuretic Peptide 978.0 (*)    All other components within normal limits  TROPONIN I - Abnormal; Notable for the following components:   Troponin I 0.12 (*)    All other components within normal limits  URINALYSIS, ROUTINE W REFLEX MICROSCOPIC    EKG EKG Interpretation  Date/Time:  Friday January 30 2018 19:59:48 EST Ventricular Rate:  66 PR Interval:    QRS Duration: 152 QT Interval:  435 QTC Calculation: 456 R Axis:   -78 Text  Interpretation:  Atrial-sensed ventricular-paced rhythm No further analysis attempted due to paced rhythm Confirmed by Loren Racer ([id number]) on 01/30/2018 9:21:11 PM   Radiology Ct Head Wo Contrast  Result Date: 01/30/2018 CLINICAL DATA:  Pt BIB [facility] from home. Pt had sudden onset weakness while walking back to her room from her bathroom. Pt has bilateral edema she stated that her right is normally edematous but the left leg is new. Pt is not able to walk current.*comment was truncated*Focal neuro deficit, > 6 hrs, stroke suspected EXAM: CT HEAD WITHOUT CONTRAST TECHNIQUE: Contiguous axial images were obtained from the base of the skull through the vertex without intravenous contrast. COMPARISON:  None. FINDINGS: Brain: No acute intracranial hemorrhage. No focal mass lesion. No CT evidence of acute infarction. No midline shift or mass effect. No hydrocephalus. Basilar cisterns are patent. Minimal periventricular white matter hypodensities. Vascular: No hyperdense vessel or unexpected calcification. Skull: Normal. Negative for fracture or focal lesion. Sinuses/Orbits: Paranasal sinuses and mastoid air cells are clear. Orbits are clear. Other: None. IMPRESSION: Normal head CT for age. Electronically Signed   By: Genevive Bi M.D.   On: 01/30/2018 20:07   Dg Chest Port 1 View  Result Date: 01/30/2018 CLINICAL DATA:  Acute onset weakness. History of hypertension, atrial fibrillation. EXAM: PORTABLE CHEST 1 VIEW COMPARISON:  Chest radiograph of November 24, 2015 FINDINGS: Cardiac silhouette is mildly enlarged and unchanged. Calcified aortic arch. Reticulonodular densities increased from prior examination with blunting of the RIGHT costophrenic angle. RIGHT middle lobe airspace opacity. Increased lung volumes. No pneumothorax. Dual lead LEFT cardiac pacemaker in situ. Soft tissue planes included osseous structures are non suspicious. IMPRESSION: 1. RIGHT middle lobe airspace opacity and increased  reticulonodular densities concerning for pneumonia. Followup PA and lateral chest X-ray is recommended in 3-4 weeks following trial of antibiotic therapy to ensure resolution and exclude underlying malignancy. 2. Mild cardiomegaly. 3.  Emphysema (ICD10-J43.9).  Aortic Atherosclerosis (ICD10-I70.0). Electronically Signed   By: Awilda Metro M.D.   On: 01/30/2018 19:59    Procedures Procedures (including critical care time)  Medications Ordered in ED Medications  levofloxacin (LEVAQUIN) IVPB 500 mg (has no administration in time range)     Initial Impression / Assessment and Plan / ED Course  I have reviewed the triage vital signs and the nursing notes.  Pertinent labs & imaging results that were available during my care of the patient were reviewed by me and considered in my medical decision making (see chart for details).    Evidence of right middle lobe pneumonia on x-ray.  CT head without acute findings.  Patient does have mild elevation in troponin.  Continues to deny any chest pain.  CT head without acute findings.  Discussed with hospitalist.  Will get CT angio head and neck and start antibiotics for community acquired pneumonia.  Final Clinical Impressions(s) / ED Diagnoses   Final diagnoses:  Community acquired pneumonia of right middle lobe of lung (HCC)  Weakness of both legs  Elevated troponin    ED Discharge Orders    None       Loren RacerYelverton, Corbitt Cloke, MD 01/30/18 2222

## 2018-01-30 NOTE — ED Triage Notes (Signed)
Pt BIB [facility] from home. Pt had sudden onset weakness while walking back to her room from her bathroom. Pt has bilateral edema she stated that her right is normally edematous but the left leg is new. Pt is not able to walk currently. No other signs other then she has been peeing more often then normal.

## 2018-01-31 ENCOUNTER — Encounter (HOSPITAL_COMMUNITY): Payer: Self-pay | Admitting: Internal Medicine

## 2018-01-31 ENCOUNTER — Inpatient Hospital Stay (HOSPITAL_COMMUNITY): Payer: Medicare Other

## 2018-01-31 ENCOUNTER — Other Ambulatory Visit: Payer: Self-pay

## 2018-01-31 DIAGNOSIS — I119 Hypertensive heart disease without heart failure: Secondary | ICD-10-CM | POA: Diagnosis not present

## 2018-01-31 DIAGNOSIS — J181 Lobar pneumonia, unspecified organism: Secondary | ICD-10-CM | POA: Diagnosis not present

## 2018-01-31 DIAGNOSIS — I442 Atrioventricular block, complete: Secondary | ICD-10-CM | POA: Diagnosis not present

## 2018-01-31 DIAGNOSIS — G20 Parkinson's disease: Secondary | ICD-10-CM | POA: Diagnosis present

## 2018-01-31 DIAGNOSIS — I480 Paroxysmal atrial fibrillation: Secondary | ICD-10-CM | POA: Diagnosis not present

## 2018-01-31 DIAGNOSIS — R7989 Other specified abnormal findings of blood chemistry: Secondary | ICD-10-CM

## 2018-01-31 DIAGNOSIS — I340 Nonrheumatic mitral (valve) insufficiency: Secondary | ICD-10-CM

## 2018-01-31 DIAGNOSIS — I739 Peripheral vascular disease, unspecified: Secondary | ICD-10-CM | POA: Diagnosis not present

## 2018-01-31 DIAGNOSIS — J159 Unspecified bacterial pneumonia: Secondary | ICD-10-CM | POA: Diagnosis not present

## 2018-01-31 DIAGNOSIS — R29898 Other symptoms and signs involving the musculoskeletal system: Secondary | ICD-10-CM

## 2018-01-31 DIAGNOSIS — I10 Essential (primary) hypertension: Secondary | ICD-10-CM | POA: Diagnosis present

## 2018-01-31 DIAGNOSIS — J189 Pneumonia, unspecified organism: Secondary | ICD-10-CM | POA: Diagnosis present

## 2018-01-31 DIAGNOSIS — N289 Disorder of kidney and ureter, unspecified: Secondary | ICD-10-CM | POA: Diagnosis not present

## 2018-01-31 DIAGNOSIS — R778 Other specified abnormalities of plasma proteins: Secondary | ICD-10-CM

## 2018-01-31 DIAGNOSIS — I361 Nonrheumatic tricuspid (valve) insufficiency: Secondary | ICD-10-CM

## 2018-01-31 DIAGNOSIS — E785 Hyperlipidemia, unspecified: Secondary | ICD-10-CM | POA: Diagnosis not present

## 2018-01-31 DIAGNOSIS — R531 Weakness: Secondary | ICD-10-CM | POA: Diagnosis not present

## 2018-01-31 DIAGNOSIS — D649 Anemia, unspecified: Secondary | ICD-10-CM | POA: Diagnosis not present

## 2018-01-31 LAB — CBC
HCT: 27 % — ABNORMAL LOW (ref 36.0–46.0)
Hemoglobin: 8.3 g/dL — ABNORMAL LOW (ref 12.0–15.0)
MCH: 29.3 pg (ref 26.0–34.0)
MCHC: 30.7 g/dL (ref 30.0–36.0)
MCV: 95.4 fL (ref 80.0–100.0)
Platelets: 157 10*3/uL (ref 150–400)
RBC: 2.83 MIL/uL — AB (ref 3.87–5.11)
RDW: 12.8 % (ref 11.5–15.5)
WBC: 3.3 10*3/uL — ABNORMAL LOW (ref 4.0–10.5)
nRBC: 0 % (ref 0.0–0.2)

## 2018-01-31 LAB — URINALYSIS, ROUTINE W REFLEX MICROSCOPIC
Bacteria, UA: NONE SEEN
Bilirubin Urine: NEGATIVE
Glucose, UA: NEGATIVE mg/dL
Ketones, ur: NEGATIVE mg/dL
Leukocytes, UA: NEGATIVE
Nitrite: NEGATIVE
PH: 8 (ref 5.0–8.0)
Protein, ur: NEGATIVE mg/dL
SPECIFIC GRAVITY, URINE: 1.016 (ref 1.005–1.030)

## 2018-01-31 LAB — EXPECTORATED SPUTUM ASSESSMENT W GRAM STAIN, RFLX TO RESP C

## 2018-01-31 LAB — CK: CK TOTAL: 97 U/L (ref 38–234)

## 2018-01-31 LAB — HIV ANTIBODY (ROUTINE TESTING W REFLEX): HIV Screen 4th Generation wRfx: NONREACTIVE

## 2018-01-31 LAB — BASIC METABOLIC PANEL
Anion gap: 10 (ref 5–15)
BUN: 11 mg/dL (ref 8–23)
CALCIUM: 8.6 mg/dL — AB (ref 8.9–10.3)
CHLORIDE: 96 mmol/L — AB (ref 98–111)
CO2: 27 mmol/L (ref 22–32)
CREATININE: 1.1 mg/dL — AB (ref 0.44–1.00)
GFR calc Af Amer: 52 mL/min — ABNORMAL LOW (ref >60)
GFR calc non Af Amer: 44 mL/min — ABNORMAL LOW (ref >60)
Glucose, Bld: 81 mg/dL (ref 70–99)
Potassium: 4.2 mmol/L (ref 3.5–5.1)
Sodium: 133 mmol/L — ABNORMAL LOW (ref 135–145)

## 2018-01-31 LAB — TSH: TSH: 1.557 u[IU]/mL (ref 0.350–4.500)

## 2018-01-31 LAB — TROPONIN I
Troponin I: 0.11 ng/mL (ref <0.03)
Troponin I: 0.09 ng/mL (ref <0.03)
Troponin I: 0.06 ng/mL (ref <0.03)

## 2018-01-31 LAB — INFLUENZA PANEL BY PCR (TYPE A & B)
Influenza A By PCR: NEGATIVE
Influenza B By PCR: NEGATIVE

## 2018-01-31 LAB — ECHOCARDIOGRAM COMPLETE
Height: 63 in
Weight: 2465.62 oz

## 2018-01-31 LAB — EXPECTORATED SPUTUM ASSESSMENT W REFEX TO RESP CULTURE

## 2018-01-31 LAB — STREP PNEUMONIAE URINARY ANTIGEN: Strep Pneumo Urinary Antigen: NEGATIVE

## 2018-01-31 MED ORDER — HYDROCHLOROTHIAZIDE 12.5 MG PO CAPS
12.5000 mg | ORAL_CAPSULE | Freq: Every day | ORAL | Status: DC
  Administered 2018-01-31 – 2018-02-01 (×2): 12.5 mg via ORAL
  Filled 2018-01-31 (×2): qty 1

## 2018-01-31 MED ORDER — ONDANSETRON HCL 4 MG PO TABS: 4.0000 mg | ORAL_TABLET | Freq: Four times a day (QID) | ORAL | Status: DC | PRN

## 2018-01-31 MED ORDER — ACETAMINOPHEN 325 MG PO TABS: 650.0000 mg | ORAL_TABLET | Freq: Four times a day (QID) | ORAL | Status: DC | PRN

## 2018-01-31 MED ORDER — GLYCERIN-HYPROMELLOSE-PEG 400 0.2-0.2-1 % OP SOLN
1.0000 [drp] | Freq: Two times a day (BID) | OPHTHALMIC | Status: DC
  Administered 2018-01-31 – 2018-02-01 (×3): 1 [drp] via OPHTHALMIC
  Filled 2018-01-31: qty 15

## 2018-01-31 MED ORDER — ONDANSETRON HCL 4 MG/2ML IJ SOLN: 4.0000 mg | Freq: Four times a day (QID) | INTRAMUSCULAR | Status: DC | PRN

## 2018-01-31 MED ORDER — VITAMIN D (ERGOCALCIFEROL) 1.25 MG (50000 UNIT) PO CAPS
50000.0000 [IU] | ORAL_CAPSULE | ORAL | Status: DC
  Administered 2018-01-31: 50000 [IU] via ORAL
  Filled 2018-01-31: qty 1

## 2018-01-31 MED ORDER — POLYVINYL ALCOHOL 1.4 % OP SOLN
1.0000 [drp] | Freq: Two times a day (BID) | OPHTHALMIC | Status: DC
  Filled 2018-01-31: qty 15

## 2018-01-31 MED ORDER — ACETAMINOPHEN 325 MG PO TABS
650.0000 mg | ORAL_TABLET | Freq: Four times a day (QID) | ORAL | Status: DC | PRN
  Administered 2018-01-31: 650 mg via ORAL
  Filled 2018-01-31: qty 2

## 2018-01-31 MED ORDER — VITAMIN B-12 1000 MCG PO TABS
500.0000 ug | ORAL_TABLET | Freq: Every day | ORAL | Status: DC
  Administered 2018-01-31 – 2018-02-01 (×2): 500 ug via ORAL
  Filled 2018-01-31 (×2): qty 1

## 2018-01-31 MED ORDER — ASPIRIN EC 81 MG PO TBEC: 81.0000 mg | DELAYED_RELEASE_TABLET | Freq: Every evening | ORAL | Status: DC

## 2018-01-31 MED ORDER — CARBIDOPA-LEVODOPA 10-100 MG PO TABS
1.0000 | ORAL_TABLET | Freq: Two times a day (BID) | ORAL | Status: DC
  Administered 2018-01-31 – 2018-02-01 (×3): 1 via ORAL
  Filled 2018-01-31 (×3): qty 1

## 2018-01-31 MED ORDER — LEVOFLOXACIN IN D5W 750 MG/150ML IV SOLN: 750.0000 mg | INTRAVENOUS | Status: DC

## 2018-01-31 MED ORDER — SODIUM CHLORIDE 0.9 % IV SOLN
1.0000 g | INTRAVENOUS | Status: DC
  Administered 2018-01-31: 1 g via INTRAVENOUS
  Filled 2018-01-31: qty 1
  Filled 2018-01-31: qty 10

## 2018-01-31 MED ORDER — SODIUM CHLORIDE 0.9 % IV SOLN
500.0000 mg | INTRAVENOUS | Status: DC
  Administered 2018-02-01: 500 mg via INTRAVENOUS
  Filled 2018-01-31 (×2): qty 500

## 2018-01-31 MED ORDER — NIFEDIPINE ER OSMOTIC RELEASE 30 MG PO TB24
30.0000 mg | ORAL_TABLET | Freq: Every day | ORAL | Status: DC
  Administered 2018-01-31 – 2018-02-01 (×2): 30 mg via ORAL
  Filled 2018-01-31 (×2): qty 1

## 2018-01-31 MED ORDER — SIMVASTATIN 20 MG PO TABS
20.0000 mg | ORAL_TABLET | Freq: Every day | ORAL | Status: DC
  Administered 2018-01-31: 20 mg via ORAL
  Filled 2018-01-31: qty 1

## 2018-01-31 MED ORDER — ACETAMINOPHEN 650 MG RE SUPP: 650.0000 mg | Freq: Four times a day (QID) | RECTAL | Status: DC | PRN

## 2018-01-31 MED ORDER — LOSARTAN POTASSIUM 50 MG PO TABS
50.0000 mg | ORAL_TABLET | Freq: Every day | ORAL | Status: DC
  Administered 2018-01-31 – 2018-02-01 (×2): 50 mg via ORAL
  Filled 2018-01-31 (×2): qty 1

## 2018-01-31 MED ORDER — DOCUSATE SODIUM 100 MG PO CAPS: 100.0000 mg | ORAL_CAPSULE | Freq: Every day | ORAL | Status: DC | PRN

## 2018-01-31 MED ORDER — POLYVINYL ALCOHOL 1.4 % OP SOLN
1.0000 [drp] | Freq: Two times a day (BID) | OPHTHALMIC | Status: DC
  Filled 2018-01-31 (×2): qty 15

## 2018-01-31 MED ORDER — CALCIUM CARBONATE-VITAMIN D 500-200 MG-UNIT PO TABS
1.0000 | ORAL_TABLET | Freq: Two times a day (BID) | ORAL | Status: DC
  Administered 2018-01-31 – 2018-02-01 (×3): 1 via ORAL
  Filled 2018-01-31 (×3): qty 1

## 2018-01-31 NOTE — Evaluation (Signed)
Physical Therapy Evaluation Patient Details Name: Robin SilversBernell G Kohen MRN: [id number] DOB: 1926-06-12 Today's Date: 01/31/2018   History of Present Illness  84 y.o. female with history of heart block status post pacemaker placement, hypertension, chronic anemia, Parkinson's disease presents to the ER the patient suddenly developed weakness of the both lower extremities.  Pt admitted for work of lower extremity weakness and pneumonia.  Clinical Impression  Pt admitted with above diagnosis. Pt currently with functional limitations due to the deficits listed below (see PT Problem List).  Pt will benefit from skilled PT to increase their independence and safety with mobility to allow discharge to the venue listed below.   Pt reports improved ability to move R LE this afternoon.  [facility] and then ambulated into hallway short distance.  No significant difference in strength in LEs found with testing or observed with mobility.  Pt plans to return home and has family assist.  Granddaughter present for session and agreeable to HHPT and RW upon d/c.      Follow Up Recommendations Home health PT;Supervision for mobility/OOB    Equipment Recommendations  Rolling walker with 5" wheels    Recommendations for Other Services       Precautions / Restrictions Precautions Precautions: Fall      Mobility  Bed Mobility                  Transfers Overall transfer level: Needs assistance Equipment used: Rolling walker (2 wheeled) Transfers: Sit to/from Stand Sit to Stand: Min guard;Min assist         General transfer comment: assist for initial rise however min/guard thereafter  Ambulation/Gait Ambulation/Gait assistance: Min guard Gait Distance (Feet): 40 Feet Assistive device: Rolling walker (2 wheeled) Gait Pattern/deviations: Step-through pattern;Decreased stride length     General Gait Details: slow but steady pace with RW, pt denies any significant  change in weakness, reports feeling close to her baseline, however pt did fatigue quickly, denies SOB  Stairs            Wheelchair Mobility    Modified Rankin (Stroke Patients Only)       Balance Overall balance assessment: Mild deficits observed, not formally tested(steadiness improves with bil UE support, denies any falls)                                           Pertinent Vitals/Pain Pain Assessment: No/denies pain    Home Living Family/patient expects to be discharged to:: Private residence Living Arrangements: Children;Other relatives Available Help at Discharge: Family;Available 24 hours/day Type of Home: House Home Access: Stairs to enter Entrance Stairs-Rails: Right Entrance Stairs-Number of Steps: 1 Home Layout: One level Home Equipment: Cane - single point      Prior Function Level of Independence: Independent with assistive device(s)         Comments: uses a cane occasionally     Hand Dominance        Extremity/Trunk Assessment        Lower Extremity Assessment Lower Extremity Assessment: Generalized weakness;RLE deficits/detail;LLE deficits/detail(no significant difference in strength between extremities with testing) RLE Deficits / Details: grossly presents with bil hip weakness 3/5 throughout LLE Deficits / Details: bil edema present in lower legs - encouraged elevation        Communication   Communication: No difficulties  Cognition Arousal/Alertness: Awake/alert Behavior During Therapy:  WFL for tasks assessed/performed Overall Cognitive Status: Within Functional Limits for tasks assessed                                        General Comments      Exercises     Assessment/Plan    PT Assessment Patient needs continued PT services  PT Problem List Decreased balance;Decreased knowledge of use of DME;Decreased strength;Decreased mobility       PT Treatment Interventions Gait  training;Therapeutic exercise;Patient/family education;DME instruction;Therapeutic activities;Functional mobility training;Balance training    PT Goals (Current goals can be found in the Care Plan section)  Acute Rehab PT Goals Patient Stated Goal: home asap PT Goal Formulation: With patient/family Time For Goal Achievement: 02/07/18 Potential to Achieve Goals: Good    Frequency Min 3X/week   Barriers to discharge        Co-evaluation               AM-PAC PT "6 Clicks" Mobility  Outcome Measure Help needed turning from your back to your side while in a flat bed without using bedrails?: A Little Help needed moving from lying on your back to sitting on the side of a flat bed without using bedrails?: A Little Help needed moving to and from a bed to a chair (including a wheelchair)?: A Little Help needed standing up from a chair using your arms (e.g., wheelchair or bedside chair)?: A Little Help needed to walk in hospital room?: A Little Help needed climbing 3-5 steps with a railing? : A Lot 6 Click Score: 17    End of Session Equipment Utilized During Treatment: Gait belt Activity Tolerance: Patient tolerated treatment well Patient left: in bed;with call bell/phone within reach;with family/visitor present(sitting EOB for lunch with granddaughter)   PT Visit Diagnosis: Difficulty in walking, not elsewhere classified (R26.2);Muscle weakness (generalized) (M62.81)    Time: 1610-9604 PT Time Calculation (min) (ACUTE ONLY): 22 min   Charges:   PT Evaluation $PT Eval Low Complexity: 1 Low         Zenovia Jarred, PT, DPT Acute Rehabilitation Services Office: [phone] Pager: [phone] Sarajane Jews 01/31/2018, 3:50 PM

## 2018-01-31 NOTE — Progress Notes (Signed)
Pt incontinent of urine, unable to collect urinalysis.  Purewick placed.  Asked patient to call when she voids.

## 2018-01-31 NOTE — H&P (Addendum)
History and Physical    [name] ZOX:[id number] DOB: 10-25-1926 DOA: 01/30/2018  PCP: [name], MD  Patient coming from: Home.  Chief Complaint: Lower extremity weakness.  HPI: [name] is a 84 y.o. female with history of heart block status post pacemaker placement, hypertension, chronic anemia, Parkinson's disease presents to the ER the patient suddenly developed weakness of the both lower extremities around 4 PM yesterday.  Patient daughter states that she had come for pain bills 2 days previously.  Denies any weakness of the upper extremities.  Has been having productive cough for last 1 week.  Denies any incontinence of urine or bowel or any chest pain or shortness of breath.  Has not had any fever chills.  Patient denies any fall.  Denies any numbness or tingling.  ED Course: In the ER patient is found to have weakness of the lower extremity more on the right side than the left and left-sided weakness has improved.  Denies any weakness of the upper extremities.  CT head was followed by CT angiogram of the head and neck which did not show any large vessel occlusion or any infarcts.  Unable to do MRI due to pacemaker.  Chest x-ray shows features consistent for pneumonia and patient has benign productive cough.  BNP was 978 troponin was 0.1.  Review of Systems: As per HPI, rest all negative.   Past Medical History:  Diagnosis Date  . Arthritis   . High cholesterol   . Hypertension   . Paroxysmal atrial fibrillation (HCC)    a. identified on device interrogation 03/2017, all episodes to date <1 minute  . S/P hysterectomy   . Second degree AV block    a. s/p STJ dual chamber PPM Dr Johney Frame  . Syncope     Past Surgical History:  Procedure Laterality Date  . ABDOMINAL HYSTERECTOMY    . EP IMPLANTABLE DEVICE N/A 11/23/2015   SJM [employer] DR pacemaker implanted by Dr Johney Frame for second degree AV block with presyncope  . LOOP RECORDER EXPLANT N/A 03/13/2012   Procedure: LOOP RECORDER EXPLANT;  Surgeon: Marinus Maw, MD;  Location: [facility] CATH LAB;  Service: Cardiovascular;  Laterality: N/A;  . REPLACEMENT TOTAL KNEE Left      reports that she has never smoked. She has never used smokeless tobacco. She reports that she does not drink alcohol or use drugs.  No Known Allergies  Family History  Problem Relation Age of Onset  . Diabetes Other     Prior to Admission medications   Medication Sig Start Date End Date Taking? Authorizing Provider  aspirin EC 81 MG tablet Take 81 mg by mouth every evening.    Yes [provider]  calcium-vitamin D (OSCAL WITH D) 500-200 MG-UNIT per tablet Take 1 tablet by mouth 2 (two) times daily.   Yes [provider]  carbidopa-levodopa (SINEMET IR) 10-100 MG tablet Take 1 tablet by mouth 2 (two) times daily. 01/02/17  Yes [provider]  ergocalciferol (VITAMIN D2) 50000 UNITS capsule Take 50,000 Units by mouth every 14 (fourteen) days.   Yes [provider]  losartan-hydrochlorothiazide (HYZAAR) 50-12.5 MG tablet Take 1 tablet by mouth daily with breakfast.  07/21/15  Yes [provider]  NIFEdipine (PROCARDIA-XL/ADALAT-CC/NIFEDICAL-XL) 30 MG 24 hr tablet TAKE 1 TABLET(30 MG) BY MOUTH DAILY Patient taking differently: Take 30 mg by mouth daily.  04/24/17  Yes Allred, Fayrene Fearing, MD  simvastatin (ZOCOR) 20 MG tablet Take 20 mg by mouth at  bedtime.  08/02/14  Yes [provider]  vitamin B-12 (CYANOCOBALAMIN) 500 MCG tablet Take 500 mcg by mouth daily with breakfast.   Yes [provider]  docusate sodium (COLACE) 100 MG capsule Take 100 mg by mouth daily as needed for mild constipation.    [provider]  Polyethyl Glycol-Propyl Glycol (SYSTANE) 0.4-0.3 % SOLN Place 1 drop into both eyes 2 (two) times daily.    [provider]    Physical Exam: Vitals:   01/30/18 2130 01/31/18 0008 01/31/18 0124 01/31/18 0130  BP: (!) 161/63 (!) 161/59  (!)  162/89  Pulse: 61 70  66  Resp: 18 (!) 24  18  Temp:    98.8 F (37.1 C)  TempSrc:    Oral  SpO2: 100% 100%  97%  Weight:   69.9 kg   Height:   5\' 3"  (1.6 m)       Constitutional: Moderately built and nourished. Vitals:   01/30/18 2130 01/31/18 0008 01/31/18 0124 01/31/18 0130  BP: (!) 161/63 (!) 161/59  (!) 162/89  Pulse: 61 70  66  Resp: 18 (!) 24  18  Temp:    98.8 F (37.1 C)  TempSrc:    Oral  SpO2: 100% 100%  97%  Weight:   69.9 kg   Height:   5\' 3"  (1.6 m)    Eyes: Anicteric no pallor. ENMT: No discharge from the ears eyes nose or mouth. Neck: No mass felt.  No neck rigidity. Respiratory: No rhonchi or crepitations. Cardiovascular: S1-S2 heard no murmurs appreciated. Abdomen: Soft nontender bowel sounds present. Musculoskeletal: No edema.  No joint effusion. Skin: No rash. Neurologic: Alert awake oriented to time place and person.  Moves both upper extremities 5 x 5.  Left lower extremity is 4 x 5 in right lower extremity is 3 x 5.  No facial asymmetry tongue is midline. Psychiatric: Appears normal.   Labs on Admission: I have personally reviewed following labs and imaging studies  CBC: Recent Labs  Lab 01/30/18 2005  WBC 4.3  NEUTROABS 2.8  HGB 9.7*  HCT 32.0*  MCV 95.8  PLT 165   Basic Metabolic Panel: Recent Labs  Lab 01/30/18 2005  NA 133*  K 4.2  CL 96*  CO2 28  GLUCOSE 99  BUN 11  CREATININE 1.18*  CALCIUM 9.1   GFR: Estimated Creatinine Clearance: 30.3 mL/min (A) (by C-G formula based on SCr of 1.18 mg/dL (H)). Liver Function Tests: Recent Labs  Lab 01/30/18 2005  AST 19  ALT 6  ALKPHOS 53  BILITOT 0.6  PROT 8.2*  ALBUMIN 3.3*   No results for input(s): LIPASE, AMYLASE in the last 168 hours. No results for input(s): AMMONIA in the last 168 hours. Coagulation Profile: No results for input(s): INR, PROTIME in the last 168 hours. Cardiac Enzymes: Recent Labs  Lab 01/30/18 2005  TROPONINI 0.12*   BNP (last 3 results) No  results for input(s): PROBNP in the last 8760 hours. HbA1C: No results for input(s): HGBA1C in the last 72 hours. CBG: No results for input(s): GLUCAP in the last 168 hours. Lipid Profile: No results for input(s): CHOL, HDL, LDLCALC, TRIG, CHOLHDL, LDLDIRECT in the last 72 hours. Thyroid Function Tests: No results for input(s): TSH, T4TOTAL, FREET4, T3FREE, THYROIDAB in the last 72 hours. Anemia Panel: No results for input(s): VITAMINB12, FOLATE, FERRITIN, TIBC, IRON, RETICCTPCT in the last 72 hours. Urine analysis:    Component Value Date/Time   COLORURINE YELLOW 01/30/2018 2005  APPEARANCEUR CLEAR 01/30/2018 2005   LABSPEC 1.016 01/30/2018 2005   PHURINE 8.0 01/30/2018 2005   GLUCOSEU NEGATIVE 01/30/2018 2005   HGBUR MODERATE (A) 01/30/2018 2005   BILIRUBINUR NEGATIVE 01/30/2018 2005   KETONESUR NEGATIVE 01/30/2018 2005   PROTEINUR NEGATIVE 01/30/2018 2005   UROBILINOGEN 0.2 08/07/2014 1310   NITRITE NEGATIVE 01/30/2018 2005   LEUKOCYTESUR NEGATIVE 01/30/2018 2005   Sepsis Labs: @LABRCNTIP (procalcitonin:4,lacticidven:4) )No results found for this or any previous visit (from the past 240 hour(s)).   Radiological Exams on Admission: Ct Angio Head W Or Wo Contrast  Result Date: 01/30/2018 CLINICAL DATA:  Initial evaluation for acute sudden onset weakness. EXAM: CT ANGIOGRAPHY HEAD AND NECK TECHNIQUE: Multidetector CT imaging of the head and neck was performed using the standard protocol during bolus administration of intravenous contrast. Multiplanar CT image reconstructions and MIPs were obtained to evaluate the vascular anatomy. Carotid stenosis measurements (when applicable) are obtained utilizing NASCET criteria, using the distal internal carotid diameter as the denominator. CONTRAST:  ISOVUE-370 IOPAMIDOL (ISOVUE-370) INJECTION 76% COMPARISON:  Prior head CT from earlier the same day. FINDINGS: CTA NECK FINDINGS Aortic arch: Visualized aortic arch of normal caliber with  normal branch pattern. Extensive atherosclerotic change present within the visualized arch and about the origin of the great vessels. Associated stenosis of up to 40% at the origin of the left subclavian artery. Additional mild stenosis of approximately 20% at the proximal right subclavian artery just distal to the takeoff of the right vertebral artery. Visualized subclavian arteries otherwise widely patent. Right carotid system: Right common carotid artery tortuous proximally but widely patent to the bifurcation. Mixed plaque about the right bifurcation/proximal right ICA with associated stenosis of up to 45% by NASCET criteria. Right ICA widely patent distally to the skull base without stenosis, dissection, or occlusion. Left carotid system: Left common carotid artery tortuous proximally but widely patent to the bifurcation. Mixed plaque at the left bifurcation with associated of up to 40% by NASCET criteria. Left ICA otherwise patent distally to the skull base without stenosis, dissection, or occlusion. Vertebral arteries: Left vertebral artery arises separately from the aortic arch. Right vertebral artery rises from the right subclavian artery. Plaque at the origin of the vertebral arteries bilaterally with associated fairly severe stenosis of approximately 80% at the origin of the left vertebral artery. Associated stenosis of up to 70% at the origin of the right vertebral artery. The right vertebral artery is dominant. Vertebral arteries otherwise patent within the neck without stenosis, dissection, or occlusion. Skeleton: No acute osseous abnormality. No discrete lytic or blastic osseous lesions. Mild-to-moderate multilevel cervical spondylolysis, most notable at C5-6. Patient is edentulous. Other neck: No other acute soft tissue abnormality within the neck. Salivary glands within normal limits. Thyroid normal. No pathologically enlarged lymph nodes. Upper chest: Visualized upper chest demonstrates no acute  finding. Changes compatible with mild pulmonary interstitial edema present within the visualized lungs. Review of the MIP images confirms the above findings CTA HEAD FINDINGS Anterior circulation: Petrous segments patent bilaterally. Scattered calcified atheromatous plaque within the cavernous/supraclinoid ICAs bilaterally. Relatively mild multifocal narrowing on the left. There is focal moderate stenosis at the proximal cavernous right ICA (series 7, image 115). ICA termini well perfused bilaterally. A1 segments patent bilaterally. Right A1 hypoplastic. Subtle 2 mm focal outpouching arising from the anterior communicating artery suspicious for a tiny aneurysm (series 8, image 113). Anterior cerebral arteries patent to their distal aspects without flow-limiting stenosis. M1 segments patent bilaterally without stenosis. Normal MCA bifurcations.  Distal MCA branches well perfused and symmetric. Posterior circulation: Multifocal calcified plaque within the dominant right V4 segment with mild segmental tandem stenoses. Mild non stenotic plaque within the hypoplastic left vertebral artery without significant stenosis. Posterior inferior cerebral arteries patent bilaterally. Basilar mildly tortuous with short fenestration at its mid-distal aspect. No focal stenosis seen involving the basilar. Superior cerebral arteries irregular but widely patent bilaterally. Both of the posterior cerebral arteries primarily supplied via the basilar and are well perfused to their distal aspects without stenosis. Small bilateral posterior communicating arteries noted. Venous sinuses: Patent. Anatomic variants: None significant. Delayed phase: No abnormal enhancement. Review of the MIP images confirms the above findings IMPRESSION: 1. Negative CTA for emergent large vessel occlusion. 2. Atheromatous plaque at the origin of the vertebral arteries bilaterally with severe approximate 80% stenosis on the left and 70% on the right. Additional  short-segment mild tandem right V4 stenoses. Vertebrobasilar system otherwise widely patent. 3. Atheromatous stenoses about the carotid bifurcations measuring up to 45% on the right and 40% on the left. 4. 2 mm focal outpouching arising from the anterior communicating artery, suspicious for a small aneurysm. 5. Mild diffuse interlobular septal thickening within the visualized lungs, suggesting mild pulmonary interstitial edema. Electronically Signed   By: Rise Mu M.D.   On: 01/30/2018 23:42   Ct Head Wo Contrast  Result Date: 01/30/2018 CLINICAL DATA:  Pt BIB [facility] from home. Pt had sudden onset weakness while walking back to her room from her bathroom. Pt has bilateral edema she stated that her right is normally edematous but the left leg is new. Pt is not able to walk current.*comment was truncated*Focal neuro deficit, > 6 hrs, stroke suspected EXAM: CT HEAD WITHOUT CONTRAST TECHNIQUE: Contiguous axial images were obtained from the base of the skull through the vertex without intravenous contrast. COMPARISON:  None. FINDINGS: Brain: No acute intracranial hemorrhage. No focal mass lesion. No CT evidence of acute infarction. No midline shift or mass effect. No hydrocephalus. Basilar cisterns are patent. Minimal periventricular white matter hypodensities. Vascular: No hyperdense vessel or unexpected calcification. Skull: Normal. Negative for fracture or focal lesion. Sinuses/Orbits: Paranasal sinuses and mastoid air cells are clear. Orbits are clear. Other: None. IMPRESSION: Normal head CT for age. Electronically Signed   By: Genevive Bi M.D.   On: 01/30/2018 20:07   Ct Angio Neck W And/or Wo Contrast  Result Date: 01/30/2018 CLINICAL DATA:  Initial evaluation for acute sudden onset weakness. EXAM: CT ANGIOGRAPHY HEAD AND NECK TECHNIQUE: Multidetector CT imaging of the head and neck was performed using the standard protocol during bolus administration of intravenous contrast. Multiplanar CT  image reconstructions and MIPs were obtained to evaluate the vascular anatomy. Carotid stenosis measurements (when applicable) are obtained utilizing NASCET criteria, using the distal internal carotid diameter as the denominator. CONTRAST:  ISOVUE-370 IOPAMIDOL (ISOVUE-370) INJECTION 76% COMPARISON:  Prior head CT from earlier the same day. FINDINGS: CTA NECK FINDINGS Aortic arch: Visualized aortic arch of normal caliber with normal branch pattern. Extensive atherosclerotic change present within the visualized arch and about the origin of the great vessels. Associated stenosis of up to 40% at the origin of the left subclavian artery. Additional mild stenosis of approximately 20% at the proximal right subclavian artery just distal to the takeoff of the right vertebral artery. Visualized subclavian arteries otherwise widely patent. Right carotid system: Right common carotid artery tortuous proximally but widely patent to the bifurcation. Mixed plaque about the right bifurcation/proximal right ICA with associated stenosis  of up to 45% by NASCET criteria. Right ICA widely patent distally to the skull base without stenosis, dissection, or occlusion. Left carotid system: Left common carotid artery tortuous proximally but widely patent to the bifurcation. Mixed plaque at the left bifurcation with associated of up to 40% by NASCET criteria. Left ICA otherwise patent distally to the skull base without stenosis, dissection, or occlusion. Vertebral arteries: Left vertebral artery arises separately from the aortic arch. Right vertebral artery rises from the right subclavian artery. Plaque at the origin of the vertebral arteries bilaterally with associated fairly severe stenosis of approximately 80% at the origin of the left vertebral artery. Associated stenosis of up to 70% at the origin of the right vertebral artery. The right vertebral artery is dominant. Vertebral arteries otherwise patent within the neck without  stenosis, dissection, or occlusion. Skeleton: No acute osseous abnormality. No discrete lytic or blastic osseous lesions. Mild-to-moderate multilevel cervical spondylolysis, most notable at C5-6. Patient is edentulous. Other neck: No other acute soft tissue abnormality within the neck. Salivary glands within normal limits. Thyroid normal. No pathologically enlarged lymph nodes. Upper chest: Visualized upper chest demonstrates no acute finding. Changes compatible with mild pulmonary interstitial edema present within the visualized lungs. Review of the MIP images confirms the above findings CTA HEAD FINDINGS Anterior circulation: Petrous segments patent bilaterally. Scattered calcified atheromatous plaque within the cavernous/supraclinoid ICAs bilaterally. Relatively mild multifocal narrowing on the left. There is focal moderate stenosis at the proximal cavernous right ICA (series 7, image 115). ICA termini well perfused bilaterally. A1 segments patent bilaterally. Right A1 hypoplastic. Subtle 2 mm focal outpouching arising from the anterior communicating artery suspicious for a tiny aneurysm (series 8, image 113). Anterior cerebral arteries patent to their distal aspects without flow-limiting stenosis. M1 segments patent bilaterally without stenosis. Normal MCA bifurcations. Distal MCA branches well perfused and symmetric. Posterior circulation: Multifocal calcified plaque within the dominant right V4 segment with mild segmental tandem stenoses. Mild non stenotic plaque within the hypoplastic left vertebral artery without significant stenosis. Posterior inferior cerebral arteries patent bilaterally. Basilar mildly tortuous with short fenestration at its mid-distal aspect. No focal stenosis seen involving the basilar. Superior cerebral arteries irregular but widely patent bilaterally. Both of the posterior cerebral arteries primarily supplied via the basilar and are well perfused to their distal aspects without  stenosis. Small bilateral posterior communicating arteries noted. Venous sinuses: Patent. Anatomic variants: None significant. Delayed phase: No abnormal enhancement. Review of the MIP images confirms the above findings IMPRESSION: 1. Negative CTA for emergent large vessel occlusion. 2. Atheromatous plaque at the origin of the vertebral arteries bilaterally with severe approximate 80% stenosis on the left and 70% on the right. Additional short-segment mild tandem right V4 stenoses. Vertebrobasilar system otherwise widely patent. 3. Atheromatous stenoses about the carotid bifurcations measuring up to 45% on the right and 40% on the left. 4. 2 mm focal outpouching arising from the anterior communicating artery, suspicious for a small aneurysm. 5. Mild diffuse interlobular septal thickening within the visualized lungs, suggesting mild pulmonary interstitial edema. Electronically Signed   By: Rise MuBenjamin  McClintock M.D.   On: 01/30/2018 23:42   Dg Chest Port 1 View  Result Date: 01/30/2018 CLINICAL DATA:  Acute onset weakness. History of hypertension, atrial fibrillation. EXAM: PORTABLE CHEST 1 VIEW COMPARISON:  Chest radiograph of November 24, 2015 FINDINGS: Cardiac silhouette is mildly enlarged and unchanged. Calcified aortic arch. Reticulonodular densities increased from prior examination with blunting of the RIGHT costophrenic angle. RIGHT middle lobe airspace opacity.  Increased lung volumes. No pneumothorax. Dual lead LEFT cardiac pacemaker in situ. Soft tissue planes included osseous structures are non suspicious. IMPRESSION: 1. RIGHT middle lobe airspace opacity and increased reticulonodular densities concerning for pneumonia. Followup PA and lateral chest X-ray is recommended in 3-4 weeks following trial of antibiotic therapy to ensure resolution and exclude underlying malignancy. 2. Mild cardiomegaly. 3.  Emphysema (ICD10-J43.9).  Aortic Atherosclerosis (ICD10-I70.0). Electronically Signed   By: Awilda Metro M.D.   On: 01/30/2018 19:59    EKG: Independently reviewed.  Paced rhythm.  Assessment/Plan Principal Problem:   Community acquired pneumonia of right middle lobe of lung (HCC) Active Problems:   Hypertensive heart disease   Complete heart block (HCC)   Peripheral vascular disease (HCC)   Anemia   Mild renal insufficiency   Weakness of both legs   Elevated troponin   Parkinson's disease (HCC)   Essential hypertension   CAP (community acquired pneumonia)    1. Bilateral lower extremity weakness -left-sided weakness is improving.  Right side still persist.  Discussed with Dr. Laurence Slate on-call neurologist who advised to get a repeat CT head in 24 hours since patient cannot get MRI due to pacemaker.  CT angiogram of the head and neck did not show any large vessel emergent occlusion requiring procedure.   2. Pneumonia on Levaquin.  Check urine for Legionella strep antigen sputum cultures. 3. Chronic anemia follow CBC. 4. Parkinson's disease on Sinemet. 5. Hypertension on Cozaar hydrochlorothiazide and nifedipine. 6. Elevated BNP and troponin -denies any chest pain.  We will cycle cardiac markers check 2D echo. 7. Hyperlipidemia on statins.   DVT prophylaxis: SCDs for now until we make sure patient does not need lumbar puncture Code Status: Full code. Family Communication: Patient's daughter. Disposition Plan: To be determined. Consults called: Discussed with neurologist. Admission status: Inpatient.   Eduard Clos MD [facility] Pager [phone].  If 7PM-7AM, please contact night-coverage [email or web address] Password [email or web address]  01/31/2018, 3:29 AM

## 2018-01-31 NOTE — Progress Notes (Signed)
  Echocardiogram 2D Echocardiogram has been performed.  Celene SkeenVijay  Gordon Carlson 01/31/2018, 11:18 AM

## 2018-01-31 NOTE — Progress Notes (Signed)
Patient ID: [name], female   DOB: 1926-07-30, 84 y.o.   MRN: [id number] Patient was admitted early this morning for bilateral lower extremity weakness, initial CT head was negative for acute abnormality.  Case was discussed by admitting hospitalist with on-call neurologist who had advised to repeat CT head in 24 hours.  Patient seen and examined at bedside and plan of care discussed with her and granddaughter at bedside.  Power in right lower extremity is 3 by 5 and left lower extremity is 4+ by 5.  Upper extremities have 5 out of 5 power.  Spoke to neurologist/Dr. Wilford CornerArora on phone who agreed with the plan to have repeat CT in 24 hours of the first CT.  Continue antibiotics.  Troponins have not trended up.  No chest pain.  Follow 2D echo.  PT eval.  Reviewed this morning's H&P and prior medical records myself.  Reviewed labs and current medications.  Repeat a.m. labs.

## 2018-01-31 NOTE — Progress Notes (Signed)
Pharmacy Antibiotic Note  Robin SilversBernell G [name] is a 84 y.o. female admitted on 01/30/2018 with pneumonia.  Pharmacy has been consulted for Levofloxacin dosing.  Plan: Levofloxacin 750mg  iv q48hr  Levofloxacin dosed based on patient weight and renal function     Height: 5\' 3"  (160 cm) Weight: 154 lb 1.6 oz (69.9 kg) IBW/kg (Calculated) : 52.4  Temp (24hrs), Avg:99 F (37.2 C), Min:98.8 F (37.1 C), Max:99.1 F (37.3 C)  Recent Labs  Lab 01/30/18 2005  WBC 4.3  CREATININE 1.18*    Estimated Creatinine Clearance: 30.3 mL/min (A) (by C-G formula based on SCr of 1.18 mg/dL (H)).    No Known Allergies  Antimicrobials this admission: Levofloxacin 01/31/2018 >>  Dose adjustments this admission: -  Microbiology results: -  Thank you for allowing pharmacy to be a part of this patient's care.  [name], [name] 01/31/2018 3:52 AM

## 2018-01-31 NOTE — ED Notes (Signed)
ED TO INPATIENT HANDOFF REPORT  Name/Age/Gender [name] 84 y.o. female  Code Status Code Status History    Date Active Date Inactive Code Status Order ID Comments User Context   01/30/2017 0444 01/31/2017 1920 Full Code [id number]  Briscoe Deutscher, MD ED   11/21/2015 2018 11/23/2015 1542 Full Code [id number]  Othella Boyer, MD Inpatient   08/07/2014 1752 08/08/2014 2113 Full Code [id number]  Penny Pia, MD Inpatient      Home/SNF/Other Home  Chief Complaint weakness in legs  Level of Care/Admitting Diagnosis ED Disposition    ED Disposition Condition Comment   Admit  Hospital Area: [facility] [[id number]]  Level of Care: Telemetry [5]  Admit to tele based on following criteria: Acute CHF  Diagnosis: CAP (community acquired pneumonia) [[id number]]  Admitting Physician: Eduard Clos [phone]  Attending Physician: Eduard Clos [phone]  Estimated length of stay: past midnight tomorrow  Certification:: I certify this patient will need inpatient services for at least 2 midnights  PT Class (Do Not Modify): Inpatient [101]  PT Acc Code (Do Not Modify): Private [1]       Medical History Past Medical History:  Diagnosis Date  . Arthritis   . High cholesterol   . Hypertension   . Paroxysmal atrial fibrillation (HCC)    a. identified on device interrogation 03/2017, all episodes to date <1 minute  . S/P hysterectomy   . Second degree AV block    a. s/p STJ dual chamber PPM Dr Johney Frame  . Syncope     Allergies No Known Allergies  IV Location/Drains/Wounds Patient Lines/Drains/Airways Status   Active Line/Drains/Airways    Name:   Placement date:   Placement time:   Site:   Days:   Peripheral IV 01/30/18 Right Antecubital   01/30/18    2014    Antecubital   1          Labs/Imaging Results for orders placed or performed during the hospital encounter of 01/30/18 (from the past 48 hour(s))  CBC with Differential/Platelet     Status:  Abnormal   Collection Time: 01/30/18  8:05 PM  Result Value Ref Range   WBC 4.3 4.0 - 10.5 K/uL   RBC 3.34 (L) 3.87 - 5.11 MIL/uL   Hemoglobin 9.7 (L) 12.0 - 15.0 g/dL   HCT 84.1 (L) 32.4 - 40.1 %   MCV 95.8 80.0 - 100.0 fL   MCH 29.0 26.0 - 34.0 pg   MCHC 30.3 30.0 - 36.0 g/dL   RDW 02.7 25.3 - 66.4 %   Platelets 165 150 - 400 K/uL   nRBC 0.0 0.0 - 0.2 %   Neutrophils Relative % 64 %   Neutro Abs 2.8 1.7 - 7.7 K/uL   Lymphocytes Relative 14 %   Lymphs Abs 0.6 (L) 0.7 - 4.0 K/uL   Monocytes Relative 17 %   Monocytes Absolute 0.7 0.1 - 1.0 K/uL   Eosinophils Relative 3 %   Eosinophils Absolute 0.1 0.0 - 0.5 K/uL   Basophils Relative 1 %   Basophils Absolute 0.0 0.0 - 0.1 K/uL   Immature Granulocytes 1 %   Abs Immature Granulocytes 0.02 0.00 - 0.07 K/uL    Comment: Performed at [facility], [address] [place], [place] [address]  Comprehensive metabolic panel     Status: Abnormal   Collection Time: 01/30/18  8:05 PM  Result Value Ref Range   Sodium 133 (L) 135 - 145 mmol/L  Potassium 4.2 3.5 - 5.1 mmol/L   [place] 96 (L) 98 - 111 mmol/L   CO2 28 22 - 32 mmol/L   Glucose, Bld 99 70 - 99 mg/dL   BUN 11 8 - 23 mg/dL   Creatinine, Ser 1.19 (H) 0.44 - 1.00 mg/dL   Calcium 9.1 8.9 - 14.7 mg/dL   Total Protein 8.2 (H) 6.5 - 8.1 g/dL   Albumin 3.3 (L) 3.5 - 5.0 g/dL   AST 19 15 - 41 U/L   ALT 6 0 - 44 U/L   Alkaline Phosphatase 53 38 - 126 U/L   Total Bilirubin 0.6 0.3 - 1.2 mg/dL   GFR calc non Af Amer 41 (L) >60 mL/min   GFR calc Af Amer 47 (L) >60 mL/min   Anion gap 9 5 - 15    Comment: Performed at [facility], [address] [place], [place] [address]  Brain natriuretic peptide     Status: Abnormal   Collection Time: 01/30/18  8:05 PM  Result Value Ref Range   B Natriuretic Peptide 978.0 (H) 0.0 - 100.0 pg/mL    Comment: Performed at [facility], [address], [place], [place] [address]  Troponin I -  ONCE - STAT     Status: Abnormal   Collection Time: 01/30/18  8:05 PM  Result Value Ref Range   Troponin I 0.12 (HH) <0.03 ng/mL    Comment: CRITICAL RESULT CALLED TO, READ BACK BY AND VERIFIED WITH: M.TEUP AT 2103 ON 01/30/18 BY N.THOMPSON Performed at [facility], [address], [place], [place] [address]    Ct Angio Head W Or Wo Contrast  Result Date: 01/30/2018 CLINICAL DATA:  Initial evaluation for acute sudden onset weakness. EXAM: CT ANGIOGRAPHY HEAD AND NECK TECHNIQUE: Multidetector CT imaging of the head and neck was performed using the standard protocol during bolus administration of intravenous contrast. Multiplanar CT image reconstructions and MIPs were obtained to evaluate the vascular anatomy. Carotid stenosis measurements (when applicable) are obtained utilizing NASCET criteria, using the distal internal carotid diameter as the denominator. CONTRAST:  ISOVUE-370 IOPAMIDOL (ISOVUE-370) INJECTION 76% COMPARISON:  Prior head CT from earlier the same day. FINDINGS: CTA NECK FINDINGS Aortic arch: Visualized aortic arch of normal caliber with normal branch pattern. Extensive atherosclerotic change present within the visualized arch and about the origin of the great vessels. Associated stenosis of up to 40% at the origin of the left subclavian artery. Additional mild stenosis of approximately 20% at the proximal right subclavian artery just distal to the takeoff of the right vertebral artery. Visualized subclavian arteries otherwise widely patent. Right carotid system: Right common carotid artery tortuous proximally but widely patent to the bifurcation. Mixed plaque about the right bifurcation/proximal right ICA with associated stenosis of up to 45% by NASCET criteria. Right ICA widely patent distally to the skull base without stenosis, dissection, or occlusion. Left carotid system: Left common carotid artery tortuous proximally but widely patent to the bifurcation. Mixed  plaque at the left bifurcation with associated of up to 40% by NASCET criteria. Left ICA otherwise patent distally to the skull base without stenosis, dissection, or occlusion. Vertebral arteries: Left vertebral artery arises separately from the aortic arch. Right vertebral artery rises from the right subclavian artery. Plaque at the origin of the vertebral arteries bilaterally with associated fairly severe stenosis of approximately 80% at the origin of the left vertebral artery. Associated stenosis of up to 70% at the origin of the  right vertebral artery. The right vertebral artery is dominant. Vertebral arteries otherwise patent within the neck without stenosis, dissection, or occlusion. Skeleton: No acute osseous abnormality. No discrete lytic or blastic osseous lesions. Mild-to-moderate multilevel cervical spondylolysis, most notable at C5-6. Patient is edentulous. Other neck: No other acute soft tissue abnormality within the neck. Salivary glands within normal limits. Thyroid normal. No pathologically enlarged lymph nodes. Upper chest: Visualized upper chest demonstrates no acute finding. Changes compatible with mild pulmonary interstitial edema present within the visualized lungs. Review of the MIP images confirms the above findings CTA HEAD FINDINGS Anterior circulation: Petrous segments patent bilaterally. Scattered calcified atheromatous plaque within the cavernous/supraclinoid ICAs bilaterally. Relatively mild multifocal narrowing on the left. There is focal moderate stenosis at the proximal cavernous right ICA (series 7, image 115). ICA termini well perfused bilaterally. A1 segments patent bilaterally. Right A1 hypoplastic. Subtle 2 mm focal outpouching arising from the anterior communicating artery suspicious for a tiny aneurysm (series 8, image 113). Anterior cerebral arteries patent to their distal aspects without flow-limiting stenosis. M1 segments patent bilaterally without stenosis. Normal MCA  bifurcations. Distal MCA branches well perfused and symmetric. Posterior circulation: Multifocal calcified plaque within the dominant right V4 segment with mild segmental tandem stenoses. Mild non stenotic plaque within the hypoplastic left vertebral artery without significant stenosis. Posterior inferior cerebral arteries patent bilaterally. Basilar mildly tortuous with short fenestration at its mid-distal aspect. No focal stenosis seen involving the basilar. Superior cerebral arteries irregular but widely patent bilaterally. Both of the posterior cerebral arteries primarily supplied via the basilar and are well perfused to their distal aspects without stenosis. Small bilateral posterior communicating arteries noted. Venous sinuses: Patent. Anatomic variants: None significant. Delayed phase: No abnormal enhancement. Review of the MIP images confirms the above findings IMPRESSION: 1. Negative CTA for emergent large vessel occlusion. 2. Atheromatous plaque at the origin of the vertebral arteries bilaterally with severe approximate 80% stenosis on the left and 70% on the right. Additional short-segment mild tandem right V4 stenoses. Vertebrobasilar system otherwise widely patent. 3. Atheromatous stenoses about the carotid bifurcations measuring up to 45% on the right and 40% on the left. 4. 2 mm focal outpouching arising from the anterior communicating artery, suspicious for a small aneurysm. 5. Mild diffuse interlobular septal thickening within the visualized lungs, suggesting mild pulmonary interstitial edema. Electronically Signed   By: Rise Mu M.D.   On: 01/30/2018 23:42   Ct Head Wo Contrast  Result Date: 01/30/2018 CLINICAL DATA:  Pt BIB [facility] from home. Pt had sudden onset weakness while walking back to her room from her bathroom. Pt has bilateral edema she stated that her right is normally edematous but the left leg is new. Pt is not able to walk current.*comment was truncated*Focal neuro  deficit, > 6 hrs, stroke suspected EXAM: CT HEAD WITHOUT CONTRAST TECHNIQUE: Contiguous axial images were obtained from the base of the skull through the vertex without intravenous contrast. COMPARISON:  None. FINDINGS: Brain: No acute intracranial hemorrhage. No focal mass lesion. No CT evidence of acute infarction. No midline shift or mass effect. No hydrocephalus. Basilar cisterns are patent. Minimal periventricular white matter hypodensities. Vascular: No hyperdense vessel or unexpected calcification. Skull: Normal. Negative for fracture or focal lesion. Sinuses/Orbits: Paranasal sinuses and mastoid air cells are clear. Orbits are clear. Other: None. IMPRESSION: Normal head CT for age. Electronically Signed   By: Genevive Bi M.D.   On: 01/30/2018 20:07   Ct Angio Neck W And/or Wo Contrast  Result  Date: 01/30/2018 CLINICAL DATA:  Initial evaluation for acute sudden onset weakness. EXAM: CT ANGIOGRAPHY HEAD AND NECK TECHNIQUE: Multidetector CT imaging of the head and neck was performed using the standard protocol during bolus administration of intravenous contrast. Multiplanar CT image reconstructions and MIPs were obtained to evaluate the vascular anatomy. Carotid stenosis measurements (when applicable) are obtained utilizing NASCET criteria, using the distal internal carotid diameter as the denominator. CONTRAST:  ISOVUE-370 IOPAMIDOL (ISOVUE-370) INJECTION 76% COMPARISON:  Prior head CT from earlier the same day. FINDINGS: CTA NECK FINDINGS Aortic arch: Visualized aortic arch of normal caliber with normal branch pattern. Extensive atherosclerotic change present within the visualized arch and about the origin of the great vessels. Associated stenosis of up to 40% at the origin of the left subclavian artery. Additional mild stenosis of approximately 20% at the proximal right subclavian artery just distal to the takeoff of the right vertebral artery. Visualized subclavian arteries otherwise widely  patent. Right carotid system: Right common carotid artery tortuous proximally but widely patent to the bifurcation. Mixed plaque about the right bifurcation/proximal right ICA with associated stenosis of up to 45% by NASCET criteria. Right ICA widely patent distally to the skull base without stenosis, dissection, or occlusion. Left carotid system: Left common carotid artery tortuous proximally but widely patent to the bifurcation. Mixed plaque at the left bifurcation with associated of up to 40% by NASCET criteria. Left ICA otherwise patent distally to the skull base without stenosis, dissection, or occlusion. Vertebral arteries: Left vertebral artery arises separately from the aortic arch. Right vertebral artery rises from the right subclavian artery. Plaque at the origin of the vertebral arteries bilaterally with associated fairly severe stenosis of approximately 80% at the origin of the left vertebral artery. Associated stenosis of up to 70% at the origin of the right vertebral artery. The right vertebral artery is dominant. Vertebral arteries otherwise patent within the neck without stenosis, dissection, or occlusion. Skeleton: No acute osseous abnormality. No discrete lytic or blastic osseous lesions. Mild-to-moderate multilevel cervical spondylolysis, most notable at C5-6. Patient is edentulous. Other neck: No other acute soft tissue abnormality within the neck. Salivary glands within normal limits. Thyroid normal. No pathologically enlarged lymph nodes. Upper chest: Visualized upper chest demonstrates no acute finding. Changes compatible with mild pulmonary interstitial edema present within the visualized lungs. Review of the MIP images confirms the above findings CTA HEAD FINDINGS Anterior circulation: Petrous segments patent bilaterally. Scattered calcified atheromatous plaque within the cavernous/supraclinoid ICAs bilaterally. Relatively mild multifocal narrowing on the left. There is focal moderate  stenosis at the proximal cavernous right ICA (series 7, image 115). ICA termini well perfused bilaterally. A1 segments patent bilaterally. Right A1 hypoplastic. Subtle 2 mm focal outpouching arising from the anterior communicating artery suspicious for a tiny aneurysm (series 8, image 113). Anterior cerebral arteries patent to their distal aspects without flow-limiting stenosis. M1 segments patent bilaterally without stenosis. Normal MCA bifurcations. Distal MCA branches well perfused and symmetric. Posterior circulation: Multifocal calcified plaque within the dominant right V4 segment with mild segmental tandem stenoses. Mild non stenotic plaque within the hypoplastic left vertebral artery without significant stenosis. Posterior inferior cerebral arteries patent bilaterally. Basilar mildly tortuous with short fenestration at its mid-distal aspect. No focal stenosis seen involving the basilar. Superior cerebral arteries irregular but widely patent bilaterally. Both of the posterior cerebral arteries primarily supplied via the basilar and are well perfused to their distal aspects without stenosis. Small bilateral posterior communicating arteries noted. Venous sinuses: Patent. Anatomic variants:  None significant. Delayed phase: No abnormal enhancement. Review of the MIP images confirms the above findings IMPRESSION: 1. Negative CTA for emergent large vessel occlusion. 2. Atheromatous plaque at the origin of the vertebral arteries bilaterally with severe approximate 80% stenosis on the left and 70% on the right. Additional short-segment mild tandem right V4 stenoses. Vertebrobasilar system otherwise widely patent. 3. Atheromatous stenoses about the carotid bifurcations measuring up to 45% on the right and 40% on the left. 4. 2 mm focal outpouching arising from the anterior communicating artery, suspicious for a small aneurysm. 5. Mild diffuse interlobular septal thickening within the visualized lungs, suggesting mild  pulmonary interstitial edema. Electronically Signed   By: Rise MuBenjamin  McClintock M.D.   On: 01/30/2018 23:42   Dg Chest Port 1 View  Result Date: 01/30/2018 CLINICAL DATA:  Acute onset weakness. History of hypertension, atrial fibrillation. EXAM: PORTABLE CHEST 1 VIEW COMPARISON:  Chest radiograph of November 24, 2015 FINDINGS: Cardiac silhouette is mildly enlarged and unchanged. Calcified aortic arch. Reticulonodular densities increased from prior examination with blunting of the RIGHT costophrenic angle. RIGHT middle lobe airspace opacity. Increased lung volumes. No pneumothorax. Dual lead LEFT cardiac pacemaker in situ. Soft tissue planes included osseous structures are non suspicious. IMPRESSION: 1. RIGHT middle lobe airspace opacity and increased reticulonodular densities concerning for pneumonia. Followup PA and lateral chest X-ray is recommended in 3-4 weeks following trial of antibiotic therapy to ensure resolution and exclude underlying malignancy. 2. Mild cardiomegaly. 3.  Emphysema (ICD10-J43.9).  Aortic Atherosclerosis (ICD10-I70.0). Electronically Signed   By: Awilda Metroourtnay  Bloomer M.D.   On: 01/30/2018 19:59    Pending Labs Unresulted Labs (From admission, onward)    Start     Ordered   01/30/18 1938  Urinalysis, Routine w reflex microscopic  Once,   R     01/30/18 1937          Vitals/Pain Today's Vitals   01/30/18 2030 01/30/18 2043 01/30/18 2130 01/31/18 0008  BP: (!) 170/65  (!) 161/63 (!) 161/59  Pulse: 64  61 70  Resp: (!) 22  18 (!) 24  Temp:      TempSrc:      SpO2: 100%  100% 100%  Weight:      Height:      PainSc:  0-No pain      Isolation Precautions No active isolations  Medications Medications  sodium chloride (PF) 0.9 % injection (has no administration in time range)  iopamidol (ISOVUE-370) 76 % injection (has no administration in time range)  levofloxacin (LEVAQUIN) IVPB 500 mg (0 mg Intravenous Stopped 01/31/18 0037)  iopamidol (ISOVUE-370) 76 %  injection 100 mL (100 mLs Intravenous Contrast Given 01/30/18 2251)    Mobility manual wheelchair

## 2018-02-01 DIAGNOSIS — J159 Unspecified bacterial pneumonia: Secondary | ICD-10-CM | POA: Diagnosis not present

## 2018-02-01 DIAGNOSIS — R7989 Other specified abnormal findings of blood chemistry: Secondary | ICD-10-CM | POA: Diagnosis not present

## 2018-02-01 DIAGNOSIS — I10 Essential (primary) hypertension: Secondary | ICD-10-CM | POA: Diagnosis not present

## 2018-02-01 DIAGNOSIS — I480 Paroxysmal atrial fibrillation: Secondary | ICD-10-CM | POA: Diagnosis not present

## 2018-02-01 DIAGNOSIS — E785 Hyperlipidemia, unspecified: Secondary | ICD-10-CM | POA: Diagnosis not present

## 2018-02-01 DIAGNOSIS — G20 Parkinson's disease: Secondary | ICD-10-CM

## 2018-02-01 DIAGNOSIS — I442 Atrioventricular block, complete: Secondary | ICD-10-CM | POA: Diagnosis not present

## 2018-02-01 DIAGNOSIS — R29898 Other symptoms and signs involving the musculoskeletal system: Secondary | ICD-10-CM | POA: Diagnosis not present

## 2018-02-01 DIAGNOSIS — I739 Peripheral vascular disease, unspecified: Secondary | ICD-10-CM | POA: Diagnosis not present

## 2018-02-01 DIAGNOSIS — J181 Lobar pneumonia, unspecified organism: Secondary | ICD-10-CM | POA: Diagnosis not present

## 2018-02-01 DIAGNOSIS — I119 Hypertensive heart disease without heart failure: Secondary | ICD-10-CM | POA: Diagnosis not present

## 2018-02-01 DIAGNOSIS — N289 Disorder of kidney and ureter, unspecified: Secondary | ICD-10-CM | POA: Diagnosis not present

## 2018-02-01 DIAGNOSIS — D649 Anemia, unspecified: Secondary | ICD-10-CM | POA: Diagnosis not present

## 2018-02-01 LAB — CBC WITH DIFFERENTIAL/PLATELET
Abs Immature Granulocytes: 0.01 10*3/uL (ref 0.00–0.07)
BASOS PCT: 0 %
Basophils Absolute: 0 10*3/uL (ref 0.0–0.1)
EOS ABS: 0.1 10*3/uL (ref 0.0–0.5)
Eosinophils Relative: 4 %
HCT: 28.9 % — ABNORMAL LOW (ref 36.0–46.0)
Hemoglobin: 8.6 g/dL — ABNORMAL LOW (ref 12.0–15.0)
Immature Granulocytes: 0 %
Lymphocytes Relative: 21 %
Lymphs Abs: 0.7 10*3/uL (ref 0.7–4.0)
MCH: 28.1 pg (ref 26.0–34.0)
MCHC: 29.8 g/dL — ABNORMAL LOW (ref 30.0–36.0)
MCV: 94.4 fL (ref 80.0–100.0)
MONOS PCT: 22 %
Monocytes Absolute: 0.7 10*3/uL (ref 0.1–1.0)
NEUTROS PCT: 53 %
Neutro Abs: 1.7 10*3/uL (ref 1.7–7.7)
Platelets: 170 10*3/uL (ref 150–400)
RBC: 3.06 MIL/uL — ABNORMAL LOW (ref 3.87–5.11)
RDW: 12.6 % (ref 11.5–15.5)
WBC: 3.2 10*3/uL — ABNORMAL LOW (ref 4.0–10.5)
nRBC: 0 % (ref 0.0–0.2)

## 2018-02-01 LAB — BASIC METABOLIC PANEL
Anion gap: 10 (ref 5–15)
BUN: 13 mg/dL (ref 8–23)
CALCIUM: 8.6 mg/dL — AB (ref 8.9–10.3)
CO2: 27 mmol/L (ref 22–32)
Chloride: 94 mmol/L — ABNORMAL LOW (ref 98–111)
Creatinine, Ser: 1.09 mg/dL — ABNORMAL HIGH (ref 0.44–1.00)
GFR calc Af Amer: 52 mL/min — ABNORMAL LOW (ref >60)
GFR calc non Af Amer: 45 mL/min — ABNORMAL LOW (ref >60)
GLUCOSE: 79 mg/dL (ref 70–99)
Potassium: 4 mmol/L (ref 3.5–5.1)
Sodium: 131 mmol/L — ABNORMAL LOW (ref 135–145)

## 2018-02-01 LAB — MAGNESIUM: Magnesium: 1.8 mg/dL (ref 1.7–2.4)

## 2018-02-01 MED ORDER — CEFUROXIME AXETIL 500 MG PO TABS: 500.0000 mg | ORAL_TABLET | Freq: Two times a day (BID) | ORAL | 0 refills | Status: AC

## 2018-02-01 MED ORDER — ONDANSETRON HCL 4 MG PO TABS: 4.0000 mg | ORAL_TABLET | Freq: Four times a day (QID) | ORAL | 0 refills | Status: AC | PRN

## 2018-02-01 NOTE — Discharge Summary (Signed)
Physician Discharge Summary  [name] UXL:[id number] DOB: [birth year] DOA: 01/30/2018  PCP: [name], MD  Admit date: 01/30/2018 Discharge date: 02/01/2018  Admitted From: Home Disposition:  Home  Recommendations for Outpatient Follow-up:  1. Follow up with PCP in 1 week 2. Outpatient follow-up with neurology 3. Follow-up in the ED if symptoms worsen or new appear 4. Outpatient follow-up with cardiology   Home Health: Yes: PT Equipment/Devices: None Discharge Condition: Stable CODE STATUS: Full Diet recommendation: Heart Healthy  Brief/Interim Summary: 84 year old female with history of heart block status post pacemaker, hypertension, chronic anemia, Parkinson's disease presented with sudden weakness of both lower extremities along with cough.  Initial CT of the head was negative for acute abnormality.  CT angiogram of the head and neck did not show any large vessel occlusion or any infarcts.  Could not do MRI due to pacemaker.  Neurology recommended repeat CT in 24 hours.  She was also found to have pneumonia and started on antibiotics.  She tolerated PT well.  Repeat CT in 24 hours did not show any acute stroke.  Discussed with neurology on phone who is okay for the patient to be discharged.  Weakness is improving.  She will be discharged with home health on oral antibiotics.  Discharge Diagnoses:  Principal Problem:   Community acquired pneumonia of right middle lobe of lung (HCC) Active Problems:   Hypertensive heart disease   Complete heart block (HCC)   Peripheral vascular disease (HCC)   Anemia   Mild renal insufficiency   Weakness of both legs   Elevated troponin   Parkinson's disease (HCC)   Essential hypertension   CAP (community acquired pneumonia)  Bilateral lower extremity weakness -Questionable cause.  Much improved.  Right lower extremity is slightly more weak but patient is tolerating PT.  CT of the head on admission and repeat CT of the head  after 24 hours have been negative for acute abnormality.  CT angiogram of the head and neck did not show any large vessel emergent occlusion requiring procedure.  Unable to get MRI due to pacemaker.  Communicated with neurologist on phone about the patient who is okay for the patient to be discharged. -Might benefit from outpatient neurology evaluation.  Discharge home with home PT  Committee acquired bacterial lobar pneumonia -Respiratory status stable.  Currently on room air.  Will discharge on 5 more days of Ceftin.  Received Levaquin initially which was changed to Rocephin and Zithromax  Parkinson's disease -Continue Sinemet.  Outpatient follow-up  Hypertension -Continue home regimen  Mildly positive troponins -Troponins did not trend up.  No chest pain.  Echo showed EF of 50 to 55% with left ventricular diastolic dysfunction.  Outpatient follow-up with cardiology  Hyperlipidemia -Continue statins  Discharge Instructions  Discharge Instructions    Ambulatory referral to Neurology   Complete by:  As directed    An appointment is requested in approximately: 2 weeks for weakness   Call MD for:  difficulty breathing, headache or visual disturbances   Complete by:  As directed    Call MD for:  extreme fatigue   Complete by:  As directed    Call MD for:  hives   Complete by:  As directed    Call MD for:  persistant dizziness or light-headedness   Complete by:  As directed    Call MD for:  persistant nausea and vomiting   Complete by:  As directed    Call MD for:  severe uncontrolled pain  Complete by:  As directed    Call MD for:  temperature >100.4   Complete by:  As directed    Diet - low sodium heart healthy   Complete by:  As directed    Increase activity slowly   Complete by:  As directed      Allergies as of 02/01/2018   No Known Allergies     Medication List    TAKE these medications   aspirin EC 81 MG tablet Take 81 mg by mouth every evening.    calcium-vitamin D 500-200 MG-UNIT tablet Commonly known as:  OSCAL WITH D Take 1 tablet by mouth 2 (two) times daily.   carbidopa-levodopa 10-100 MG tablet Commonly known as:  SINEMET IR Take 1 tablet by mouth 2 (two) times daily.   cefUROXime 500 MG tablet Commonly known as:  CEFTIN Take 1 tablet (500 mg total) by mouth 2 (two) times daily for 5 days.   docusate sodium 100 MG capsule Commonly known as:  COLACE Take 100 mg by mouth daily as needed for mild constipation.   ergocalciferol 1.25 MG (50000 UT) capsule Commonly known as:  VITAMIN D2 Take 50,000 Units by mouth every 14 (fourteen) days.   losartan-hydrochlorothiazide 50-12.5 MG tablet Commonly known as:  HYZAAR Take 1 tablet by mouth daily with breakfast.   NIFEdipine 30 MG 24 hr tablet Commonly known as:  PROCARDIA-XL/NIFEDICAL-XL TAKE 1 TABLET(30 MG) BY MOUTH DAILY What changed:  See the new instructions.   ondansetron 4 MG tablet Commonly known as:  ZOFRAN Take 1 tablet (4 mg total) by mouth every 6 (six) hours as needed for nausea.   simvastatin 20 MG tablet Commonly known as:  ZOCOR Take 20 mg by mouth at bedtime.   SYSTANE 0.4-0.3 % Soln Generic drug:  Polyethyl Glycol-Propyl Glycol Place 1 drop into both eyes 2 (two) times daily.   vitamin B-12 500 MCG tablet Commonly known as:  CYANOCOBALAMIN Take 500 mcg by mouth daily with breakfast.      Follow-up Information    [name], MD. Schedule an appointment as soon as possible for a visit in 1 week(s).   Specialty:  Internal Medicine Contact information: [address] [place] [address] [phone]          No Known Allergies  Consultations:  Spoke to neurology on phone   Procedures/Studies: Ct Angio Head W Or Wo Contrast  Result Date: 01/30/2018 CLINICAL DATA:  Initial evaluation for acute sudden onset weakness. EXAM: CT ANGIOGRAPHY HEAD AND NECK TECHNIQUE: Multidetector CT imaging of the head and neck was performed  using the standard protocol during bolus administration of intravenous contrast. Multiplanar CT image reconstructions and MIPs were obtained to evaluate the vascular anatomy. Carotid stenosis measurements (when applicable) are obtained utilizing NASCET criteria, using the distal internal carotid diameter as the denominator. CONTRAST:  ISOVUE-370 IOPAMIDOL (ISOVUE-370) INJECTION 76% COMPARISON:  Prior head CT from earlier the same day. FINDINGS: CTA NECK FINDINGS Aortic arch: Visualized aortic arch of normal caliber with normal branch pattern. Extensive atherosclerotic change present within the visualized arch and about the origin of the great vessels. Associated stenosis of up to 40% at the origin of the left subclavian artery. Additional mild stenosis of approximately 20% at the proximal right subclavian artery just distal to the takeoff of the right vertebral artery. Visualized subclavian arteries otherwise widely patent. Right carotid system: Right common carotid artery tortuous proximally but widely patent to the bifurcation. Mixed plaque about the right bifurcation/proximal right ICA  with associated stenosis of up to 45% by NASCET criteria. Right ICA widely patent distally to the skull base without stenosis, dissection, or occlusion. Left carotid system: Left common carotid artery tortuous proximally but widely patent to the bifurcation. Mixed plaque at the left bifurcation with associated of up to 40% by NASCET criteria. Left ICA otherwise patent distally to the skull base without stenosis, dissection, or occlusion. Vertebral arteries: Left vertebral artery arises separately from the aortic arch. Right vertebral artery rises from the right subclavian artery. Plaque at the origin of the vertebral arteries bilaterally with associated fairly severe stenosis of approximately 80% at the origin of the left vertebral artery. Associated stenosis of up to 70% at the origin of the right vertebral artery. The right  vertebral artery is dominant. Vertebral arteries otherwise patent within the neck without stenosis, dissection, or occlusion. Skeleton: No acute osseous abnormality. No discrete lytic or blastic osseous lesions. Mild-to-moderate multilevel cervical spondylolysis, most notable at C5-6. Patient is edentulous. Other neck: No other acute soft tissue abnormality within the neck. Salivary glands within normal limits. Thyroid normal. No pathologically enlarged lymph nodes. Upper chest: Visualized upper chest demonstrates no acute finding. Changes compatible with mild pulmonary interstitial edema present within the visualized lungs. Review of the MIP images confirms the above findings CTA HEAD FINDINGS Anterior circulation: Petrous segments patent bilaterally. Scattered calcified atheromatous plaque within the cavernous/supraclinoid ICAs bilaterally. Relatively mild multifocal narrowing on the left. There is focal moderate stenosis at the proximal cavernous right ICA (series 7, image 115). ICA termini well perfused bilaterally. A1 segments patent bilaterally. Right A1 hypoplastic. Subtle 2 mm focal outpouching arising from the anterior communicating artery suspicious for a tiny aneurysm (series 8, image 113). Anterior cerebral arteries patent to their distal aspects without flow-limiting stenosis. M1 segments patent bilaterally without stenosis. Normal MCA bifurcations. Distal MCA branches well perfused and symmetric. Posterior circulation: Multifocal calcified plaque within the dominant right V4 segment with mild segmental tandem stenoses. Mild non stenotic plaque within the hypoplastic left vertebral artery without significant stenosis. Posterior inferior cerebral arteries patent bilaterally. Basilar mildly tortuous with short fenestration at its mid-distal aspect. No focal stenosis seen involving the basilar. Superior cerebral arteries irregular but widely patent bilaterally. Both of the posterior cerebral arteries  primarily supplied via the basilar and are well perfused to their distal aspects without stenosis. Small bilateral posterior communicating arteries noted. Venous sinuses: Patent. Anatomic variants: None significant. Delayed phase: No abnormal enhancement. Review of the MIP images confirms the above findings IMPRESSION: 1. Negative CTA for emergent large vessel occlusion. 2. Atheromatous plaque at the origin of the vertebral arteries bilaterally with severe approximate 80% stenosis on the left and 70% on the right. Additional short-segment mild tandem right V4 stenoses. Vertebrobasilar system otherwise widely patent. 3. Atheromatous stenoses about the carotid bifurcations measuring up to 45% on the right and 40% on the left. 4. 2 mm focal outpouching arising from the anterior communicating artery, suspicious for a small aneurysm. 5. Mild diffuse interlobular septal thickening within the visualized lungs, suggesting mild pulmonary interstitial edema. Electronically Signed   By: Rise Mu M.D.   On: 01/30/2018 23:42   Ct Head Wo Contrast  Result Date: 01/31/2018 CLINICAL DATA:  84 y/o  F; bilateral lower extremity weakness. EXAM: CT HEAD WITHOUT CONTRAST TECHNIQUE: Contiguous axial images were obtained from the base of the skull through the vertex without intravenous contrast. COMPARISON:  01/30/2018 CT head and CTA. FINDINGS: Brain: No evidence of acute infarction, hemorrhage, hydrocephalus, extra-axial  collection or mass lesion/mass effect. Nonspecific white matter hypodensities compatible with mild chronic microvascular ischemic changes. Mild volume loss of the brain. Vascular: Calcific atherosclerosis of the carotid siphons and vertebral arteries. No hyperdense vessel identified. Skull: Normal. Negative for fracture or focal lesion. Sinuses/Orbits: No acute finding. Other: None. IMPRESSION: 1. No acute intracranial abnormality identified. 2. Mild chronic microvascular ischemic changes and volume loss  of the brain for age. Electronically Signed   By: Mitzi Hansen M.D.   On: 01/31/2018 21:28   Ct Head Wo Contrast  Result Date: 01/30/2018 CLINICAL DATA:  Pt BIB [facility] from home. Pt had sudden onset weakness while walking back to her room from her bathroom. Pt has bilateral edema she stated that her right is normally edematous but the left leg is new. Pt is not able to walk current.*comment was truncated*Focal neuro deficit, > 6 hrs, stroke suspected EXAM: CT HEAD WITHOUT CONTRAST TECHNIQUE: Contiguous axial images were obtained from the base of the skull through the vertex without intravenous contrast. COMPARISON:  None. FINDINGS: Brain: No acute intracranial hemorrhage. No focal mass lesion. No CT evidence of acute infarction. No midline shift or mass effect. No hydrocephalus. Basilar cisterns are patent. Minimal periventricular white matter hypodensities. Vascular: No hyperdense vessel or unexpected calcification. Skull: Normal. Negative for fracture or focal lesion. Sinuses/Orbits: Paranasal sinuses and mastoid air cells are clear. Orbits are clear. Other: None. IMPRESSION: Normal head CT for age. Electronically Signed   By: Genevive Bi M.D.   On: 01/30/2018 20:07   Ct Angio Neck W And/or Wo Contrast  Result Date: 01/30/2018 CLINICAL DATA:  Initial evaluation for acute sudden onset weakness. EXAM: CT ANGIOGRAPHY HEAD AND NECK TECHNIQUE: Multidetector CT imaging of the head and neck was performed using the standard protocol during bolus administration of intravenous contrast. Multiplanar CT image reconstructions and MIPs were obtained to evaluate the vascular anatomy. Carotid stenosis measurements (when applicable) are obtained utilizing NASCET criteria, using the distal internal carotid diameter as the denominator. CONTRAST:  ISOVUE-370 IOPAMIDOL (ISOVUE-370) INJECTION 76% COMPARISON:  Prior head CT from earlier the same day. FINDINGS: CTA NECK FINDINGS Aortic arch: Visualized  aortic arch of normal caliber with normal branch pattern. Extensive atherosclerotic change present within the visualized arch and about the origin of the great vessels. Associated stenosis of up to 40% at the origin of the left subclavian artery. Additional mild stenosis of approximately 20% at the proximal right subclavian artery just distal to the takeoff of the right vertebral artery. Visualized subclavian arteries otherwise widely patent. Right carotid system: Right common carotid artery tortuous proximally but widely patent to the bifurcation. Mixed plaque about the right bifurcation/proximal right ICA with associated stenosis of up to 45% by NASCET criteria. Right ICA widely patent distally to the skull base without stenosis, dissection, or occlusion. Left carotid system: Left common carotid artery tortuous proximally but widely patent to the bifurcation. Mixed plaque at the left bifurcation with associated of up to 40% by NASCET criteria. Left ICA otherwise patent distally to the skull base without stenosis, dissection, or occlusion. Vertebral arteries: Left vertebral artery arises separately from the aortic arch. Right vertebral artery rises from the right subclavian artery. Plaque at the origin of the vertebral arteries bilaterally with associated fairly severe stenosis of approximately 80% at the origin of the left vertebral artery. Associated stenosis of up to 70% at the origin of the right vertebral artery. The right vertebral artery is dominant. Vertebral arteries otherwise patent within the neck without stenosis,  dissection, or occlusion. Skeleton: No acute osseous abnormality. No discrete lytic or blastic osseous lesions. Mild-to-moderate multilevel cervical spondylolysis, most notable at C5-6. Patient is edentulous. Other neck: No other acute soft tissue abnormality within the neck. Salivary glands within normal limits. Thyroid normal. No pathologically enlarged lymph nodes. Upper chest: Visualized  upper chest demonstrates no acute finding. Changes compatible with mild pulmonary interstitial edema present within the visualized lungs. Review of the MIP images confirms the above findings CTA HEAD FINDINGS Anterior circulation: Petrous segments patent bilaterally. Scattered calcified atheromatous plaque within the cavernous/supraclinoid ICAs bilaterally. Relatively mild multifocal narrowing on the left. There is focal moderate stenosis at the proximal cavernous right ICA (series 7, image 115). ICA termini well perfused bilaterally. A1 segments patent bilaterally. Right A1 hypoplastic. Subtle 2 mm focal outpouching arising from the anterior communicating artery suspicious for a tiny aneurysm (series 8, image 113). Anterior cerebral arteries patent to their distal aspects without flow-limiting stenosis. M1 segments patent bilaterally without stenosis. Normal MCA bifurcations. Distal MCA branches well perfused and symmetric. Posterior circulation: Multifocal calcified plaque within the dominant right V4 segment with mild segmental tandem stenoses. Mild non stenotic plaque within the hypoplastic left vertebral artery without significant stenosis. Posterior inferior cerebral arteries patent bilaterally. Basilar mildly tortuous with short fenestration at its mid-distal aspect. No focal stenosis seen involving the basilar. Superior cerebral arteries irregular but widely patent bilaterally. Both of the posterior cerebral arteries primarily supplied via the basilar and are well perfused to their distal aspects without stenosis. Small bilateral posterior communicating arteries noted. Venous sinuses: Patent. Anatomic variants: None significant. Delayed phase: No abnormal enhancement. Review of the MIP images confirms the above findings IMPRESSION: 1. Negative CTA for emergent large vessel occlusion. 2. Atheromatous plaque at the origin of the vertebral arteries bilaterally with severe approximate 80% stenosis on the left  and 70% on the right. Additional short-segment mild tandem right V4 stenoses. Vertebrobasilar system otherwise widely patent. 3. Atheromatous stenoses about the carotid bifurcations measuring up to 45% on the right and 40% on the left. 4. 2 mm focal outpouching arising from the anterior communicating artery, suspicious for a small aneurysm. 5. Mild diffuse interlobular septal thickening within the visualized lungs, suggesting mild pulmonary interstitial edema. Electronically Signed   By: Rise MuBenjamin  McClintock M.D.   On: 01/30/2018 23:42   Dg Pelvis Portable  Result Date: 01/31/2018 CLINICAL DATA:  Bilateral lower extremity weakness. EXAM: PORTABLE PELVIS 1-2 VIEWS COMPARISON:  Pelvic CT of 01/30/2017 FINDINGS: Contrast within the urinary bladder and distal ureters. Femoral heads are located. Extensive artifact from overlying support apparatus. No acute fracture. Sacroiliac joints are symmetric. Lower lumbar and lumbosacral spondylosis, suboptimally evaluated. IMPRESSION: No acute findings. Electronically Signed   By: Jeronimo GreavesKyle  Talbot M.D.   On: 01/31/2018 08:28   Dg Chest Port 1 View  Result Date: 01/30/2018 CLINICAL DATA:  Acute onset weakness. History of hypertension, atrial fibrillation. EXAM: PORTABLE CHEST 1 VIEW COMPARISON:  Chest radiograph of November 24, 2015 FINDINGS: Cardiac silhouette is mildly enlarged and unchanged. Calcified aortic arch. Reticulonodular densities increased from prior examination with blunting of the RIGHT costophrenic angle. RIGHT middle lobe airspace opacity. Increased lung volumes. No pneumothorax. Dual lead LEFT cardiac pacemaker in situ. Soft tissue planes included osseous structures are non suspicious. IMPRESSION: 1. RIGHT middle lobe airspace opacity and increased reticulonodular densities concerning for pneumonia. Followup PA and lateral chest X-ray is recommended in 3-4 weeks following trial of antibiotic therapy to ensure resolution and exclude underlying malignancy. 2.  Mild  cardiomegaly. 3.  Emphysema (ICD10-J43.9).  Aortic Atherosclerosis (ICD10-I70.0). Electronically Signed   By: Awilda Metro M.D.   On: 01/30/2018 19:59    Echo on 01/31/2018 Study Conclusions  - Left ventricle: The cavity size was normal. Wall thickness was   increased in a pattern of mild LVH. Systolic function was normal.   The estimated ejection fraction was in the range of 50% to 55%.   Findings consistent with left ventricular diastolic dysfunction,   grade indeterminate. - Regional wall motion abnormality: Mild hypokinesis of the apical   anterior, apical inferior, apical septal, apical lateral, and   apical myocardium. - Aortic valve: Mildly to moderately calcified annulus. Trileaflet. - Mitral valve: There was mild regurgitation. - Right ventricle: The cavity size was mildly dilated. Wall   thickness was normal. Pacer wire or catheter noted in right   ventricle. - Right atrium: The atrium was mildly dilated. Pacer wire or   catheter noted in right atrium. - Tricuspid valve: There was moderate regurgitation. - Pulmonary arteries: PA peak pressure: 46 mm Hg (S). - Systemic veins: IVC is dilated with normal respiratory variation.   Estimated CVP 8 mmHg.   Subjective: Patient seen and examined at bedside.  Denies any worsening overnight fever, nausea, vomiting or cough.  Lower extremity weakness is improving.  She wants to go home.  Discharge Exam: Vitals:   01/31/18 2056 02/01/18 0443  BP: (!) 144/67 (!) 140/54  Pulse: 60 61  Resp: 18 18  Temp: 98.9 F (37.2 C) 98.2 F (36.8 C)  SpO2: 98% 100%   Vitals:   01/31/18 0503 01/31/18 1410 01/31/18 2056 02/01/18 0443  BP: (!) 135/50 (!) 141/56 (!) 144/67 (!) 140/54  Pulse: 60 60 60 61  Resp: 18 14 18 18   Temp: 98.2 F (36.8 C) 98.1 F (36.7 C) 98.9 F (37.2 C) 98.2 F (36.8 C)  TempSrc: Oral Oral Oral Oral  SpO2: 97% 100% 98% 100%  Weight:      Height:        General: Pt is sleepy, wakes up on calling  her name and answers questions appropriately.  No distress Cardiovascular: rate controlled, S1/S2 + Respiratory: bilateral decreased breath sounds at bases, scattered crackles Abdominal: Soft, NT, ND, bowel sounds + Extremities: no edema, no cyanosis CNS: Right lower extremity is almost 4 out of 5, left lower extremity power is 4+ out of 5.    The results of significant diagnostics from this hospitalization (including imaging, microbiology, ancillary and laboratory) are listed below for reference.     Microbiology: Recent Results (from the past 240 hour(s))  Culture, sputum-assessment     Status: None   Collection Time: 01/31/18  3:26 AM  Result Value Ref Range Status   Specimen Description SPUTUM  Final   Special Requests NONE  Final   Sputum evaluation   Final    THIS SPECIMEN IS ACCEPTABLE FOR SPUTUM CULTURE Performed at [facility], [address], [place], [place] [address]    Report Status 01/31/2018 FINAL  Final  Culture, respiratory     Status: None (Preliminary result)   Collection Time: 01/31/18  3:26 AM  Result Value Ref Range Status   Specimen Description   Final    SPUTUM Performed at [facility], [address], [place], [place] [address]    Special Requests   Final    NONE Reflexed from [id number] [id number] Performed at [facility], [address], [place], [place] [address]  Gram Stain   Final    RARE WBC PRESENT, PREDOMINANTLY PMN NO SQUAMOUS EPITHELIAL CELLS SEEN FEW GRAM POSITIVE COCCI Performed at [facility], [address] [place], [place] [address]    Culture PENDING  Incomplete   Report Status PENDING  Incomplete     Labs: BNP (last 3 results) Recent Labs    01/30/18 2005  BNP 978.0*   Basic Metabolic Panel: Recent Labs  Lab 01/30/18 2005 01/31/18 0426 02/01/18 0503  NA 133* 133* 131*  K 4.2 4.2 4.0  CL 96* 96* 94*  CO2 28 27 27   GLUCOSE 99 81 79  BUN 11 11 13   CREATININE  1.18* 1.10* 1.09*  CALCIUM 9.1 8.6* 8.6*  MG  --   --  1.8   Liver Function Tests: Recent Labs  Lab 01/30/18 2005  AST 19  ALT 6  ALKPHOS 53  BILITOT 0.6  PROT 8.2*  ALBUMIN 3.3*   No results for input(s): LIPASE, AMYLASE in the last 168 hours. No results for input(s): AMMONIA in the last 168 hours. CBC: Recent Labs  Lab 01/30/18 2005 01/31/18 0426 02/01/18 0503  WBC 4.3 3.3* 3.2*  NEUTROABS 2.8  --  1.7  HGB 9.7* 8.3* 8.6*  HCT 32.0* 27.0* 28.9*  MCV 95.8 95.4 94.4  PLT 165 157 170   Cardiac Enzymes: Recent Labs  Lab 01/30/18 2005 01/31/18 0426 01/31/18 0920 01/31/18 1627  CKTOTAL  --  97  --   --   TROPONINI 0.12* 0.11* 0.09* 0.06*   BNP: Invalid input(s): POCBNP CBG: No results for input(s): GLUCAP in the last 168 hours. D-Dimer No results for input(s): DDIMER in the last 72 hours. Hgb A1c No results for input(s): HGBA1C in the last 72 hours. Lipid Profile No results for input(s): CHOL, HDL, LDLCALC, TRIG, CHOLHDL, LDLDIRECT in the last 72 hours. Thyroid function studies Recent Labs    01/31/18 0426  TSH 1.557   Anemia work up No results for input(s): VITAMINB12, FOLATE, FERRITIN, TIBC, IRON, RETICCTPCT in the last 72 hours. Urinalysis    Component Value Date/Time   COLORURINE YELLOW 01/30/2018 2005   APPEARANCEUR CLEAR 01/30/2018 2005   LABSPEC 1.016 01/30/2018 2005   PHURINE 8.0 01/30/2018 2005   GLUCOSEU NEGATIVE 01/30/2018 2005   HGBUR MODERATE (A) 01/30/2018 2005   BILIRUBINUR NEGATIVE 01/30/2018 2005   KETONESUR NEGATIVE 01/30/2018 2005   PROTEINUR NEGATIVE 01/30/2018 2005   UROBILINOGEN 0.2 08/07/2014 1310   NITRITE NEGATIVE 01/30/2018 2005   LEUKOCYTESUR NEGATIVE 01/30/2018 2005   Sepsis Labs Invalid input(s): PROCALCITONIN,  WBC,  LACTICIDVEN Microbiology Recent Results (from the past 240 hour(s))  Culture, sputum-assessment     Status: None   Collection Time: 01/31/18  3:26 AM  Result Value Ref Range Status   Specimen  Description SPUTUM  Final   Special Requests NONE  Final   Sputum evaluation   Final    THIS SPECIMEN IS ACCEPTABLE FOR SPUTUM CULTURE Performed at [facility], [address], [place], [place] [address]    Report Status 01/31/2018 FINAL  Final  Culture, respiratory     Status: None (Preliminary result)   Collection Time: 01/31/18  3:26 AM  Result Value Ref Range Status   Specimen Description   Final    SPUTUM Performed at [facility], [address], [place], [place] [address]    Special Requests   Final    NONE Reflexed from [phone] Performed at [facility], [address] [place], [place] [address]    Gram Stain   Final    RARE WBC PRESENT, PREDOMINANTLY PMN NO SQUAMOUS EPITHELIAL CELLS SEEN FEW GRAM POSITIVE COCCI Performed at [facility], [address] [place], [place] [address]    Culture PENDING  Incomplete   Report Status PENDING  Incomplete     Time coordinating discharge: 35 minutes  SIGNED:   Glade Lloyd, MD  [facility] 02/01/2018, 9:46 AM Pager: [phone]  If 7PM-7AM, please contact night-coverage [email or web address] Password [email or web address]

## 2018-02-01 NOTE — Progress Notes (Signed)
Spoke to Unit RN and pt discharge [facility]. Will [facility] Isidoro DonningAlesia Pleshette Tomasini RN CCM Case Mgmt phone [phone]

## 2018-02-02 ENCOUNTER — Encounter: Payer: Self-pay | Admitting: *Deleted

## 2018-02-02 DIAGNOSIS — J189 Pneumonia, unspecified organism: Secondary | ICD-10-CM

## 2018-02-02 DIAGNOSIS — R29898 Other symptoms and signs involving the musculoskeletal system: Secondary | ICD-10-CM | POA: Diagnosis not present

## 2018-02-02 DIAGNOSIS — J181 Lobar pneumonia, unspecified organism: Principal | ICD-10-CM

## 2018-02-02 LAB — LEGIONELLA PNEUMOPHILA SEROGP 1 UR AG: L. pneumophila Serogp 1 Ur Ag: NEGATIVE

## 2018-02-02 NOTE — Care Management Note (Signed)
Case Management Note  Patient Details  Name: Charlies SilversBernell G Carvell MRN: [id number] Date of Birth: 1926-09-23  Subjective/Objective:     CAP, HTN               Action/Plan: 02/02/2018 1000 am Contacted pt via phone. NCM spoke to pt and gave permission to speak to dtr, Trilby DrummerGaynelle Bresee. Contacted via phone and offered choice for HH/CMS list placed on chart 02/01/2018. Dtr states pt had [facility] in the past. She has a cane at home. [facility] or [facility] home.   Expected Discharge Date:  02/01/18               Expected Discharge Plan:  Home w Home Health Services  In-House Referral:  NA  Discharge planning Services  CM Consult  Post Acute Care Choice:  Home Health Choice offered to:  Patient  DME Arranged:  N/A DME Agency:  NA  HH Arranged:  PT HH Agency:  [facility]  Status of Service:  Completed, signed off  If discussed at Long Length of Stay Meetings, dates discussed:    Additional Comments:  Elliot CousinShavis, Ayomide Zuleta Ellen, RN 02/02/2018, 1:44 PM

## 2018-02-03 DIAGNOSIS — D649 Anemia, unspecified: Secondary | ICD-10-CM | POA: Diagnosis not present

## 2018-02-03 DIAGNOSIS — I739 Peripheral vascular disease, unspecified: Secondary | ICD-10-CM | POA: Diagnosis not present

## 2018-02-03 DIAGNOSIS — J181 Lobar pneumonia, unspecified organism: Secondary | ICD-10-CM | POA: Diagnosis not present

## 2018-02-03 DIAGNOSIS — I442 Atrioventricular block, complete: Secondary | ICD-10-CM | POA: Diagnosis not present

## 2018-02-03 DIAGNOSIS — G20 Parkinson's disease: Secondary | ICD-10-CM | POA: Diagnosis not present

## 2018-02-03 DIAGNOSIS — D6489 Other specified anemias: Secondary | ICD-10-CM | POA: Diagnosis not present

## 2018-02-03 DIAGNOSIS — I480 Paroxysmal atrial fibrillation: Secondary | ICD-10-CM | POA: Diagnosis not present

## 2018-02-03 DIAGNOSIS — N289 Disorder of kidney and ureter, unspecified: Secondary | ICD-10-CM | POA: Diagnosis not present

## 2018-02-03 DIAGNOSIS — I119 Hypertensive heart disease without heart failure: Secondary | ICD-10-CM | POA: Diagnosis not present

## 2018-02-03 DIAGNOSIS — M1990 Unspecified osteoarthritis, unspecified site: Secondary | ICD-10-CM | POA: Diagnosis not present

## 2018-02-03 DIAGNOSIS — I7389 Other specified peripheral vascular diseases: Secondary | ICD-10-CM | POA: Diagnosis not present

## 2018-02-03 LAB — CULTURE, RESPIRATORY W GRAM STAIN: Culture: NORMAL

## 2018-02-12 ENCOUNTER — Encounter (HOSPITAL_COMMUNITY): Payer: Self-pay

## 2018-02-12 ENCOUNTER — Emergency Department (HOSPITAL_COMMUNITY): Payer: Medicare PPO

## 2018-02-12 ENCOUNTER — Emergency Department (HOSPITAL_COMMUNITY)
Admission: EM | Admit: 2018-02-12 | Discharge: 2018-02-12 | Disposition: A | Discharge status: Home / Self Care | Payer: Medicare PPO | Source: Home / Self Care | Attending: Emergency Medicine | Admitting: Emergency Medicine

## 2018-02-12 ENCOUNTER — Other Ambulatory Visit: Payer: Self-pay

## 2018-02-12 ENCOUNTER — Observation Stay (HOSPITAL_COMMUNITY)
Admission: EM | Admit: 2018-02-12 | Discharge: 2018-02-14 | Disposition: A | Discharge status: Home Health | Payer: Medicare PPO | Attending: Internal Medicine | Admitting: Internal Medicine

## 2018-02-12 DIAGNOSIS — N182 Chronic kidney disease, stage 2 (mild): Secondary | ICD-10-CM | POA: Diagnosis not present

## 2018-02-12 DIAGNOSIS — I739 Peripheral vascular disease, unspecified: Secondary | ICD-10-CM | POA: Diagnosis not present

## 2018-02-12 DIAGNOSIS — N189 Chronic kidney disease, unspecified: Secondary | ICD-10-CM | POA: Insufficient documentation

## 2018-02-12 DIAGNOSIS — R410 Disorientation, unspecified: Secondary | ICD-10-CM | POA: Diagnosis not present

## 2018-02-12 DIAGNOSIS — Z7982 Long term (current) use of aspirin: Secondary | ICD-10-CM | POA: Insufficient documentation

## 2018-02-12 DIAGNOSIS — R55 Syncope and collapse: Principal | ICD-10-CM | POA: Diagnosis present

## 2018-02-12 DIAGNOSIS — Z96652 Presence of left artificial knee joint: Secondary | ICD-10-CM | POA: Diagnosis not present

## 2018-02-12 DIAGNOSIS — E860 Dehydration: Secondary | ICD-10-CM | POA: Insufficient documentation

## 2018-02-12 DIAGNOSIS — J189 Pneumonia, unspecified organism: Secondary | ICD-10-CM | POA: Diagnosis not present

## 2018-02-12 DIAGNOSIS — Z79899 Other long term (current) drug therapy: Secondary | ICD-10-CM | POA: Insufficient documentation

## 2018-02-12 DIAGNOSIS — E7800 Pure hypercholesterolemia, unspecified: Secondary | ICD-10-CM | POA: Diagnosis not present

## 2018-02-12 DIAGNOSIS — J181 Lobar pneumonia, unspecified organism: Secondary | ICD-10-CM | POA: Diagnosis not present

## 2018-02-12 DIAGNOSIS — R197 Diarrhea, unspecified: Secondary | ICD-10-CM | POA: Insufficient documentation

## 2018-02-12 DIAGNOSIS — Z950 Presence of cardiac pacemaker: Secondary | ICD-10-CM | POA: Diagnosis not present

## 2018-02-12 DIAGNOSIS — D649 Anemia, unspecified: Secondary | ICD-10-CM | POA: Diagnosis not present

## 2018-02-12 DIAGNOSIS — N179 Acute kidney failure, unspecified: Secondary | ICD-10-CM

## 2018-02-12 DIAGNOSIS — I959 Hypotension, unspecified: Secondary | ICD-10-CM | POA: Diagnosis not present

## 2018-02-12 DIAGNOSIS — I480 Paroxysmal atrial fibrillation: Secondary | ICD-10-CM | POA: Diagnosis not present

## 2018-02-12 DIAGNOSIS — E785 Hyperlipidemia, unspecified: Secondary | ICD-10-CM | POA: Diagnosis not present

## 2018-02-12 DIAGNOSIS — I1310 Hypertensive heart and chronic kidney disease without heart failure, with stage 1 through stage 4 chronic kidney disease, or unspecified chronic kidney disease: Secondary | ICD-10-CM | POA: Insufficient documentation

## 2018-02-12 DIAGNOSIS — G20 Parkinson's disease: Secondary | ICD-10-CM | POA: Insufficient documentation

## 2018-02-12 DIAGNOSIS — J984 Other disorders of lung: Secondary | ICD-10-CM | POA: Diagnosis not present

## 2018-02-12 DIAGNOSIS — R531 Weakness: Secondary | ICD-10-CM | POA: Diagnosis not present

## 2018-02-12 DIAGNOSIS — E7849 Other hyperlipidemia: Secondary | ICD-10-CM | POA: Diagnosis not present

## 2018-02-12 DIAGNOSIS — I708 Atherosclerosis of other arteries: Secondary | ICD-10-CM | POA: Diagnosis not present

## 2018-02-12 DIAGNOSIS — G250 Essential tremor: Secondary | ICD-10-CM | POA: Diagnosis not present

## 2018-02-12 DIAGNOSIS — I10 Essential (primary) hypertension: Secondary | ICD-10-CM | POA: Diagnosis not present

## 2018-02-12 DIAGNOSIS — R0902 Hypoxemia: Secondary | ICD-10-CM | POA: Diagnosis not present

## 2018-02-12 DIAGNOSIS — R42 Dizziness and giddiness: Secondary | ICD-10-CM | POA: Diagnosis not present

## 2018-02-12 DIAGNOSIS — I38 Endocarditis, valve unspecified: Secondary | ICD-10-CM | POA: Diagnosis not present

## 2018-02-12 LAB — URINALYSIS, ROUTINE W REFLEX MICROSCOPIC
Bacteria, UA: NONE SEEN
Bilirubin Urine: NEGATIVE
Glucose, UA: NEGATIVE mg/dL
Ketones, ur: 5 mg/dL — AB
Leukocytes, UA: NEGATIVE
Nitrite: NEGATIVE
Protein, ur: NEGATIVE mg/dL
Specific Gravity, Urine: 1.019 (ref 1.005–1.030)
pH: 5 (ref 5.0–8.0)

## 2018-02-12 LAB — CBC WITH DIFFERENTIAL/PLATELET
Abs Immature Granulocytes: 0.02 10*3/uL (ref 0.00–0.07)
Basophils Absolute: 0 10*3/uL (ref 0.0–0.1)
Basophils Relative: 0 %
Eosinophils Absolute: 0 10*3/uL (ref 0.0–0.5)
Eosinophils Relative: 1 %
HCT: 36.1 % (ref 36.0–46.0)
HEMOGLOBIN: 11.1 g/dL — AB (ref 12.0–15.0)
Immature Granulocytes: 0 %
LYMPHS PCT: 11 %
Lymphs Abs: 0.5 10*3/uL — ABNORMAL LOW (ref 0.7–4.0)
MCH: 29.1 pg (ref 26.0–34.0)
MCHC: 30.7 g/dL (ref 30.0–36.0)
MCV: 94.5 fL (ref 80.0–100.0)
Monocytes Absolute: 0.5 10*3/uL (ref 0.1–1.0)
Monocytes Relative: 11 %
Neutro Abs: 3.7 10*3/uL (ref 1.7–7.7)
Neutrophils Relative %: 77 %
Platelets: 234 10*3/uL (ref 150–400)
RBC: 3.82 MIL/uL — AB (ref 3.87–5.11)
RDW: 12.6 % (ref 11.5–15.5)
WBC: 4.8 10*3/uL (ref 4.0–10.5)
nRBC: 0 % (ref 0.0–0.2)

## 2018-02-12 LAB — COMPREHENSIVE METABOLIC PANEL
ALT: 7 U/L (ref 0–44)
AST: 23 U/L (ref 15–41)
Albumin: 3.6 g/dL (ref 3.5–5.0)
Alkaline Phosphatase: 43 U/L (ref 38–126)
Anion gap: 14 (ref 5–15)
BUN: 33 mg/dL — ABNORMAL HIGH (ref 8–23)
CO2: 24 mmol/L (ref 22–32)
Calcium: 9.5 mg/dL (ref 8.9–10.3)
Chloride: 95 mmol/L — ABNORMAL LOW (ref 98–111)
Creatinine, Ser: 1.62 mg/dL — ABNORMAL HIGH (ref 0.44–1.00)
GFR calc Af Amer: 32 mL/min — ABNORMAL LOW (ref >60)
GFR calc non Af Amer: 28 mL/min — ABNORMAL LOW (ref >60)
Glucose, Bld: 125 mg/dL — ABNORMAL HIGH (ref 70–99)
Potassium: 3.9 mmol/L (ref 3.5–5.1)
SODIUM: 133 mmol/L — AB (ref 135–145)
Total Bilirubin: 0.3 mg/dL (ref 0.3–1.2)
Total Protein: 9.1 g/dL — ABNORMAL HIGH (ref 6.5–8.1)

## 2018-02-12 LAB — TROPONIN I: Troponin I: 0.03 ng/mL (ref <0.03)

## 2018-02-12 MED ORDER — SODIUM CHLORIDE 0.9 % IV BOLUS
500.0000 mL | Freq: Once | INTRAVENOUS | Status: AC
  Administered 2018-02-13: 500 mL via INTRAVENOUS

## 2018-02-12 MED ORDER — SODIUM CHLORIDE 0.9 % IV BOLUS
500.0000 mL | Freq: Once | INTRAVENOUS | Status: AC
  Administered 2018-02-12: 500 mL via INTRAVENOUS

## 2018-02-12 NOTE — ED Provider Notes (Signed)
[facility]-EMERGENCY DEPT Provider Note   CSN: [id number] Arrival date & time: 02/12/18  1421     History   Chief Complaint No chief complaint on file.   HPI [name] is a 84 y.o. female.  HPI Patient presents after a near syncopal episode.  Had gone to the bathroom and was coming out and began to feel lightheaded.  Reportedly was sweaty and pale.  Little confusion.  Now feels better.  Recent admission to the hospital but is feeling better now.  Has had a good appetite.  No chest pain.  Does have a pacemaker.  No diarrhea.  No constipation.  States she feels better and would actually like to go home. Past Medical History:  Diagnosis Date  . Arthritis   . High cholesterol   . Hypertension   . Paroxysmal atrial fibrillation (HCC)    a. identified on device interrogation 03/2017, all episodes to date <1 minute  . S/P hysterectomy   . Second degree AV block    a. s/p STJ dual chamber PPM Dr Johney FrameAllred  . Syncope     Patient Active Problem List   Diagnosis Date Noted  . Weakness of both legs 01/31/2018  . Elevated troponin 01/31/2018  . Parkinson's disease (HCC) 01/31/2018  . Essential hypertension 01/31/2018  . CAP (community acquired pneumonia) 01/31/2018  . Community acquired pneumonia of right middle lobe of lung (HCC) 01/30/2018  . Near syncope 01/30/2017  . Mild renal insufficiency 01/30/2017  . Hyponatremia 01/30/2017  . Acute colitis 01/30/2017  . Orthostasis 01/30/2017  . Adnexal cyst 01/30/2017  . Bradycardia 11/21/2015  . Complete heart block (HCC) 11/21/2015  . Peripheral vascular disease (HCC) 11/21/2015  . Anemia 11/21/2015  . Hyperlipidemia 07/15/2008  . Hypertensive heart disease     Past Surgical History:  Procedure Laterality Date  . ABDOMINAL HYSTERECTOMY    . EP IMPLANTABLE DEVICE N/A 11/23/2015   SJM Assurity DR pacemaker implanted by Dr Johney FrameAllred for second degree AV block with presyncope  . LOOP RECORDER EXPLANT N/A  03/13/2012   Procedure: LOOP RECORDER EXPLANT;  Surgeon: Marinus MawGregg W Taylor, MD;  [facility];  Service: Cardiovascular;  Laterality: N/A;  . REPLACEMENT TOTAL KNEE Left      OB History    Gravida  0   Para  0   Term  0   Preterm  0   AB  0   Living        SAB  0   TAB  0   Ectopic  0   Multiple      Live Births               Home Medications    Prior to Admission medications   Medication Sig Start Date End Date Taking? Authorizing Provider  aspirin EC 81 MG tablet Take 81 mg by mouth every evening.    Yes [provider]  calcium-vitamin D (OSCAL WITH D) 500-200 MG-UNIT per tablet Take 1 tablet by mouth 2 (two) times daily.   Yes [provider]  carbidopa-levodopa (SINEMET IR) 10-100 MG tablet Take 1 tablet by mouth 2 (two) times daily. 01/02/17  Yes [provider]  docusate sodium (COLACE) 100 MG capsule Take 100 mg by mouth daily as needed for mild constipation.   Yes [provider]  ergocalciferol (VITAMIN D2) 50000 UNITS capsule Take 50,000 Units by mouth every 14 (fourteen) days.   Yes [provider]  losartan-hydrochlorothiazide (HYZAAR) 50-12.5  MG tablet Take 1 tablet by mouth daily with breakfast.  07/21/15  Yes [provider]  NIFEdipine (PROCARDIA-XL/ADALAT-CC/NIFEDICAL-XL) 30 MG 24 hr tablet TAKE 1 TABLET(30 MG) BY MOUTH DAILY Patient taking differently: Take 30 mg by mouth daily.  04/24/17  Yes Allred, Fayrene FearingJames, MD  ondansetron (ZOFRAN) 4 MG tablet Take 1 tablet (4 mg total) by mouth every 6 (six) hours as needed for nausea. 02/01/18  Yes Glade LloydAlekh, Kshitiz, MD  Polyethyl Glycol-Propyl Glycol (SYSTANE) 0.4-0.3 % SOLN Place 1 drop into both eyes 2 (two) times daily.   Yes [provider]  simvastatin (ZOCOR) 20 MG tablet Take 20 mg by mouth at bedtime.  08/02/14  Yes [provider]  vitamin B-12 (CYANOCOBALAMIN) 500 MCG tablet Take 500 mcg by mouth daily with breakfast.   Yes [provider]    Family History Family History  Problem Relation Age of Onset  . Diabetes Other     Social History Social History   Tobacco Use  . Smoking status: Never Smoker  . Smokeless tobacco: Never Used  Substance Use Topics  . Alcohol use: No    Alcohol/week: 0.0 standard drinks  . Drug use: No     Allergies   Patient has no known allergies.   Review of Systems Review of Systems  Constitutional: Positive for diaphoresis. Negative for appetite change.  HENT: Negative for congestion.   Respiratory: Negative for cough and shortness of breath.   Cardiovascular: Negative for chest pain.  Gastrointestinal: Negative for abdominal pain.  Genitourinary: Negative for flank pain.  Musculoskeletal: Negative for back pain.  Skin: Negative for rash.  Neurological: Positive for light-headedness.  Psychiatric/Behavioral: Negative for confusion.     Physical Exam Updated Vital Signs BP (!) 148/89   Pulse 66   Temp (!) 97.5 F (36.4 C)   Resp 19   Ht 5\' 3"  (1.6 m)   Wt 69.9 kg   SpO2 100%   BMI 27.30 kg/m   Physical Exam HENT:     Head: Normocephalic.  Neck:     Musculoskeletal: Neck supple.  Cardiovascular:     Rate and Rhythm: Normal rate and regular rhythm.  Pulmonary:     Breath sounds: No wheezing or rhonchi.  Abdominal:     Tenderness: There is no abdominal tenderness.  Musculoskeletal:        General: No deformity or signs of injury.     Comments: No asymmetric edema  Skin:    General: Skin is warm.     Capillary Refill: Capillary refill takes less than 2 seconds.  Neurological:     General: No focal deficit present.     Mental Status: She is alert and oriented to person, place, and time.      ED Treatments / Results  Labs (all labs ordered are listed, but only abnormal results are displayed) Labs Reviewed  URINALYSIS, ROUTINE W REFLEX MICROSCOPIC - Abnormal; Notable for the following components:      Result Value   Hgb urine dipstick  SMALL (*)    Ketones, ur 5 (*)    All other components within normal limits  TROPONIN I - Abnormal; Notable for the following components:   Troponin I 0.03 (*)    All other components within normal limits  CBC WITH DIFFERENTIAL/PLATELET - Abnormal; Notable for the following components:   RBC 3.82 (*)    Hemoglobin 11.1 (*)    Lymphs Abs 0.5 (*)    All other components within normal limits  COMPREHENSIVE METABOLIC PANEL - Abnormal; Notable for the following components:   Sodium 133 (*)    Chloride 95 (*)    Glucose, Bld 125 (*)    BUN 33 (*)    Creatinine, Ser 1.62 (*)    Total Protein 9.1 (*)    GFR calc non Af Amer 28 (*)    GFR calc Af Amer 32 (*)    All other components within normal limits    EKG EKG Interpretation  Date/Time:  Thursday February 12 2018 14:43:59 EST Ventricular Rate:  63 PR Interval:    QRS Duration: 170 QT Interval:  486 QTC Calculation: 498 R Axis:   -77 Text Interpretation:  A-V dual-paced rhythm with some inhibition No further analysis attempted due to paced rhythm Confirmed by Benjiman Core [phone]) on 02/12/2018 7:48:46 PM   Radiology Dg Chest 2 View  Result Date: 02/12/2018 CLINICAL DATA:  Weakness EXAM: CHEST - 2 VIEW COMPARISON:  01/30/2018 FINDINGS: Bibasilar airspace disease is unchanged. Based on prior studies, there is chronic scarring in the lung bases however this has progressed from prior studies and there could be superimposed pneumonia. Negative for heart failure or effusion.  Pacemaker unchanged. IMPRESSION: Bibasilar airspace disease unchanged. Possible superimposed pneumonia on top of chronic bibasilar fibrosis. Electronically Signed   By: Marlan Palau M.D.   On: 02/12/2018 16:59    Procedures Procedures (including critical care time)  Medications Ordered in ED Medications  sodium chloride 0.9 % bolus 500 mL (500 mLs Intravenous New Bag/Given (Non-Interop) 02/12/18 1837)     Initial Impression / Assessment and Plan /  ED Course  I have reviewed the triage vital signs and the nursing notes.  Pertinent labs & imaging results that were available during my care of the patient were reviewed by me and considered in my medical decision making (see chart for details).     Patient with near syncopal episode.  Happened after going to the bathroom.  Reportedly has been eating and drinking less.  EKG paced.  Pacemaker interrogated and not show cause for this.  Creatinine is increased.  Troponin at baseline.  Not orthostatic.  Patient seems stable for discharge after he had an IV fluid bolus to follow-up outpatient as needed.  Final Clinical Impressions(s) / ED Diagnoses   Final diagnoses:  Near syncope  AKI (acute kidney injury) [facility])    ED Discharge Orders    None       Benjiman Core, MD 02/12/18 1949

## 2018-02-12 NOTE — ED Triage Notes (Signed)
PT BIB EMS from home. Pt had a sudden episode of confusion and weakness while walking to the bathroom. Pt reports that she made it to the bathroom and her daughter helped her sit on toilet. Pt does not feel any weakness at this time and when EMS arrived.  20 LAC 140/56 HR 60 100% RA CBG 202

## 2018-02-12 NOTE — ED Notes (Signed)
Bed: ON62WA19 Expected date:  Expected time:  Means of arrival:  Comments: EMS 231/84 yo female-discharged 45 minutes ago-another syncopal episode-weakness-received IV fluids here-ventricular paced rhythm

## 2018-02-12 NOTE — ED Provider Notes (Signed)
[facility]-EMERGENCY DEPT Provider Note   CSN: [id number] Arrival date & time:        History   Chief Complaint Chief Complaint  Patient presents with  . Near Syncope    HPI [name] is a 84 y.o. female.  HPI Patient presents with a near syncopal episode.  Seen by me and discharged around 45 minutes ago.  Had another episode similar to earlier today.  States she went to the bathroom and then got very weak upon standing.  Difficulty walking and lightheadedness with it.  No chest pain. Past Medical History:  Diagnosis Date  . Arthritis   . High cholesterol   . Hypertension   . Paroxysmal atrial fibrillation (HCC)    a. identified on device interrogation 03/2017, all episodes to date <1 minute  . S/P hysterectomy   . Second degree AV block    a. s/p STJ dual chamber PPM Dr Johney FrameAllred  . Syncope     Patient Active Problem List   Diagnosis Date Noted  . Weakness of both legs 01/31/2018  . Elevated troponin 01/31/2018  . Parkinson's disease (HCC) 01/31/2018  . Essential hypertension 01/31/2018  . CAP (community acquired pneumonia) 01/31/2018  . Community acquired pneumonia of right middle lobe of lung (HCC) 01/30/2018  . Near syncope 01/30/2017  . Mild renal insufficiency 01/30/2017  . Hyponatremia 01/30/2017  . Acute colitis 01/30/2017  . Orthostasis 01/30/2017  . Adnexal cyst 01/30/2017  . Bradycardia 11/21/2015  . Complete heart block (HCC) 11/21/2015  . Peripheral vascular disease (HCC) 11/21/2015  . Anemia 11/21/2015  . Hyperlipidemia 07/15/2008  . Hypertensive heart disease     Past Surgical History:  Procedure Laterality Date  . ABDOMINAL HYSTERECTOMY    . EP IMPLANTABLE DEVICE N/A 11/23/2015   SJM Assurity DR pacemaker implanted by Dr Johney FrameAllred for second degree AV block with presyncope  . LOOP RECORDER EXPLANT N/A 03/13/2012   Procedure: LOOP RECORDER EXPLANT;  Surgeon: Marinus MawGregg W Taylor, MD;  [facility];  Service:  Cardiovascular;  Laterality: N/A;  . REPLACEMENT TOTAL KNEE Left      OB History    Gravida  0   Para  0   Term  0   Preterm  0   AB  0   Living        SAB  0   TAB  0   Ectopic  0   Multiple      Live Births               Home Medications    Prior to Admission medications   Medication Sig Start Date End Date Taking? Authorizing Provider  aspirin EC 81 MG tablet Take 81 mg by mouth every evening.     [provider]  calcium-vitamin D (OSCAL WITH D) 500-200 MG-UNIT per tablet Take 1 tablet by mouth 2 (two) times daily.    [provider]  carbidopa-levodopa (SINEMET IR) 10-100 MG tablet Take 1 tablet by mouth 2 (two) times daily. 01/02/17   [provider]  docusate sodium (COLACE) 100 MG capsule Take 100 mg by mouth daily as needed for mild constipation.    [provider]  ergocalciferol (VITAMIN D2) 50000 UNITS capsule Take 50,000 Units by mouth every 14 (fourteen) days.    [provider]  losartan-hydrochlorothiazide (HYZAAR) 50-12.5 MG tablet Take 1 tablet by mouth daily with breakfast.  07/21/15   [provider]  NIFEdipine (PROCARDIA-XL/ADALAT-CC/NIFEDICAL-XL) 30 MG 24  hr tablet TAKE 1 TABLET(30 MG) BY MOUTH DAILY Patient taking differently: Take 30 mg by mouth daily.  04/24/17   Allred, Fayrene FearingJames, MD  ondansetron (ZOFRAN) 4 MG tablet Take 1 tablet (4 mg total) by mouth every 6 (six) hours as needed for nausea. 02/01/18   Glade LloydAlekh, Kshitiz, MD  Polyethyl Glycol-Propyl Glycol (SYSTANE) 0.4-0.3 % SOLN Place 1 drop into both eyes 2 (two) times daily.    [provider]  simvastatin (ZOCOR) 20 MG tablet Take 20 mg by mouth at bedtime.  08/02/14   [provider]  vitamin B-12 (CYANOCOBALAMIN) 500 MCG tablet Take 500 mcg by mouth daily with breakfast.    [provider]    Family History Family History  Problem Relation Age of Onset  . Diabetes Other     Social History Social History    Tobacco Use  . Smoking status: Never Smoker  . Smokeless tobacco: Never Used  Substance Use Topics  . Alcohol use: No    Alcohol/week: 0.0 standard drinks  . Drug use: No     Allergies   Patient has no known allergies.   Review of Systems Review of Systems  Constitutional: Positive for appetite change.  HENT: Negative for congestion.   Respiratory: Negative for shortness of breath.   Cardiovascular: Negative for chest pain.  Gastrointestinal: Negative for abdominal pain.  Genitourinary: Negative for flank pain.  Musculoskeletal: Negative for back pain.  Skin: Negative for rash.  Neurological: Positive for light-headedness.  Hematological: Negative for adenopathy.  Psychiatric/Behavioral: Negative for confusion.     Physical Exam Updated Vital Signs BP (!) 158/56 (BP Location: Left Arm)   Pulse 70   Temp 97.9 F (36.6 C) (Oral)   Resp 18   SpO2 97%   Physical Exam Vitals signs and nursing note reviewed.  HENT:     Head: Atraumatic.  Eyes:     Pupils: Pupils are equal, round, and reactive to light.  Cardiovascular:     Rate and Rhythm: Normal rate.  Pulmonary:     Breath sounds: No wheezing or rhonchi.  Abdominal:     General: There is no distension.  Musculoskeletal:        General: No signs of injury.  Skin:    General: Skin is warm.  Neurological:     General: No focal deficit present.     Mental Status: She is alert.  Psychiatric:        Mood and Affect: Mood normal.      ED Treatments / Results  Labs (all labs ordered are listed, but only abnormal results are displayed) Labs Reviewed - No data to display  EKG None  Radiology Dg Chest 2 View  Result Date: 02/12/2018 CLINICAL DATA:  Weakness EXAM: CHEST - 2 VIEW COMPARISON:  01/30/2018 FINDINGS: Bibasilar airspace disease is unchanged. Based on prior studies, there is chronic scarring in the lung bases however this has progressed from prior studies and there could be superimposed  pneumonia. Negative for heart failure or effusion.  Pacemaker unchanged. IMPRESSION: Bibasilar airspace disease unchanged. Possible superimposed pneumonia on top of chronic bibasilar fibrosis. Electronically Signed   By: Marlan Palauharles  Clark M.D.   On: 02/12/2018 16:59    Procedures Procedures (including critical care time)  Medications Ordered in ED Medications  sodium chloride 0.9 % bolus 500 mL (has no administration in time range)     Initial Impression / Assessment and Plan / ED Course  I have reviewed the triage vital signs and the  nursing notes.  Pertinent labs & imaging results that were available during my care of the patient were reviewed by me and considered in my medical decision making (see chart for details).     Patient with another episode of near syncope.  With increased creatinine from earlier today and another episode I feel patient benefit for admission to the hospital for likely more fluid hydration.  Did happen again after going to the bathroom.  X-ray had shown bilateral changes with question of pneumonia versus chronic lung disease.  Has not had more coughing or fevers.  Will admit to hospitalist  Final Clinical Impressions(s) / ED Diagnoses   Final diagnoses:  Near syncope  AKI (acute kidney injury) [facility])    ED Discharge Orders    None       Benjiman Core, MD 02/12/18 2204

## 2018-02-12 NOTE — ED Notes (Signed)
Bed: [id number] Expected date:  Expected time:  Means of arrival:  Comments: EMS-84 y/o dizzy

## 2018-02-12 NOTE — Discharge Instructions (Addendum)
Keep yourself hydrated and follow-up with Dr. Eloise HarmanPaterson.

## 2018-02-12 NOTE — ED Triage Notes (Signed)
Pt bib [facility] p being dc'd earlier today. Pt states she got home and got weak upon standing and felt as if she needed to be re-evaluated.

## 2018-02-12 NOTE — H&P (Signed)
History and Physical   [facility] [place] [facility] Admission History and Physical [employer] Anah Billard, D.O.    Patient Name: [name] MR#: [id number] Date of Birth: [birth year] Date of Admission: 02/12/2018  Referring MD/NP/PA: Transfer from [facility] Primary Care Physician: Jarome MatinPaterson, Daniel, MD  Chief Complaint:  Chief Complaint  Patient presents with  . Near Syncope    HPI: [name] is a 84 y.o. female with a known history of arthritis hypertension hyperlipidemia atrial fibrillation with a pacemaker, syncope presents to the emergency department for evaluation of syncope.  Patient was in a usual state of health until last week she was hospitalized for pneumonia.  She has been at home doing physical therapy for the past week and generally feeling well, eating and drinking, ambulating from room to room.  Today patient sustained a syncopal event while using the toilet.  She came to the emergency department was found to have a slight bump in her creatinine, thought to be associated with dehydration.  Her pacemaker was interrogated and the patient was discharged home.  She was on for about 20 minutes when she was using the bathroom and again experienced loss of consciousness momentarily.  Her daughter states that she has been generally weak since her hospital admission but is slowly improving.  She does report watery, foul-smelling diarrhea today..  Patient recalls the events of this afternoon.  Denies any chest pain, shortness of breath.  Denies head trauma, urinary incontinence.  Patient denies fevers/chills, weakness, dizziness, chest pain, shortness of breath, N/V/C/D, abdominal pain, dysuria/frequency, changes in mental status.    Review of Systems:  CONSTITUTIONAL: Positive dizziness, lightheadedness. No fever/chills, fatigue, weakness, weight gain/loss, headache. EYES: No blurry or double vision. ENT: No tinnitus, postnasal drip, redness or soreness of the  oropharynx. RESPIRATORY: No cough, dyspnea, wheeze.  No hemoptysis.  CARDIOVASCULAR: No chest pain, palpitations, syncope, orthopnea. No lower extremity edema.  GASTROINTESTINAL: Some diarrhea.  No nausea, vomiting, abdominal pain, constipation.  No hematemesis, melena or hematochezia. GENITOURINARY: No dysuria, frequency, hematuria. ENDOCRINE: No polyuria or nocturia. No heat or cold intolerance. HEMATOLOGY: No anemia, bruising, bleeding. INTEGUMENTARY: No rashes, ulcers, lesions. MUSCULOSKELETAL: No arthritis, gout. NEUROLOGIC: Positive weakness.  Loss of consciousness.  No numbness, tingling, ataxia, seizure-type activity, weakness. PSYCHIATRIC: No anxiety, depression, insomnia.   Past Medical History:  Diagnosis Date  . Arthritis   . High cholesterol   . Hypertension   . Paroxysmal atrial fibrillation (HCC)    a. identified on device interrogation 03/2017, all episodes to date <1 minute  . S/P hysterectomy   . Second degree AV block    a. s/p STJ dual chamber PPM Dr Johney FrameAllred  . Syncope     Past Surgical History:  Procedure Laterality Date  . ABDOMINAL HYSTERECTOMY    . EP IMPLANTABLE DEVICE N/A 11/23/2015   SJM [employer] DR pacemaker implanted by Dr Johney FrameAllred for second degree AV block with presyncope  . LOOP RECORDER EXPLANT N/A 03/13/2012   Procedure: LOOP RECORDER EXPLANT;  Surgeon: Marinus MawGregg W Taylor, MD;  [facility];  Service: Cardiovascular;  Laterality: N/A;  . REPLACEMENT TOTAL KNEE Left      reports that she has never smoked. She has never used smokeless tobacco. She reports that she does not drink alcohol or use drugs.  No Known Allergies  Family History  Problem Relation Age of Onset  . Diabetes Other     Prior to Admission medications   Medication Sig Start Date End Date Taking?  Authorizing Provider  aspirin EC 81 MG tablet Take 81 mg by mouth every evening.     [provider]  calcium-vitamin D (OSCAL WITH D) 500-200 MG-UNIT per tablet Take 1  tablet by mouth 2 (two) times daily.    [provider]  carbidopa-levodopa (SINEMET IR) 10-100 MG tablet Take 1 tablet by mouth 2 (two) times daily. 01/02/17   [provider]  docusate sodium (COLACE) 100 MG capsule Take 100 mg by mouth daily as needed for mild constipation.    [provider]  ergocalciferol (VITAMIN D2) 50000 UNITS capsule Take 50,000 Units by mouth every 14 (fourteen) days.    [provider]  losartan-hydrochlorothiazide (HYZAAR) 50-12.5 MG tablet Take 1 tablet by mouth daily with breakfast.  07/21/15   [provider]  NIFEdipine (PROCARDIA-XL/ADALAT-CC/NIFEDICAL-XL) 30 MG 24 hr tablet TAKE 1 TABLET(30 MG) BY MOUTH DAILY Patient taking differently: Take 30 mg by mouth daily.  04/24/17   Allred, Fayrene FearingJames, MD  ondansetron (ZOFRAN) 4 MG tablet Take 1 tablet (4 mg total) by mouth every 6 (six) hours as needed for nausea. 02/01/18   Glade LloydAlekh, Kshitiz, MD  Polyethyl Glycol-Propyl Glycol (SYSTANE) 0.4-0.3 % SOLN Place 1 drop into both eyes 2 (two) times daily.    [provider]  simvastatin (ZOCOR) 20 MG tablet Take 20 mg by mouth at bedtime.  08/02/14   [provider]  vitamin B-12 (CYANOCOBALAMIN) 500 MCG tablet Take 500 mcg by mouth daily with breakfast.    [provider]    Physical Exam: Vitals:   02/12/18 2149  BP: (!) 158/56  Pulse: 70  Resp: 18  Temp: 97.9 F (36.6 C)  TempSrc: Oral  SpO2: 97%    GENERAL: 84 y.o.-year-old frail elderly female patient, well-developed, well-nourished lying in the bed in no acute distress.  Pleasant and cooperative.   HEENT: Head atraumatic, normocephalic. Pupils equal. Mucus membranes dry NECK: Supple. No JVD.  No bruits auscultated. CHEST: Normal breath sounds bilaterally. No wheezing, rales, rhonchi or crackles. No use of accessory muscles of respiration.  No reproducible chest wall tenderness.  CARDIOVASCULAR: S1, S2 normal. No murmurs, rubs, or gallops. Cap refill  <2 seconds. Pulses intact distally.  ABDOMEN: Soft, nondistended, nontender. No rebound, guarding, rigidity. Normoactive bowel sounds present in all four quadrants.  EXTREMITIES: No pedal edema, cyanosis, or clubbing. No calf tenderness or Homan's sign.  NEUROLOGIC: The patient is alert and oriented x 3. Cranial nerves II through XII are grossly intact with no focal sensorimotor deficit. PSYCHIATRIC:  Normal affect, mood, thought content. SKIN: Warm, dry, and intact without obvious rash, lesion, or ulcer.    Labs on Admission:  CBC: Recent Labs  Lab 02/12/18 1625  WBC 4.8  NEUTROABS 3.7  HGB 11.1*  HCT 36.1  MCV 94.5  PLT 234   Basic Metabolic Panel: Recent Labs  Lab 02/12/18 1625  NA 133*  K 3.9  CL 95*  CO2 24  GLUCOSE 125*  BUN 33*  CREATININE 1.62*  CALCIUM 9.5   GFR: Estimated Creatinine Clearance: 22.1 mL/min (A) (by C-G formula based on SCr of 1.62 mg/dL (H)). Liver Function Tests: Recent Labs  Lab 02/12/18 1625  AST 23  ALT 7  ALKPHOS 43  BILITOT 0.3  PROT 9.1*  ALBUMIN 3.6   No results for input(s): LIPASE, AMYLASE in the last 168 hours. No results for input(s): AMMONIA in the last 168 hours. Coagulation Profile: No results for input(s): INR, PROTIME in the last 168 hours. Cardiac  Enzymes: Recent Labs  Lab 02/12/18 1625  TROPONINI 0.03*   BNP (last 3 results) No results for input(s): PROBNP in the last 8760 hours. HbA1C: No results for input(s): HGBA1C in the last 72 hours. CBG: No results for input(s): GLUCAP in the last 168 hours. Lipid Profile: No results for input(s): CHOL, HDL, LDLCALC, TRIG, CHOLHDL, LDLDIRECT in the last 72 hours. Thyroid Function Tests: No results for input(s): TSH, T4TOTAL, FREET4, T3FREE, THYROIDAB in the last 72 hours. Anemia Panel: No results for input(s): VITAMINB12, FOLATE, FERRITIN, TIBC, IRON, RETICCTPCT in the last 72 hours. Urine analysis:    Component Value Date/Time   COLORURINE YELLOW 02/12/2018  1840   APPEARANCEUR CLEAR 02/12/2018 1840   LABSPEC 1.019 02/12/2018 1840   PHURINE 5.0 02/12/2018 1840   GLUCOSEU NEGATIVE 02/12/2018 1840   HGBUR SMALL (A) 02/12/2018 1840   BILIRUBINUR NEGATIVE 02/12/2018 1840   KETONESUR 5 (A) 02/12/2018 1840   PROTEINUR NEGATIVE 02/12/2018 1840   UROBILINOGEN 0.2 08/07/2014 1310   NITRITE NEGATIVE 02/12/2018 1840   LEUKOCYTESUR NEGATIVE 02/12/2018 1840   Sepsis Labs: @LABRCNTIP (procalcitonin:4,lacticidven:4) )No results found for this or any previous visit (from the past 240 hour(s)).   Radiological Exams on Admission: Dg Chest 2 View  Result Date: 02/12/2018 CLINICAL DATA:  Weakness EXAM: CHEST - 2 VIEW COMPARISON:  01/30/2018 FINDINGS: Bibasilar airspace disease is unchanged. Based on prior studies, there is chronic scarring in the lung bases however this has progressed from prior studies and there could be superimposed pneumonia. Negative for heart failure or effusion.  Pacemaker unchanged. IMPRESSION: Bibasilar airspace disease unchanged. Possible superimposed pneumonia on top of chronic bibasilar fibrosis. Electronically Signed   By: Marlan Palau M.D.   On: 02/12/2018 16:59    EKG: Paced at 63 bpm with normal axis and nonspecific ST-T wave changes.   Assessment/Plan  This is a 84 y.o. female with a history of arthritis hypertension hyperlipidemia atrial fibrillation with a pacemaker, syncope now being admitted with:  #. Syncope, likely vasovagal - Admit observation with telemetry monitoring - IV fluid hydration - Check orthostatics - Check echo - Trend trops, check TSH, lipids - Consider cardio consult  #. Acute kidney injury on CKD - IV fluids and repeat BMP in AM.  - Avoid nephrotoxic medications hold losartan/hydrochlorthiazide - Bladder scan and place foley catheter if evidence of urinary retention - Check renal US  #.  Diarrhea with recent hospitalization -Check C. difficile  #. History of atrial fibrillation -  Continue Procardia, aspirin  #. History of hyperlipidemia - Continue Zocor  Admission status: Observation IV Fluids: Normal saline Diet/Nutrition: Heart healthy Consults called: None DVT Px: Lovenox, SCDs and early ambulation. Code Status: Full Code  Disposition Plan: To home in 1-2 days  All the records are reviewed and case discussed with ED provider. Management plans discussed with the patient and/or family who express understanding and agree with plan of care.  Liberty Seto D.O. on 02/12/2018 at 11:01 PM CC: Primary care physician; [name], MD   02/12/2018, 11:01 PM

## 2018-02-13 ENCOUNTER — Other Ambulatory Visit (HOSPITAL_COMMUNITY): Payer: Medicare PPO

## 2018-02-13 ENCOUNTER — Observation Stay (HOSPITAL_COMMUNITY): Payer: Medicare PPO

## 2018-02-13 DIAGNOSIS — N179 Acute kidney failure, unspecified: Secondary | ICD-10-CM | POA: Diagnosis not present

## 2018-02-13 DIAGNOSIS — R55 Syncope and collapse: Secondary | ICD-10-CM | POA: Diagnosis not present

## 2018-02-13 DIAGNOSIS — R0989 Other specified symptoms and signs involving the circulatory and respiratory systems: Secondary | ICD-10-CM | POA: Diagnosis not present

## 2018-02-13 LAB — BASIC METABOLIC PANEL
Anion gap: 13 (ref 5–15)
BUN: 29 mg/dL — ABNORMAL HIGH (ref 8–23)
CO2: 23 mmol/L (ref 22–32)
Calcium: 8.8 mg/dL — ABNORMAL LOW (ref 8.9–10.3)
Chloride: 97 mmol/L — ABNORMAL LOW (ref 98–111)
Creatinine, Ser: 1.17 mg/dL — ABNORMAL HIGH (ref 0.44–1.00)
GFR calc Af Amer: 48 mL/min — ABNORMAL LOW (ref >60)
GFR calc non Af Amer: 41 mL/min — ABNORMAL LOW (ref >60)
Glucose, Bld: 96 mg/dL (ref 70–99)
POTASSIUM: 4.2 mmol/L (ref 3.5–5.1)
Sodium: 133 mmol/L — ABNORMAL LOW (ref 135–145)

## 2018-02-13 LAB — MAGNESIUM: Magnesium: 1.7 mg/dL (ref 1.7–2.4)

## 2018-02-13 LAB — CBC
HCT: 31.1 % — ABNORMAL LOW (ref 36.0–46.0)
Hemoglobin: 9.6 g/dL — ABNORMAL LOW (ref 12.0–15.0)
MCH: 29.1 pg (ref 26.0–34.0)
MCHC: 30.9 g/dL (ref 30.0–36.0)
MCV: 94.2 fL (ref 80.0–100.0)
Platelets: 211 10*3/uL (ref 150–400)
RBC: 3.3 MIL/uL — ABNORMAL LOW (ref 3.87–5.11)
RDW: 12.6 % (ref 11.5–15.5)
WBC: 6.6 10*3/uL (ref 4.0–10.5)
nRBC: 0 % (ref 0.0–0.2)

## 2018-02-13 LAB — TROPONIN I
TROPONIN I: 0.03 ng/mL — AB (ref <0.03)
Troponin I: 0.03 ng/mL (ref <0.03)
Troponin I: 0.03 ng/mL (ref <0.03)

## 2018-02-13 LAB — LIPID PANEL
Cholesterol: 106 mg/dL (ref 0–200)
HDL: 36 mg/dL — ABNORMAL LOW (ref >40)
LDL Cholesterol: 61 mg/dL (ref 0–99)
TRIGLYCERIDES: 43 mg/dL (ref <150)
Total CHOL/HDL Ratio: 2.9 RATIO
VLDL: 9 mg/dL (ref 0–40)

## 2018-02-13 LAB — TSH: TSH: 1.56 u[IU]/mL (ref 0.350–4.500)

## 2018-02-13 LAB — PHOSPHORUS: Phosphorus: 3.1 mg/dL (ref 2.5–4.6)

## 2018-02-13 MED ORDER — ALUM & MAG HYDROXIDE-SIMETH 200-200-20 MG/5ML PO SUSP: 30.0000 mL | Freq: Four times a day (QID) | ORAL | Status: DC | PRN

## 2018-02-13 MED ORDER — NIFEDIPINE ER OSMOTIC RELEASE 30 MG PO TB24
30.0000 mg | ORAL_TABLET | Freq: Every day | ORAL | Status: DC
  Administered 2018-02-13 – 2018-02-14 (×2): 30 mg via ORAL
  Filled 2018-02-13 (×2): qty 1

## 2018-02-13 MED ORDER — SODIUM CHLORIDE 0.9 % IV SOLN
INTRAVENOUS | Status: AC
  Administered 2018-02-13 (×2): via INTRAVENOUS

## 2018-02-13 MED ORDER — VITAMIN B-12 1000 MCG PO TABS
500.0000 ug | ORAL_TABLET | Freq: Every day | ORAL | Status: DC
  Administered 2018-02-13 – 2018-02-14 (×2): 500 ug via ORAL
  Filled 2018-02-13: qty 0.5
  Filled 2018-02-13: qty 1

## 2018-02-13 MED ORDER — CARBIDOPA-LEVODOPA 10-100 MG PO TABS
1.0000 | ORAL_TABLET | Freq: Two times a day (BID) | ORAL | Status: DC
  Filled 2018-02-13: qty 1

## 2018-02-13 MED ORDER — GLYCERIN-HYPROMELLOSE-PEG 400 0.2-0.2-1 % OP SOLN
1.0000 [drp] | Freq: Two times a day (BID) | OPHTHALMIC | Status: DC
  Administered 2018-02-13 – 2018-02-14 (×2): 1 [drp] via OPHTHALMIC
  Filled 2018-02-13: qty 15

## 2018-02-13 MED ORDER — DOCUSATE SODIUM 100 MG PO CAPS: 100.0000 mg | ORAL_CAPSULE | Freq: Every day | ORAL | Status: DC | PRN

## 2018-02-13 MED ORDER — ASPIRIN EC 81 MG PO TBEC
81.0000 mg | DELAYED_RELEASE_TABLET | Freq: Every evening | ORAL | Status: DC
  Administered 2018-02-13: 81 mg via ORAL
  Filled 2018-02-13: qty 1

## 2018-02-13 MED ORDER — BISACODYL 5 MG PO TBEC: 5.0000 mg | DELAYED_RELEASE_TABLET | Freq: Every day | ORAL | Status: DC | PRN

## 2018-02-13 MED ORDER — SODIUM CHLORIDE 0.9% FLUSH: 3.0000 mL | Freq: Two times a day (BID) | INTRAVENOUS | Status: DC

## 2018-02-13 MED ORDER — MAGNESIUM CITRATE PO SOLN: 1.0000 | Freq: Once | ORAL | Status: DC | PRN

## 2018-02-13 MED ORDER — ONDANSETRON HCL 4 MG PO TABS: 4.0000 mg | ORAL_TABLET | Freq: Four times a day (QID) | ORAL | Status: DC | PRN

## 2018-02-13 MED ORDER — SIMVASTATIN 20 MG PO TABS
20.0000 mg | ORAL_TABLET | Freq: Every day | ORAL | Status: DC
  Administered 2018-02-13: 20 mg via ORAL
  Filled 2018-02-13: qty 1

## 2018-02-13 MED ORDER — ONDANSETRON HCL 4 MG/2ML IJ SOLN: 4.0000 mg | Freq: Four times a day (QID) | INTRAMUSCULAR | Status: DC | PRN

## 2018-02-13 MED ORDER — CARBIDOPA-LEVODOPA 10-100 MG PO TABS
1.0000 | ORAL_TABLET | Freq: Two times a day (BID) | ORAL | Status: DC
  Administered 2018-02-13: 1 via ORAL
  Filled 2018-02-13 (×2): qty 1

## 2018-02-13 MED ORDER — ACETAMINOPHEN 650 MG RE SUPP: 650.0000 mg | Freq: Four times a day (QID) | RECTAL | Status: DC | PRN

## 2018-02-13 MED ORDER — ACETAMINOPHEN 325 MG PO TABS: 650.0000 mg | ORAL_TABLET | Freq: Four times a day (QID) | ORAL | Status: DC | PRN

## 2018-02-13 MED ORDER — ENOXAPARIN SODIUM 40 MG/0.4ML ~~LOC~~ SOLN
40.0000 mg | SUBCUTANEOUS | Status: DC
  Administered 2018-02-13 – 2018-02-14 (×2): 40 mg via SUBCUTANEOUS
  Filled 2018-02-13 (×2): qty 0.4

## 2018-02-13 MED ORDER — SENNOSIDES-DOCUSATE SODIUM 8.6-50 MG PO TABS: 1.0000 | ORAL_TABLET | Freq: Every evening | ORAL | Status: DC | PRN

## 2018-02-13 MED ORDER — CALCIUM CARBONATE-VITAMIN D 500-200 MG-UNIT PO TABS
1.0000 | ORAL_TABLET | Freq: Two times a day (BID) | ORAL | Status: DC
  Administered 2018-02-13 – 2018-02-14 (×3): 1 via ORAL
  Filled 2018-02-13 (×3): qty 1

## 2018-02-13 NOTE — ED Notes (Signed)
Hygiene performed on pt. Medium sized ball of formed stool noted in pts brief.

## 2018-02-13 NOTE — Evaluation (Signed)
Physical Therapy Evaluation Patient Details Name: Charlies SilversBernell G Dalsanto MRN: [id number] DOB: 05-May-1926 Today's Date: 02/13/2018      History of Present Illness  84 yo female admitted with syncope, AKI. Hx of OA, Afib, pacemaker, syncope, 2* AV block, Parkinson's disease, chronic anemia  Clinical Impression  On eval, pt was Min guard assist for mobility. She walked ~75 feet with a RW. Pt is generally weak and fatigues fairly easily. She denied dizziness/lightheadedness during session. Will follow during hospital stay. Recommend HHPT f/u.     Follow Up Recommendations Home health PT    Equipment Recommendations  Rolling walker with 5" wheels    Recommendations for Other Services       Precautions / Restrictions Precautions Precautions: Fall Restrictions Weight Bearing Restrictions: No      Mobility  Bed Mobility Overal bed mobility: Needs Assistance Bed Mobility: Supine to Sit     Supine to sit: Min guard;HOB elevated     General bed mobility comments: Increased time.   Transfers Overall transfer level: Needs assistance Equipment used: Rolling walker (2 wheeled) Transfers: Sit to/from [employer] CorporationStand;Stand Pivot Transfers Sit to Stand: Min guard Stand pivot transfers: Min guard       General transfer comment:  VCs safety, technique, hand placement. Stand pivot, bed to bsc, with RW. Close guard for safety.  Ambulation/Gait Ambulation/Gait assistance: Min guard Gait Distance (Feet): 75 Feet Assistive device: Rolling walker (2 wheeled) Gait Pattern/deviations: Step-through pattern;Decreased stride length;Trunk flexed     General Gait Details: Close guard for safety. Slow gait speed. Pt reported weakness and fatigue during ambulation.   Stairs            Wheelchair Mobility    Modified Rankin (Stroke Patients Only)       Balance Overall balance assessment: Needs assistance           Standing balance-Leahy Scale: Fair                              Pertinent Vitals/Pain Pain Assessment: No/denies pain    Home Living Family/patient expects to be discharged to:: Private residence Living Arrangements: Children;Other relatives Available Help at Discharge: Family;Available 24 hours/day Type of Home: House Home Access: Stairs to enter Entrance Stairs-Rails: Right Entrance Stairs-Number of Steps: 2 Home Layout: One level Home Equipment: Cane - single point      Prior Function Level of Independence: Independent with assistive device(s)         Comments: uses a cane occasionally     Hand Dominance        Extremity/Trunk Assessment   Upper Extremity Assessment Upper Extremity Assessment: Generalized weakness    Lower Extremity Assessment Lower Extremity Assessment: Generalized weakness       Communication   Communication: No difficulties  Cognition Arousal/Alertness: Awake/alert Behavior During Therapy: WFL for tasks assessed/performed Overall Cognitive Status: Within Functional Limits for tasks assessed                                        General Comments      Exercises     Assessment/Plan    PT Assessment Patient needs continued PT services  PT Problem List Decreased strength;Decreased balance;Decreased mobility;Decreased activity tolerance;Decreased knowledge of use of DME       PT Treatment Interventions DME instruction;Gait training;Functional mobility training;Therapeutic activities;Balance training;Patient/family education;Therapeutic exercise  PT Goals (Current goals can be found in the Care Plan section)  Acute Rehab PT Goals Patient Stated Goal: home PT Goal Formulation: With patient Time For Goal Achievement: 02/27/18 Potential to Achieve Goals: Good    Frequency Min 3X/week   Barriers to discharge        Co-evaluation               AM-PAC PT "6 Clicks" Mobility  Outcome Measure Help needed turning from your back to your side while in a flat bed  without using bedrails?: A Little Help needed moving from lying on your back to sitting on the side of a flat bed without using bedrails?: A Little Help needed moving to and from a bed to a chair (including a wheelchair)?: A Little Help needed standing up from a chair using your arms (e.g., wheelchair or bedside chair)?: A Little Help needed to walk in hospital room?: A Little Help needed climbing 3-5 steps with a railing? : A Little 6 Click Score: 18    End of Session Equipment Utilized During Treatment: Gait belt Activity Tolerance: Patient tolerated treatment well Patient left: in chair;with call bell/phone within reach;with chair alarm set   PT Visit Diagnosis: Muscle weakness (generalized) (M62.81);Difficulty in walking, not elsewhere classified (R26.2);Unsteadiness on feet (R26.81)    Time: 1440-1511 PT Time Calculation (min) (ACUTE ONLY): 31 min   Charges:   PT Evaluation $PT Eval Moderate Complexity: 1 Mod PT Treatments $Gait Training: 8-22 mins          Rebeca AlertJannie Clemons Salvucci, PT Acute Rehabilitation Services Pager: [phone] Office: [phone]

## 2018-02-13 NOTE — ED Notes (Signed)
Pt moved onto inpatient hospital bed for comfort

## 2018-02-13 NOTE — Progress Notes (Signed)
PROGRESS NOTE    [name]  ZOX:[id number] DOB: Jul 15, 1926 DOA: 02/12/2018 PCP: [name], MD    Brief Narrative:  84 y.o. female with a known history of arthritis hypertension hyperlipidemia atrial fibrillation with a pacemaker, syncope presents to the emergency department for evaluation of syncope.  Patient was in a usual state of health until last week she was hospitalized for pneumonia.  She has been at home doing physical therapy for the past week and generally feeling well, eating and drinking, ambulating from room to room.  Today patient sustained a syncopal event while using the toilet.  She came to the emergency department was found to have a slight bump in her creatinine, thought to be associated with dehydration.  Her pacemaker was interrogated and the patient was discharged home.  She was on for about 20 minutes when she was using the bathroom and again experienced loss of consciousness momentarily.  Her daughter states that she has been generally weak since her hospital admission but is slowly improving.  She does report watery, foul-smelling diarrhea today..  Patient recalls the events of this afternoon.  Denies any chest pain, shortness of breath.  Denies head trauma, urinary incontinence.  Patient denies fevers/chills, weakness, dizziness, chest pain, shortness of breath, N/V/C/D, abdominal pain, dysuria/frequency, changes in mental status  Assessment & Plan:   Active Problems:   Near syncope  This is a 84 y.o. female with a history of arthritis hypertension hyperlipidemia atrial fibrillation with a pacemaker, syncope now being admitted with:  #. Syncope, likely secondary to dehydration - Presented with ARF and dry mucus membranes with decreased PO fluid intake prior to visit - Orthostatic on standing after 3 min - continue on IV fluid hydration - Recent 2d echo reviewed, unremarkable. Discontinue order for repeat echo  #. Acute kidney injury on CKD - IV  fluids  - Avoid nephrotoxic medications hold losartan/hydrochlorthiazide - Labs reviewed, renal function improved - Repeat bmet in AM  #.  Diarrhea with recent hospitalization -stools are not watery and in relation to concurrent constipation - Will not check for cdiff  #. History of atrial fibrillation - Continue Procardia, aspirin - Stable at this time  #. History of hyperlipidemia - Continue Zocor. Stable  DVT prophylaxis: Lovenox subQ Code Status: Full Family Communication: Pt in room, family at bedside Disposition Plan: Uncertain at this time  Consultants:     Procedures:     Antimicrobials: Anti-infectives (From admission, onward)   None       Subjective: Feels better this AM, but still complains of weakness  Objective: Vitals:   02/13/18 1100 02/13/18 1155 02/13/18 1256 02/13/18 1325  BP: (!) 131/38 (!) 158/81 (!) 131/48   Pulse: 70 95 69   Resp: (!) 21 19 16    Temp:  98.3 F (36.8 C) 99.1 F (37.3 C)   TempSrc:  Oral Oral   SpO2: 99% 99% 100%   Weight:    66 kg  Height:    5\' 3"  (1.6 m)    Intake/Output Summary (Last 24 hours) at 02/13/2018 1630 Last data filed at 02/13/2018 0106 Gross per 24 hour  Intake 500 ml  Output -  Net 500 ml   Filed Weights   02/13/18 1325  Weight: 66 kg    Examination:  General exam: Appears calm and comfortable  Respiratory system: Clear to auscultation. Respiratory effort normal. Cardiovascular system: S1 & S2 heard, RRR. Gastrointestinal system: Abdomen is nondistended, soft and nontender. No organomegaly or masses felt.  Normal bowel sounds heard. Central nervous system: Alert and oriented. No focal neurological deficits. Extremities: Symmetric 5 x 5 power. Skin: No rashes, lesions Psychiatry: Judgement and insight appear normal. Mood & affect appropriate.   Data Reviewed: I have personally reviewed following labs and imaging studies  CBC: Recent Labs  Lab 02/12/18 1625 02/13/18 0417  WBC 4.8  6.6  NEUTROABS 3.7  --   HGB 11.1* 9.6*  HCT 36.1 31.1*  MCV 94.5 94.2  PLT 234 211   Basic Metabolic Panel: Recent Labs  Lab 02/12/18 1625 02/13/18 0417  NA 133* 133*  K 3.9 4.2  CL 95* 97*  CO2 24 23  GLUCOSE 125* 96  BUN 33* 29*  CREATININE 1.62* 1.17*  CALCIUM 9.5 8.8*  MG  --  1.7  PHOS  --  3.1   GFR: Estimated Creatinine Clearance: 29.7 mL/min (A) (by C-G formula based on SCr of 1.17 mg/dL (H)). Liver Function Tests: Recent Labs  Lab 02/12/18 1625  AST 23  ALT 7  ALKPHOS 43  BILITOT 0.3  PROT 9.1*  ALBUMIN 3.6   No results for input(s): LIPASE, AMYLASE in the last 168 hours. No results for input(s): AMMONIA in the last 168 hours. Coagulation Profile: No results for input(s): INR, PROTIME in the last 168 hours. Cardiac Enzymes: Recent Labs  Lab 02/12/18 1625 02/13/18 0417 02/13/18 1302  TROPONINI 0.03* 0.03* 0.03*   BNP (last 3 results) No results for input(s): PROBNP in the last 8760 hours. HbA1C: No results for input(s): HGBA1C in the last 72 hours. CBG: No results for input(s): GLUCAP in the last 168 hours. Lipid Profile: Recent Labs    02/13/18 0417  CHOL 106  HDL 36*  LDLCALC 61  TRIG 43  CHOLHDL 2.9   Thyroid Function Tests: Recent Labs    02/13/18 0417  TSH 1.560   Anemia Panel: No results for input(s): VITAMINB12, FOLATE, FERRITIN, TIBC, IRON, RETICCTPCT in the last 72 hours. Sepsis Labs: No results for input(s): PROCALCITON, LATICACIDVEN in the last 168 hours.  No results found for this or any previous visit (from the past 240 hour(s)).   Radiology Studies: X-ray Chest Pa And Lateral  Result Date: 02/13/2018 CLINICAL DATA:  Syncope. EXAM: CHEST - 2 VIEW COMPARISON:  02/12/2018 FINDINGS: Cardiac pacemaker. Cardiac enlargement with vascular congestion. Airspace disease in both mid and lower lung zones probably unchanged since prior study given differences in technique. Underlying chronic fibrosis in the bases. No blunting  of costophrenic angles. No pneumothorax. Calcification of the aorta. IMPRESSION: Cardiac enlargement with pulmonary vascular congestion and bilateral mid and lower lung acute and chronic infiltrates similar to previous study. Electronically Signed   By: Burman NievesWilliam  Stevens M.D.   On: 02/13/2018 04:00   Dg Chest 2 View  Result Date: 02/12/2018 CLINICAL DATA:  Weakness EXAM: CHEST - 2 VIEW COMPARISON:  01/30/2018 FINDINGS: Bibasilar airspace disease is unchanged. Based on prior studies, there is chronic scarring in the lung bases however this has progressed from prior studies and there could be superimposed pneumonia. Negative for heart failure or effusion.  Pacemaker unchanged. IMPRESSION: Bibasilar airspace disease unchanged. Possible superimposed pneumonia on top of chronic bibasilar fibrosis. Electronically Signed   By: Marlan Palauharles  Clark M.D.   On: 02/12/2018 16:59    Scheduled Meds: . aspirin EC  81 mg Oral QPM  . calcium-vitamin D  1 tablet Oral BID  . [START ON 02/14/2018] carbidopa-levodopa  1 tablet Oral BID WC  . enoxaparin (LOVENOX) injection  40 mg  Subcutaneous Q24H  . Glycerin-Hypromellose-PEG 400  1 drop Both Eyes BID  . NIFEdipine  30 mg Oral Daily  . simvastatin  20 mg Oral QHS  . sodium chloride flush  3 mL Intravenous Q12H  . vitamin B-12  500 mcg Oral Q breakfast   Continuous Infusions: . sodium chloride 75 mL/hr at 02/13/18 0445   Time spent: 30 min   LOS: 0 days   Rickey BarbaraStephen Chiu, MD [facility] Pager On Amion  If 7PM-7AM, please contact night-coverage 02/13/2018, 4:30 PM

## 2018-02-13 NOTE — ED Notes (Signed)
ED TO INPATIENT HANDOFF REPORT  Name/Age/Gender [name] 84 y.o. female  Code Status    Code Status Orders  (From admission, onward)         Start     Ordered   02/13/18 0339  Full code  Continuous     02/13/18 0338        Code Status History    Date Active Date Inactive Code Status Order ID Comments User Context   01/31/2018 0328 02/01/2018 1808 Full Code [id number]  Eduard ClosKakrakandy, Arshad N, MD Inpatient   01/30/2017 0444 01/31/2017 1920 Full Code [id number]  Briscoe Deutscherpyd, Timothy S, MD ED   11/21/2015 2018 11/23/2015 1542 Full Code [id number]  Othella Boyerilley, William S, MD Inpatient   08/07/2014 1752 08/08/2014 2113 Full Code [id number]  Penny PiaVega, Orlando, MD Inpatient    Advance Directive Documentation     Most Recent Value  Type of Advance Directive  Living will  Pre-existing out of facility DNR order (yellow form or pink MOST form)  --  "MOST" Form in Place?  --      Home/SNF/Other Home  Chief Complaint sncope   Level of Care/Admitting Diagnosis ED Disposition    ED Disposition Condition Comment   Admit  [facility] [[id number]]  Level of Care: Telemetry [5]  Admit to tele based on following criteria: Eval of Syncope  Diagnosis: Near syncope [692301]  Admitting Physician: Tonye RoyaltyHUGELMEYER, ALEXIS [[id number]]  Attending Physician: Tonye RoyaltyHUGELMEYER, ALEXIS [[id number]]  PT Class (Do Not Modify): Observation [104]  PT Acc Code (Do Not Modify): Observation [10022]       Medical History Past Medical History:  Diagnosis Date  . Arthritis   . High cholesterol   . Hypertension   . Paroxysmal atrial fibrillation (HCC)    a. identified on device interrogation 03/2017, all episodes to date <1 minute  . S/P hysterectomy   . Second degree AV block    a. s/p STJ dual chamber PPM Dr Johney FrameAllred  . Syncope     Allergies No Known Allergies  IV Location/Drains/Wounds Patient Lines/Drains/Airways Status   Active Line/Drains/Airways    Name:   Placement date:   Placement  time:   Site:   Days:   Peripheral IV 02/12/18   02/12/18    2239    --   1   Peripheral IV 02/12/18 Right;Posterior Wrist   02/12/18    2346    Wrist   1   External Urinary Catheter   02/13/18    0215    --   less than 1          Labs/Imaging Results for orders placed or performed during the hospital encounter of 02/12/18 (from the past 48 hour(s))  TSH     Status: None   Collection Time: 02/13/18  4:17 AM  Result Value Ref Range   TSH 1.560 0.350 - 4.500 uIU/mL    Comment: Performed by a 3rd Generation assay with a functional sensitivity of <=0.01 uIU/mL. Performed at [facility], [address] [place], [name] [address]   Lipid panel     Status: Abnormal   Collection Time: 02/13/18  4:17 AM  Result Value Ref Range   Cholesterol 106 0 - 200 mg/dL   Triglycerides 43 <638<150 mg/dL   HDL 36 (L) >75>40 mg/dL   Total CHOL/HDL Ratio 2.9 RATIO   VLDL 9 0 - 40 mg/dL   LDL Cholesterol 61 0 - 99 mg/dL    Comment:  Total Cholesterol/HDL:CHD Risk Coronary Heart Disease Risk Table                     Men   Women  1/2 Average Risk   3.4   3.3  Average Risk       5.0   4.4  2 X Average Risk   9.6   7.1  3 X Average Risk  23.4   11.0        Use the calculated Patient Ratio above and the CHD Risk Table to determine the patient's CHD Risk.        ATP III CLASSIFICATION (LDL):  <100     mg/dL   Optimal  161-096  mg/dL   Near or Above                    Optimal  130-159  mg/dL   Borderline  045-409  mg/dL   High  >811     mg/dL   Very High Performed at [facility], [address], [place], [place] [address]   Magnesium     Status: None   Collection Time: 02/13/18  4:17 AM  Result Value Ref Range   Magnesium 1.7 1.7 - 2.4 mg/dL    Comment: Performed at [facility], [address] [place], [place] [address]  Phosphorus     Status: None   Collection Time: 02/13/18  4:17 AM  Result Value Ref Range   Phosphorus 3.1 2.5 -  4.6 mg/dL    Comment: Performed at [facility], [address] [place], [place] [address]  Basic metabolic panel     Status: Abnormal   Collection Time: 02/13/18  4:17 AM  Result Value Ref Range   Sodium 133 (L) 135 - 145 mmol/L   Potassium 4.2 3.5 - 5.1 mmol/L   Chloride 97 (L) 98 - 111 mmol/L   CO2 23 22 - 32 mmol/L   Glucose, Bld 96 70 - 99 mg/dL   BUN 29 (H) 8 - 23 mg/dL   Creatinine, Ser 5.78 (H) 0.44 - 1.00 mg/dL   Calcium 8.8 (L) 8.9 - 10.3 mg/dL   GFR calc non Af Amer 41 (L) >60 mL/min   GFR calc Af Amer 48 (L) >60 mL/min   Anion gap 13 5 - 15    Comment: Performed at [facility], [address] [place], [place] [address]  CBC     Status: Abnormal   Collection Time: 02/13/18  4:17 AM  Result Value Ref Range   WBC 6.6 4.0 - 10.5 K/uL   RBC 3.30 (L) 3.87 - 5.11 MIL/uL   Hemoglobin 9.6 (L) 12.0 - 15.0 g/dL   HCT 95.2 (L) 84.1 - 32.4 %   MCV 94.2 80.0 - 100.0 fL   MCH 29.1 26.0 - 34.0 pg   MCHC 30.9 30.0 - 36.0 g/dL   RDW 40.1 02.7 - 25.3 %   Platelets 211 150 - 400 K/uL   nRBC 0.0 0.0 - 0.2 %    Comment: Performed at [facility], [address] [place], [place] [address]  Troponin I - Now Then Q6H     Status: Abnormal   Collection Time: 02/13/18  4:17 AM  Result Value Ref Range   Troponin I 0.03 (HH) <0.03 ng/mL    Comment: CRITICAL VALUE NOTED.  VALUE IS CONSISTENT WITH PREVIOUSLY REPORTED AND CALLED VALUE. Performed at [facility],  [address] [place], [name] [address]    X-ray Chest Pa And Lateral  Result Date: 02/13/2018 CLINICAL DATA:  Syncope. EXAM: CHEST - 2 VIEW COMPARISON:  02/12/2018 FINDINGS: Cardiac pacemaker. Cardiac enlargement with vascular congestion. Airspace disease in both mid and lower lung zones probably unchanged since prior study given differences in technique. Underlying chronic fibrosis in the bases. No blunting of costophrenic angles. No pneumothorax.  Calcification of the aorta. IMPRESSION: Cardiac enlargement with pulmonary vascular congestion and bilateral mid and lower lung acute and chronic infiltrates similar to previous study. Electronically Signed   By: Burman NievesWilliam  Stevens M.D.   On: 02/13/2018 04:00   Dg Chest 2 View  Result Date: 02/12/2018 CLINICAL DATA:  Weakness EXAM: CHEST - 2 VIEW COMPARISON:  01/30/2018 FINDINGS: Bibasilar airspace disease is unchanged. Based on prior studies, there is chronic scarring in the lung bases however this has progressed from prior studies and there could be superimposed pneumonia. Negative for heart failure or effusion.  Pacemaker unchanged. IMPRESSION: Bibasilar airspace disease unchanged. Possible superimposed pneumonia on top of chronic bibasilar fibrosis. Electronically Signed   By: Marlan Palauharles  Clark M.D.   On: 02/12/2018 16:59   None  Pending Labs Unresulted Labs (From admission, onward)    Start     Ordered   02/19/18 0500  Creatinine, serum  (enoxaparin (LOVENOX)    CrCl >/= 30 ml/min)  Weekly,   R    Comments:  while on enoxaparin therapy    02/13/18 0338   02/13/18 0338  Troponin I - Now Then Q6H  Now then every 6 hours,   R     02/13/18 0338   02/13/18 0338  C difficile quick scan w PCR reflex  (C Difficile quick screen w PCR reflex panel)  Once, for 24 hours,   R     02/13/18 0337          Vitals/Pain Today's Vitals   02/13/18 0700 02/13/18 0800 02/13/18 0900 02/13/18 1000  BP: (!) 145/47 (!) 146/46 (!) 140/43 (!) 130/42  Pulse: 71 69 66 64  Resp: (!) 22 (!) 22 18 13   Temp:      TempSrc:      SpO2: 100% 100% 100% 100%  PainSc:        Isolation Precautions Enteric precautions (UV disinfection)  Medications Medications  aspirin EC tablet 81 mg (has no administration in time range)  NIFEdipine (PROCARDIA-XL/NIFEDICAL-XL) 24 hr tablet 30 mg (30 mg Oral Given 02/13/18 1118)  simvastatin (ZOCOR) tablet 20 mg (has no administration in time range)  docusate sodium (COLACE)  capsule 100 mg (has no administration in time range)  vitamin B-12 (CYANOCOBALAMIN) tablet 500 mcg (500 mcg Oral Given 02/13/18 1118)  carbidopa-levodopa (SINEMET IR) 10-100 MG per tablet immediate release 1 tablet (1 tablet Oral Given 02/13/18 1117)  calcium-vitamin D (OSCAL WITH D) 500-200 MG-UNIT per tablet 1 tablet (1 tablet Oral Given 02/13/18 1117)  Glycerin-Hypromellose-PEG 400 0.2-0.2-1 % SOLN 1 drop (1 drop Both Eyes Given 02/13/18 1119)  enoxaparin (LOVENOX) injection 40 mg (40 mg Subcutaneous Given 02/13/18 1117)  sodium chloride flush (NS) 0.9 % injection 3 mL (3 mLs Intravenous Not Given 02/13/18 1108)  0.9 %  sodium chloride infusion ( Intravenous New Bag/Given 02/13/18 0445)  acetaminophen (TYLENOL) tablet 650 mg (has no administration in time range)    Or  acetaminophen (TYLENOL) suppository 650 mg (has no administration in time range)  senna-docusate (Senokot-S) tablet 1 tablet (has no administration in time range)  bisacodyl (DULCOLAX)  EC tablet 5 mg (has no administration in time range)  magnesium citrate solution 1 Bottle (has no administration in time range)  ondansetron (ZOFRAN) tablet 4 mg (has no administration in time range)    Or  ondansetron (ZOFRAN) injection 4 mg (has no administration in time range)  alum & mag hydroxide-simeth (MAALOX/MYLANTA) 200-200-20 MG/5ML suspension 30 mL (has no administration in time range)  sodium chloride 0.9 % bolus 500 mL (0 mLs Intravenous Stopped 02/13/18 0106)    Mobility non-ambulatory

## 2018-02-13 NOTE — Progress Notes (Signed)
[facility]  Patient Status: Active (receiving services up to time of hospitalization)  [facility] is providing the following services: PT and OT  If patient discharges after hours, please call [phone].   Avie EchevariaKaren Nussbaum 02/13/2018, 11:48 AM

## 2018-02-14 DIAGNOSIS — N179 Acute kidney failure, unspecified: Secondary | ICD-10-CM | POA: Diagnosis not present

## 2018-02-14 DIAGNOSIS — R55 Syncope and collapse: Secondary | ICD-10-CM | POA: Diagnosis not present

## 2018-02-14 LAB — BASIC METABOLIC PANEL
ANION GAP: 8 (ref 5–15)
BUN: 20 mg/dL (ref 8–23)
CO2: 23 mmol/L (ref 22–32)
Calcium: 8.4 mg/dL — ABNORMAL LOW (ref 8.9–10.3)
Chloride: 103 mmol/L (ref 98–111)
Creatinine, Ser: 0.97 mg/dL (ref 0.44–1.00)
GFR calc Af Amer: >60 mL/min (ref >60)
GFR calc non Af Amer: 52 mL/min — ABNORMAL LOW (ref >60)
Glucose, Bld: 88 mg/dL (ref 70–99)
POTASSIUM: 3.8 mmol/L (ref 3.5–5.1)
Sodium: 134 mmol/L — ABNORMAL LOW (ref 135–145)

## 2018-02-14 LAB — GLUCOSE, CAPILLARY: Glucose-Capillary: 84 mg/dL (ref 70–99)

## 2018-02-14 NOTE — Progress Notes (Signed)
Went over discharge papers with patient and grand daughter.  All questions answered.  VSS.  AVS given to patient.  Wheeled out via NT.

## 2018-02-14 NOTE — Care Management Obs Status (Signed)
MEDICARE OBSERVATION STATUS NOTIFICATION   Patient Details  Name: Charlies SilversBernell G Ledesma MRN: [id number] Date of Birth: 1926-07-14   Medicare Observation Status Notification Given:  Yes    Elliot CousinShavis, Joshaua Epple Ellen, RN 02/14/2018, 10:33 AM

## 2018-02-14 NOTE — Care Management Note (Signed)
Case Management Note  Patient Details  Name: Charlies SilversBernell G Schwark MRN: [id number] Date of Birth: 1926/12/21  Subjective/Objective:  Syncope, dehydration                 Action/Plan: NCM spoke to pt and grand-dtr, Katrice at bedside. Pt active with [facility] for HH. Placed CMS list on chart/provide to pt. Contacted [facility] to make aware of dc home. Pt has RW, Rollator, and cane. Grand-dtr requesting 3n1 bedside commode. Contacted [facility] for 3n1 bedside commode.   Expected Discharge Date:  02/14/18               Expected Discharge Plan:  Home w Home Health Services  In-House Referral:  NA  Discharge planning Services  CM Consult  Post Acute Care Choice:  Home Health Choice offered to:  Patient  DME Arranged:  N/A DME Agency:  NA  HH Arranged:  PT HH Agency:  [facility]  Status of Service:  Completed, signed off  If discussed at Long Length of Stay Meetings, dates discussed:    Additional Comments:  Elliot CousinShavis, Maika Kaczmarek Ellen, RN 02/14/2018, 10:53 AM

## 2018-02-14 NOTE — Discharge Instructions (Signed)
Please resume losartan on 12/23

## 2018-02-14 NOTE — Discharge Summary (Signed)
Physician Discharge Summary  [name] ZOX:[id number] DOB: [birth year] DOA: 02/12/2018  PCP: Jarome MatinPaterson, Daniel, MD  Admit date: 02/12/2018 Discharge date: 02/14/2018  Admitted From: Home Disposition:  Home  Recommendations for Outpatient Follow-up:  1. Follow up with PCP in 1-2 weeks 2. Recommend repeat BMET within one week, PCP to follow results  Discharge Condition:Improved CODE STATUS:Full Diet recommendation: Regular   Brief/Interim Summary: 84 y.o.femalewith a known history of arthritis hypertension hyperlipidemia atrial fibrillation with a pacemaker, syncopepresents to the emergency department for evaluation of syncope. Patient was in a usual state of health until last week she was hospitalized for pneumonia. She has been at home doing physical therapy for the past week and generally feeling well, eating and drinking, ambulating from room to room. Today patient sustained a syncopal event while using the toilet. She came to the emergency department was found to have a slight bump in her creatinine, thought to be associated with dehydration. Her pacemaker was interrogated and the patient was discharged home. She was on for about 20 minutes when she was using the bathroom and again experienced loss of consciousness momentarily. Her daughter states that she has been generally weak since her hospital admission but is slowly improving. She does report watery, foul-smelling diarrhea today..Patient recalls the events of this afternoon. Denies any chest pain, shortness of breath. Denies head trauma, urinary incontinence.  Patient denies fevers/chills, weakness, dizziness, chest pain, shortness of breath, N/V/C/D, abdominal pain, dysuria/frequency, changes in mental status   Discharge Diagnoses:  Active Problems:   Near syncope  #.Syncope,likely secondary to dehydration - Presented with ARF and dry mucus membranes with decreased PO fluid intake prior to visit -  Orthostatic on standing after 3 min - Improved with IV fluid hydration - Recent 2d echo reviewed, unremarkable. Discontinued order for repeat echo - remained stable this hospital course  #.Acute kidney injuryon CKD - given IV fluids   - Avoided nephrotoxic medicationshold losartan/hydrochlorthiazide while in hospital - Labs reviewed, renal function improved with IVF  #.Diarrhea with recent hospitalization -stools are not watery and in relation to concurrent constipation - Will not check for cdiff - Improved  #. History ofatrial fibrillation - ContinueProcardia, aspirin - Remained stable  #. History ofhyperlipidemia - ContinueZocor. Stable   Discharge Instructions   Allergies as of 02/14/2018   No Known Allergies     Medication List    TAKE these medications   aspirin EC 81 MG tablet Take 81 mg by mouth every evening.   calcium-vitamin D 500-200 MG-UNIT tablet Commonly known as:  OSCAL WITH D Take 1 tablet by mouth 2 (two) times daily.   carbidopa-levodopa 10-100 MG tablet Commonly known as:  SINEMET IR Take 1 tablet by mouth 2 (two) times daily.   docusate sodium 100 MG capsule Commonly known as:  COLACE Take 100 mg by mouth daily as needed for mild constipation.   ergocalciferol 1.25 MG (50000 UT) capsule Commonly known as:  VITAMIN D2 Take 50,000 Units by mouth every 14 (fourteen) days.   losartan-hydrochlorothiazide 50-12.5 MG tablet Commonly known as:  HYZAAR Take 1 tablet by mouth daily with breakfast.   NIFEdipine 30 MG 24 hr tablet Commonly known as:  PROCARDIA-XL/NIFEDICAL-XL TAKE 1 TABLET(30 MG) BY MOUTH DAILY What changed:  See the new instructions.   ondansetron 4 MG tablet Commonly known as:  ZOFRAN Take 1 tablet (4 mg total) by mouth every 6 (six) hours as needed for nausea.   simvastatin 20 MG tablet Commonly known as:  ZOCOR  Take 20 mg by mouth at bedtime.   SYSTANE 0.4-0.3 % Soln Generic drug:  Polyethyl Glycol-Propyl  Glycol Place 1 drop into both eyes 2 (two) times daily.   vitamin B-12 500 MCG tablet Commonly known as:  CYANOCOBALAMIN Take 500 mcg by mouth daily with breakfast.      Follow-up Information    [name], MD. Schedule an appointment as soon as possible for a visit in 2 week(s).   Specialty:  Internal Medicine Contact information: [address] [place] [address] [phone]          No Known Allergies   Procedures/Studies: Ct Angio Head W Or Wo Contrast  Result Date: 01/30/2018 CLINICAL DATA:  Initial evaluation for acute sudden onset weakness. EXAM: CT ANGIOGRAPHY HEAD AND NECK TECHNIQUE: Multidetector CT imaging of the head and neck was performed using the standard protocol during bolus administration of intravenous contrast. Multiplanar CT image reconstructions and MIPs were obtained to evaluate the vascular anatomy. Carotid stenosis measurements (when applicable) are obtained utilizing NASCET criteria, using the distal internal carotid diameter as the denominator. CONTRAST:  ISOVUE-370 IOPAMIDOL (ISOVUE-370) INJECTION 76% COMPARISON:  Prior head CT from earlier the same day. FINDINGS: CTA NECK FINDINGS Aortic arch: Visualized aortic arch of normal caliber with normal branch pattern. Extensive atherosclerotic change present within the visualized arch and about the origin of the great vessels. Associated stenosis of up to 40% at the origin of the left subclavian artery. Additional mild stenosis of approximately 20% at the proximal right subclavian artery just distal to the takeoff of the right vertebral artery. Visualized subclavian arteries otherwise widely patent. Right carotid system: Right common carotid artery tortuous proximally but widely patent to the bifurcation. Mixed plaque about the right bifurcation/proximal right ICA with associated stenosis of up to 45% by NASCET criteria. Right ICA widely patent distally to the skull base without stenosis, dissection,  or occlusion. Left carotid system: Left common carotid artery tortuous proximally but widely patent to the bifurcation. Mixed plaque at the left bifurcation with associated of up to 40% by NASCET criteria. Left ICA otherwise patent distally to the skull base without stenosis, dissection, or occlusion. Vertebral arteries: Left vertebral artery arises separately from the aortic arch. Right vertebral artery rises from the right subclavian artery. Plaque at the origin of the vertebral arteries bilaterally with associated fairly severe stenosis of approximately 80% at the origin of the left vertebral artery. Associated stenosis of up to 70% at the origin of the right vertebral artery. The right vertebral artery is dominant. Vertebral arteries otherwise patent within the neck without stenosis, dissection, or occlusion. Skeleton: No acute osseous abnormality. No discrete lytic or blastic osseous lesions. Mild-to-moderate multilevel cervical spondylolysis, most notable at C5-6. Patient is edentulous. Other neck: No other acute soft tissue abnormality within the neck. Salivary glands within normal limits. Thyroid normal. No pathologically enlarged lymph nodes. Upper chest: Visualized upper chest demonstrates no acute finding. Changes compatible with mild pulmonary interstitial edema present within the visualized lungs. Review of the MIP images confirms the above findings CTA HEAD FINDINGS Anterior circulation: Petrous segments patent bilaterally. Scattered calcified atheromatous plaque within the cavernous/supraclinoid ICAs bilaterally. Relatively mild multifocal narrowing on the left. There is focal moderate stenosis at the proximal cavernous right ICA (series 7, image 115). ICA termini well perfused bilaterally. A1 segments patent bilaterally. Right A1 hypoplastic. Subtle 2 mm focal outpouching arising from the anterior communicating artery suspicious for a tiny aneurysm (series 8, image 113). Anterior cerebral arteries  patent  to their distal aspects without flow-limiting stenosis. M1 segments patent bilaterally without stenosis. Normal MCA bifurcations. Distal MCA branches well perfused and symmetric. Posterior circulation: Multifocal calcified plaque within the dominant right V4 segment with mild segmental tandem stenoses. Mild non stenotic plaque within the hypoplastic left vertebral artery without significant stenosis. Posterior inferior cerebral arteries patent bilaterally. Basilar mildly tortuous with short fenestration at its mid-distal aspect. No focal stenosis seen involving the basilar. Superior cerebral arteries irregular but widely patent bilaterally. Both of the posterior cerebral arteries primarily supplied via the basilar and are well perfused to their distal aspects without stenosis. Small bilateral posterior communicating arteries noted. Venous sinuses: Patent. Anatomic variants: None significant. Delayed phase: No abnormal enhancement. Review of the MIP images confirms the above findings IMPRESSION: 1. Negative CTA for emergent large vessel occlusion. 2. Atheromatous plaque at the origin of the vertebral arteries bilaterally with severe approximate 80% stenosis on the left and 70% on the right. Additional short-segment mild tandem right V4 stenoses. Vertebrobasilar system otherwise widely patent. 3. Atheromatous stenoses about the carotid bifurcations measuring up to 45% on the right and 40% on the left. 4. 2 mm focal outpouching arising from the anterior communicating artery, suspicious for a small aneurysm. 5. Mild diffuse interlobular septal thickening within the visualized lungs, suggesting mild pulmonary interstitial edema. Electronically Signed   By: Rise Mu M.D.   On: 01/30/2018 23:42   X-ray Chest Pa And Lateral  Result Date: 02/13/2018 CLINICAL DATA:  Syncope. EXAM: CHEST - 2 VIEW COMPARISON:  02/12/2018 FINDINGS: Cardiac pacemaker. Cardiac enlargement with vascular congestion. Airspace  disease in both mid and lower lung zones probably unchanged since prior study given differences in technique. Underlying chronic fibrosis in the bases. No blunting of costophrenic angles. No pneumothorax. Calcification of the aorta. IMPRESSION: Cardiac enlargement with pulmonary vascular congestion and bilateral mid and lower lung acute and chronic infiltrates similar to previous study. Electronically Signed   By: Burman Nieves M.D.   On: 02/13/2018 04:00   Dg Chest 2 View  Result Date: 02/12/2018 CLINICAL DATA:  Weakness EXAM: CHEST - 2 VIEW COMPARISON:  01/30/2018 FINDINGS: Bibasilar airspace disease is unchanged. Based on prior studies, there is chronic scarring in the lung bases however this has progressed from prior studies and there could be superimposed pneumonia. Negative for heart failure or effusion.  Pacemaker unchanged. IMPRESSION: Bibasilar airspace disease unchanged. Possible superimposed pneumonia on top of chronic bibasilar fibrosis. Electronically Signed   By: Marlan Palau M.D.   On: 02/12/2018 16:59   Ct Head Wo Contrast  Result Date: 01/31/2018 CLINICAL DATA:  84 y/o  F; bilateral lower extremity weakness. EXAM: CT HEAD WITHOUT CONTRAST TECHNIQUE: Contiguous axial images were obtained from the base of the skull through the vertex without intravenous contrast. COMPARISON:  01/30/2018 CT head and CTA. FINDINGS: Brain: No evidence of acute infarction, hemorrhage, hydrocephalus, extra-axial collection or mass lesion/mass effect. Nonspecific white matter hypodensities compatible with mild chronic microvascular ischemic changes. Mild volume loss of the brain. Vascular: Calcific atherosclerosis of the carotid siphons and vertebral arteries. No hyperdense vessel identified. Skull: Normal. Negative for fracture or focal lesion. Sinuses/Orbits: No acute finding. Other: None. IMPRESSION: 1. No acute intracranial abnormality identified. 2. Mild chronic microvascular ischemic changes and volume  loss of the brain for age. Electronically Signed   By: Mitzi Hansen M.D.   On: 01/31/2018 21:28   Ct Head Wo Contrast  Result Date: 01/30/2018 CLINICAL DATA:  Pt BIB [facility] from home. Pt had sudden  onset weakness while walking back to her room from her bathroom. Pt has bilateral edema she stated that her right is normally edematous but the left leg is new. Pt is not able to walk current.*comment was truncated*Focal neuro deficit, > 6 hrs, stroke suspected EXAM: CT HEAD WITHOUT CONTRAST TECHNIQUE: Contiguous axial images were obtained from the base of the skull through the vertex without intravenous contrast. COMPARISON:  None. FINDINGS: Brain: No acute intracranial hemorrhage. No focal mass lesion. No CT evidence of acute infarction. No midline shift or mass effect. No hydrocephalus. Basilar cisterns are patent. Minimal periventricular white matter hypodensities. Vascular: No hyperdense vessel or unexpected calcification. Skull: Normal. Negative for fracture or focal lesion. Sinuses/Orbits: Paranasal sinuses and mastoid air cells are clear. Orbits are clear. Other: None. IMPRESSION: Normal head CT for age. Electronically Signed   By: Genevive Bi M.D.   On: 01/30/2018 20:07   Ct Angio Neck W And/or Wo Contrast  Result Date: 01/30/2018 CLINICAL DATA:  Initial evaluation for acute sudden onset weakness. EXAM: CT ANGIOGRAPHY HEAD AND NECK TECHNIQUE: Multidetector CT imaging of the head and neck was performed using the standard protocol during bolus administration of intravenous contrast. Multiplanar CT image reconstructions and MIPs were obtained to evaluate the vascular anatomy. Carotid stenosis measurements (when applicable) are obtained utilizing NASCET criteria, using the distal internal carotid diameter as the denominator. CONTRAST:  ISOVUE-370 IOPAMIDOL (ISOVUE-370) INJECTION 76% COMPARISON:  Prior head CT from earlier the same day. FINDINGS: CTA NECK FINDINGS Aortic arch: Visualized  aortic arch of normal caliber with normal branch pattern. Extensive atherosclerotic change present within the visualized arch and about the origin of the great vessels. Associated stenosis of up to 40% at the origin of the left subclavian artery. Additional mild stenosis of approximately 20% at the proximal right subclavian artery just distal to the takeoff of the right vertebral artery. Visualized subclavian arteries otherwise widely patent. Right carotid system: Right common carotid artery tortuous proximally but widely patent to the bifurcation. Mixed plaque about the right bifurcation/proximal right ICA with associated stenosis of up to 45% by NASCET criteria. Right ICA widely patent distally to the skull base without stenosis, dissection, or occlusion. Left carotid system: Left common carotid artery tortuous proximally but widely patent to the bifurcation. Mixed plaque at the left bifurcation with associated of up to 40% by NASCET criteria. Left ICA otherwise patent distally to the skull base without stenosis, dissection, or occlusion. Vertebral arteries: Left vertebral artery arises separately from the aortic arch. Right vertebral artery rises from the right subclavian artery. Plaque at the origin of the vertebral arteries bilaterally with associated fairly severe stenosis of approximately 80% at the origin of the left vertebral artery. Associated stenosis of up to 70% at the origin of the right vertebral artery. The right vertebral artery is dominant. Vertebral arteries otherwise patent within the neck without stenosis, dissection, or occlusion. Skeleton: No acute osseous abnormality. No discrete lytic or blastic osseous lesions. Mild-to-moderate multilevel cervical spondylolysis, most notable at C5-6. Patient is edentulous. Other neck: No other acute soft tissue abnormality within the neck. Salivary glands within normal limits. Thyroid normal. No pathologically enlarged lymph nodes. Upper chest: Visualized  upper chest demonstrates no acute finding. Changes compatible with mild pulmonary interstitial edema present within the visualized lungs. Review of the MIP images confirms the above findings CTA HEAD FINDINGS Anterior circulation: Petrous segments patent bilaterally. Scattered calcified atheromatous plaque within the cavernous/supraclinoid ICAs bilaterally. Relatively mild multifocal narrowing on the left. There is  focal moderate stenosis at the proximal cavernous right ICA (series 7, image 115). ICA termini well perfused bilaterally. A1 segments patent bilaterally. Right A1 hypoplastic. Subtle 2 mm focal outpouching arising from the anterior communicating artery suspicious for a tiny aneurysm (series 8, image 113). Anterior cerebral arteries patent to their distal aspects without flow-limiting stenosis. M1 segments patent bilaterally without stenosis. Normal MCA bifurcations. Distal MCA branches well perfused and symmetric. Posterior circulation: Multifocal calcified plaque within the dominant right V4 segment with mild segmental tandem stenoses. Mild non stenotic plaque within the hypoplastic left vertebral artery without significant stenosis. Posterior inferior cerebral arteries patent bilaterally. Basilar mildly tortuous with short fenestration at its mid-distal aspect. No focal stenosis seen involving the basilar. Superior cerebral arteries irregular but widely patent bilaterally. Both of the posterior cerebral arteries primarily supplied via the basilar and are well perfused to their distal aspects without stenosis. Small bilateral posterior communicating arteries noted. Venous sinuses: Patent. Anatomic variants: None significant. Delayed phase: No abnormal enhancement. Review of the MIP images confirms the above findings IMPRESSION: 1. Negative CTA for emergent large vessel occlusion. 2. Atheromatous plaque at the origin of the vertebral arteries bilaterally with severe approximate 80% stenosis on the left  and 70% on the right. Additional short-segment mild tandem right V4 stenoses. Vertebrobasilar system otherwise widely patent. 3. Atheromatous stenoses about the carotid bifurcations measuring up to 45% on the right and 40% on the left. 4. 2 mm focal outpouching arising from the anterior communicating artery, suspicious for a small aneurysm. 5. Mild diffuse interlobular septal thickening within the visualized lungs, suggesting mild pulmonary interstitial edema. Electronically Signed   By: Rise MuBenjamin  McClintock M.D.   On: 01/30/2018 23:42   Dg Pelvis Portable  Result Date: 01/31/2018 CLINICAL DATA:  Bilateral lower extremity weakness. EXAM: PORTABLE PELVIS 1-2 VIEWS COMPARISON:  Pelvic CT of 01/30/2017 FINDINGS: Contrast within the urinary bladder and distal ureters. Femoral heads are located. Extensive artifact from overlying support apparatus. No acute fracture. Sacroiliac joints are symmetric. Lower lumbar and lumbosacral spondylosis, suboptimally evaluated. IMPRESSION: No acute findings. Electronically Signed   By: Jeronimo GreavesKyle  Talbot M.D.   On: 01/31/2018 08:28   Dg Chest Port 1 View  Result Date: 01/30/2018 CLINICAL DATA:  Acute onset weakness. History of hypertension, atrial fibrillation. EXAM: PORTABLE CHEST 1 VIEW COMPARISON:  Chest radiograph of November 24, 2015 FINDINGS: Cardiac silhouette is mildly enlarged and unchanged. Calcified aortic arch. Reticulonodular densities increased from prior examination with blunting of the RIGHT costophrenic angle. RIGHT middle lobe airspace opacity. Increased lung volumes. No pneumothorax. Dual lead LEFT cardiac pacemaker in situ. Soft tissue planes included osseous structures are non suspicious. IMPRESSION: 1. RIGHT middle lobe airspace opacity and increased reticulonodular densities concerning for pneumonia. Followup PA and lateral chest X-ray is recommended in 3-4 weeks following trial of antibiotic therapy to ensure resolution and exclude underlying malignancy. 2.  Mild cardiomegaly. 3.  Emphysema (ICD10-J43.9).  Aortic Atherosclerosis (ICD10-I70.0). Electronically Signed   By: Awilda Metroourtnay  Bloomer M.D.   On: 01/30/2018 19:59     Subjective: Eager to go home today  Discharge Exam: Vitals:   02/14/18 0053 02/14/18 0433  BP: (!) 133/54 135/65  Pulse: 66 66  Resp: 18 18  Temp: 99.1 F (37.3 C) 99.2 F (37.3 C)  SpO2: 100% 100%   Vitals:   02/13/18 1325 02/13/18 2106 02/14/18 0053 02/14/18 0433  BP:  (!) 125/41 (!) 133/54 135/65  Pulse:  71 66 66  Resp:  18 18 18   Temp:  98.8  F (37.1 C) 99.1 F (37.3 C) 99.2 F (37.3 C)  TempSrc:  Oral Oral Oral  SpO2:  100% 100% 100%  Weight: 66 kg     Height: 5\' 3"  (1.6 m)       General: Pt is alert, awake, not in acute distress Cardiovascular: RRR, S1/S2 +, no rubs, no gallops Respiratory: CTA bilaterally, no wheezing, no rhonchi Abdominal: Soft, NT, ND, bowel sounds + Extremities: no edema, no cyanosis   The results of significant diagnostics from this hospitalization (including imaging, microbiology, ancillary and laboratory) are listed below for reference.     Microbiology: No results found for this or any previous visit (from the past 240 hour(s)).   Labs: BNP (last 3 results) Recent Labs    01/30/18 2005  BNP 978.0*   Basic Metabolic Panel: Recent Labs  Lab 02/12/18 1625 02/13/18 0417  NA 133* 133*  K 3.9 4.2  CL 95* 97*  CO2 24 23  GLUCOSE 125* 96  BUN 33* 29*  CREATININE 1.62* 1.17*  CALCIUM 9.5 8.8*  MG  --  1.7  PHOS  --  3.1   Liver Function Tests: Recent Labs  Lab 02/12/18 1625  AST 23  ALT 7  ALKPHOS 43  BILITOT 0.3  PROT 9.1*  ALBUMIN 3.6   No results for input(s): LIPASE, AMYLASE in the last 168 hours. No results for input(s): AMMONIA in the last 168 hours. CBC: Recent Labs  Lab 02/12/18 1625 02/13/18 0417  WBC 4.8 6.6  NEUTROABS 3.7  --   HGB 11.1* 9.6*  HCT 36.1 31.1*  MCV 94.5 94.2  PLT 234 211   Cardiac Enzymes: Recent Labs  Lab  02/12/18 1625 02/13/18 0417 02/13/18 1302 02/13/18 1601  TROPONINI 0.03* 0.03* 0.03* 0.03*   BNP: Invalid input(s): POCBNP CBG: Recent Labs  Lab 02/14/18 0734  GLUCAP 84   D-Dimer No results for input(s): DDIMER in the last 72 hours. Hgb A1c No results for input(s): HGBA1C in the last 72 hours. Lipid Profile Recent Labs    02/13/18 0417  CHOL 106  HDL 36*  LDLCALC 61  TRIG 43  CHOLHDL 2.9   Thyroid function studies Recent Labs    02/13/18 0417  TSH 1.560   Anemia work up No results for input(s): VITAMINB12, FOLATE, FERRITIN, TIBC, IRON, RETICCTPCT in the last 72 hours. Urinalysis    Component Value Date/Time   COLORURINE YELLOW 02/12/2018 1840   APPEARANCEUR CLEAR 02/12/2018 1840   LABSPEC 1.019 02/12/2018 1840   PHURINE 5.0 02/12/2018 1840   GLUCOSEU NEGATIVE 02/12/2018 1840   HGBUR SMALL (A) 02/12/2018 1840   BILIRUBINUR NEGATIVE 02/12/2018 1840   KETONESUR 5 (A) 02/12/2018 1840   PROTEINUR NEGATIVE 02/12/2018 1840   UROBILINOGEN 0.2 08/07/2014 1310   NITRITE NEGATIVE 02/12/2018 1840   LEUKOCYTESUR NEGATIVE 02/12/2018 1840   Sepsis Labs Invalid input(s): PROCALCITONIN,  WBC,  LACTICIDVEN Microbiology No results found for this or any previous visit (from the past 240 hour(s)).  Time spent:  SIGNED:   Rickey Barbara, MD  [facility] 02/14/2018, 9:01 AM  If 7PM-7AM, please contact night-coverage

## 2018-02-17 DIAGNOSIS — R531 Weakness: Secondary | ICD-10-CM | POA: Diagnosis not present

## 2018-02-17 DIAGNOSIS — R0600 Dyspnea, unspecified: Secondary | ICD-10-CM | POA: Diagnosis not present

## 2018-02-17 DIAGNOSIS — R29898 Other symptoms and signs involving the musculoskeletal system: Secondary | ICD-10-CM | POA: Diagnosis not present

## 2018-02-17 DIAGNOSIS — J189 Pneumonia, unspecified organism: Secondary | ICD-10-CM | POA: Diagnosis not present

## 2018-02-27 ENCOUNTER — Encounter: Payer: Self-pay | Admitting: Neurology

## 2018-03-02 ENCOUNTER — Ambulatory Visit (INDEPENDENT_AMBULATORY_CARE_PROVIDER_SITE_OTHER): Payer: Medicare PPO

## 2018-03-02 DIAGNOSIS — R55 Syncope and collapse: Secondary | ICD-10-CM

## 2018-03-02 DIAGNOSIS — I441 Atrioventricular block, second degree: Secondary | ICD-10-CM

## 2018-03-03 LAB — CUP PACEART REMOTE DEVICE CHECK
Battery Remaining Longevity: 116 mo
Battery Remaining Percentage: 95.5 %
Brady Statistic AP VP Percent: 41 %
Brady Statistic AP VS Percent: 1 %
Brady Statistic AS VP Percent: 59 %
Brady Statistic AS VS Percent: 1 %
Brady Statistic RA Percent Paced: 41 %
Brady Statistic RV Percent Paced: 99 %
Date Time Interrogation Session: 20200106070013
Implantable Lead Implant Date: 20170928
Implantable Lead Implant Date: 20170928
Implantable Lead Location: 753859
Implantable Lead Location: 753860
Implantable Lead Model: 5076
Implantable Pulse Generator Implant Date: 20170928
Lead Channel Impedance Value: 360 Ohm
Lead Channel Pacing Threshold Amplitude: 0.5 V
Lead Channel Pacing Threshold Amplitude: 1.125 V
Lead Channel Pacing Threshold Pulse Width: 0.4 ms
Lead Channel Pacing Threshold Pulse Width: 0.4 ms
Lead Channel Sensing Intrinsic Amplitude: 2 mV
Lead Channel Sensing Intrinsic Amplitude: 8.1 mV
Lead Channel Setting Pacing Amplitude: 1.375
Lead Channel Setting Pacing Amplitude: 2 V
Lead Channel Setting Pacing Pulse Width: 0.4 ms
Lead Channel Setting Sensing Sensitivity: 2 mV
MDC IDC MSMT BATTERY VOLTAGE: 3.01 V
MDC IDC MSMT LEADCHNL RA IMPEDANCE VALUE: 380 Ohm
Pulse Gen Model: 2272
Pulse Gen Serial Number: 7951003

## 2018-03-03 NOTE — Progress Notes (Signed)
Remote pacemaker transmission.   

## 2018-03-05 DIAGNOSIS — G20 Parkinson's disease: Secondary | ICD-10-CM | POA: Diagnosis not present

## 2018-03-05 DIAGNOSIS — M158 Other polyosteoarthritis: Secondary | ICD-10-CM | POA: Diagnosis not present

## 2018-03-05 DIAGNOSIS — N3281 Overactive bladder: Secondary | ICD-10-CM | POA: Diagnosis not present

## 2018-03-05 DIAGNOSIS — I10 Essential (primary) hypertension: Secondary | ICD-10-CM | POA: Diagnosis not present

## 2018-03-05 DIAGNOSIS — E785 Hyperlipidemia, unspecified: Secondary | ICD-10-CM | POA: Diagnosis not present

## 2018-03-05 DIAGNOSIS — Z6825 Body mass index (BMI) 25.0-25.9, adult: Secondary | ICD-10-CM | POA: Diagnosis not present

## 2018-03-05 DIAGNOSIS — Z950 Presence of cardiac pacemaker: Secondary | ICD-10-CM | POA: Diagnosis not present

## 2018-03-05 DIAGNOSIS — K219 Gastro-esophageal reflux disease without esophagitis: Secondary | ICD-10-CM | POA: Diagnosis not present

## 2018-03-06 NOTE — Progress Notes (Signed)
[name] was seen today in the movement disorders clinic for neurologic consultation at the request of [name], MD.  The consultation is for the evaluation of tremor and r/o PD.  Extensive number of past records have been reviewed.  This patient is accompanied in the office by her child who supplements the history.  According to the patient's daughter, she was unaware that the patient was diagnosed with Parkinson's disease as pt wouldn't let her daughter her go back to the visit.  Pt, however, states that PCP had dx her with PD some years back.  When she went to the hospital recently, they mentioned her dx of Parkinson's disease because of her medication and her daughter wanted a more clear explanation of her diagnosis.  She was referred here for clarity of diagnosis.  I have reviewed extensive records.  She is currently only on carbidopa/levodopa 10/100, 1 tablet twice per day (they think - daughter forgot to bring med list).  Tremor: No.  Per patient, yes per daughter in both hands  How long has it been going on? Unknown but probably years  At rest or with activation?  Both her daughter  [name] hx of tremor?  No.  Located where?  hands  Other Specific Symptoms:  Voice: no change Sleep: sleeps well - day and night  Vivid Dreams:  Yes.    Acting out dreams:  No. Wet Pillows: No. Postural symptoms:  No. but admits that she uses a cane to get around  Falls?  No. Bradykinesia symptoms: slow movements; may or may not drag feet when walks Loss of smell:  No. Loss of taste:  No. Urinary Incontinence:  No., but has some urgency Difficulty Swallowing:  No. Handwriting, micrographia: Yes.   Trouble with ADL's:  No.  Trouble buttoning clothing: No. Depression:  No. Memory changes:  No., daughter lives with patient Hallucinations:  No.  visual distortions: No. N/V:  No. Lightheaded:  No.  Syncope: Yes.  Patient was in the hospital in December, 2019 for syncope while using the  toilet.  This was felt secondary to dehydration. Diplopia:  No. Dyskinesia:  No.   CT brain was last completed on January 30, 2018.  It was unremarkable.  PREVIOUS MEDICATIONS: Sinemet  ALLERGIES:  No Known Allergies  CURRENT MEDICATIONS:  Outpatient Encounter Medications as of 03/10/2018  Medication Sig  . aspirin EC 81 MG tablet Take 81 mg by mouth every evening.   . calcium-vitamin D (OSCAL WITH D) 500-200 MG-UNIT per tablet Take 1 tablet by mouth 2 (two) times daily.  . carbidopa-levodopa (SINEMET IR) 10-100 MG tablet Take 1 tablet by mouth 2 (two) times daily.  Marland Kitchen docusate sodium (COLACE) 100 MG capsule Take 100 mg by mouth daily as needed for mild constipation.  . ergocalciferol (VITAMIN D2) 50000 UNITS capsule Take 50,000 Units by mouth every 14 (fourteen) days.  Marland Kitchen losartan-hydrochlorothiazide (HYZAAR) 50-12.5 MG tablet Take 1 tablet by mouth daily with breakfast.   . NIFEdipine (PROCARDIA-XL/ADALAT-CC/NIFEDICAL-XL) 30 MG 24 hr tablet TAKE 1 TABLET(30 MG) BY MOUTH DAILY (Patient taking differently: Take 30 mg by mouth daily. )  . ondansetron (ZOFRAN) 4 MG tablet Take 1 tablet (4 mg total) by mouth every 6 (six) hours as needed for nausea.  Bertram Gala Glycol-Propyl Glycol (SYSTANE) 0.4-0.3 % SOLN Place 1 drop into both eyes 2 (two) times daily.  . simvastatin (ZOCOR) 20 MG tablet Take 20 mg by mouth at bedtime.   . vitamin B-12 (CYANOCOBALAMIN) 500 MCG  tablet Take 500 mcg by mouth daily with breakfast.   No facility-administered encounter medications on file as of 03/10/2018.     PAST MEDICAL HISTORY:   Past Medical History:  Diagnosis Date  . Arthritis   . High cholesterol   . Hypertension   . Paroxysmal atrial fibrillation (HCC)    a. identified on device interrogation 03/2017, all episodes to date <1 minute  . S/P hysterectomy   . Second degree AV block    a. s/p STJ dual chamber PPM Dr Johney Frame  . Syncope     PAST SURGICAL HISTORY:   Past Surgical History:  Procedure  Laterality Date  . ABDOMINAL HYSTERECTOMY    . EP IMPLANTABLE DEVICE N/A 11/23/2015   SJM Assurity DR pacemaker implanted by Dr Johney Frame for second degree AV block with presyncope  . LOOP RECORDER EXPLANT N/A 03/13/2012   Procedure: LOOP RECORDER EXPLANT;  Surgeon: Marinus Maw, MD;  Location: [facility] CATH LAB;  Service: Cardiovascular;  Laterality: N/A;  . REPLACEMENT TOTAL KNEE Left     SOCIAL HISTORY:   Social History   Socioeconomic History  . Marital status: Unknown    Spouse name: Not on file  . Number of children: 2  . Years of education: Not on file  . Highest education level: Not on file  Occupational History  . Occupation: retired    Associate Professor: Social research officer, [employer]    Comment: Dietary    Employer: RETIRED  Social Needs  . Financial resource strain: Not on file  . Food insecurity:    Worry: Not on file    Inability: Not on file  . Transportation needs:    Medical: Not on file    Non-medical: Not on file  Tobacco Use  . Smoking status: Never Smoker  . Smokeless tobacco: Never Used  Substance and Sexual Activity  . Alcohol use: No    Alcohol/week: 0.0 standard drinks  . Drug use: No  . Sexual activity: Not on file  Lifestyle  . Physical activity:    Days per week: Not on file    Minutes per session: Not on file  . Stress: Not on file  Relationships  . Social connections:    Talks on phone: Not on file    Gets together: Not on file    Attends religious service: Not on file    Active member of club or organization: Not on file    Attends meetings of clubs or organizations: Not on file    Relationship status: Not on file  . Intimate partner violence:    Fear of current or ex partner: Not on file    Emotionally abused: Not on file    Physically abused: Not on file    Forced sexual activity: Not on file  Other Topics Concern  . Not on file  Social History Narrative           FAMILY HISTORY:   Family Status  Relation Name Status  . Other  (Not  Specified)    ROS:  Review of Systems  Constitutional: Negative.   HENT: Negative.   Eyes: Negative.   Respiratory: Negative.   Cardiovascular: Negative.   Gastrointestinal: Negative.   Genitourinary: Negative.   Musculoskeletal: Negative.   Skin: Negative.   Endo/Heme/Allergies: Negative.     PHYSICAL EXAMINATION:    VITALS:   Vitals:   03/10/18 1050  BP: 114/64  Pulse: 76  SpO2: 94%    GEN:  The patient appears stated  age and is in NAD. HEENT:  Normocephalic, atraumatic.  The mucous membranes are moist. The superficial temporal arteries are without ropiness or tenderness. CV:  RRR Lungs:  CTAB Neck/HEME:  There are no carotid bruits bilaterally.  Neurological examination:  Orientation: The patient is alert and oriented x3. Fund of knowledge is appropriate.  Recent and remote memory are intact.  Attention and concentration are normal.    Able to name objects and repeat phrases. Cranial nerves: There is good facial symmetry. Pupils are equal round and reactive to light bilaterally. Fundoscopic exam is attempted but the disc margins are not well visualized bilaterally. Extraocular muscles are intact. The visual fields are full to confrontational testing. The speech is fluent and hypophonic. Soft palate rises symmetrically and there is no tongue deviation. Hearing is intact to conversational tone. Sensation: Sensation is intact to light and pinprick throughout (facial, trunk, extremities). Vibration is intact at the bilateral big toe. There is no extinction with double simultaneous stimulation. There is no sensory dermatomal level identified. Motor: Strength is 5/5 in the bilateral upper and lower extremities.   Shoulder shrug is equal and symmetric.  There is no pronator drift. Deep tendon reflexes: Deep tendon reflexes are 2/4 at the bilateral biceps, triceps, brachioradialis, absent at the bilateral patella and achilles. Plantar responses are downgoing bilaterally.  Movement  examination: Tone: There is normal tone in the bilateral upper extremities.  The tone in the lower extremities is normal.  Abnormal movements: there is only LUE tremor with ambulation.   Coordination:  There is no decremation with RAM's, with any form of RAMS, including alternating supination and pronation of the forearm, hand opening and closing, finger taps, heel taps and toe taps. Gait and Station: The patient pushes off of the chair to arise.  She has a very stooped posture (some appears to be her due to arthritis in the back).  There is left upper extremity resting tremor with ambulation.  She is unstable.  She is somewhat short stepped.  She does not particularly shuffle.  ASSESSMENT/PLAN:  1.  Gait instability with LUE tremor with ambulation  -Patient does not fully meet [place] brain bank criteria for the diagnosis of Parkinson's disease.  That being said, she was on levodopa, but only on carbidopa/levodopa 10/100.  I would not expect that to significantly impact her examination.  I gave her several options today.  We talked about a DaTscan, but she refused.  Ultimately, we decided to try and slightly increase her medication to see if that made a difference in her clinical situation, particularly her ambulation.  We will try carbidopa/levodopa 25/100 CR (did not want to try the immediate release given age), 1 tablet 3 times per day.  Discussed extensively risk, benefits, and side effects of the medication.  Understanding was expressed by patient and her daughter.  -I think that the best thing would be for the patient to use a walker.  She has a walker at home, but absolutely refuses to use it for ambulation.  We talked about potential risks of not using it.  -[facility]  2.  Follow up is anticipated in the next few months, sooner should new neurologic issues arise.  Much greater than 50% of this visit was spent in counseling and coordinating care.  Total face to  face time:  45 min.  This did not include the 40 min of record review which was detailed above, which was non face to  face time.   Cc:  [name], MD

## 2018-03-10 ENCOUNTER — Ambulatory Visit (INDEPENDENT_AMBULATORY_CARE_PROVIDER_SITE_OTHER): Payer: Medicare PPO | Admitting: Neurology

## 2018-03-10 ENCOUNTER — Encounter: Payer: Self-pay | Admitting: Neurology

## 2018-03-10 VITALS — BP 114/64 | HR 76

## 2018-03-10 DIAGNOSIS — G20 Parkinson's disease: Secondary | ICD-10-CM

## 2018-03-10 MED ORDER — CARBIDOPA-LEVODOPA ER 25-100 MG PO TBCR: 1.0000 | EXTENDED_RELEASE_TABLET | Freq: Three times a day (TID) | ORAL | 1 refills | Status: AC

## 2018-03-10 NOTE — Patient Instructions (Addendum)
1. Use a walker at all times.   2. Stop Carbidopa Levodopa 10/100. Start Carbidopa Levodopa 25/100 CR - 1 tablet three times daily, 4 hours apart.

## 2018-03-21 ENCOUNTER — Observation Stay (HOSPITAL_COMMUNITY)
Admission: EM | Admit: 2018-03-21 | Discharge: 2018-03-25 | Disposition: A | Discharge status: Home / Self Care | Payer: Medicare PPO | Attending: Internal Medicine | Admitting: Internal Medicine

## 2018-03-21 ENCOUNTER — Encounter (HOSPITAL_COMMUNITY): Payer: Self-pay

## 2018-03-21 ENCOUNTER — Other Ambulatory Visit: Payer: Self-pay

## 2018-03-21 DIAGNOSIS — R55 Syncope and collapse: Principal | ICD-10-CM | POA: Diagnosis present

## 2018-03-21 DIAGNOSIS — E785 Hyperlipidemia, unspecified: Secondary | ICD-10-CM | POA: Diagnosis not present

## 2018-03-21 DIAGNOSIS — R2689 Other abnormalities of gait and mobility: Secondary | ICD-10-CM | POA: Insufficient documentation

## 2018-03-21 DIAGNOSIS — R9431 Abnormal electrocardiogram [ECG] [EKG]: Secondary | ICD-10-CM | POA: Insufficient documentation

## 2018-03-21 DIAGNOSIS — J470 Bronchiectasis with acute lower respiratory infection: Secondary | ICD-10-CM | POA: Diagnosis not present

## 2018-03-21 DIAGNOSIS — R52 Pain, unspecified: Secondary | ICD-10-CM | POA: Diagnosis not present

## 2018-03-21 DIAGNOSIS — G20 Parkinson's disease: Secondary | ICD-10-CM | POA: Insufficient documentation

## 2018-03-21 DIAGNOSIS — N183 Chronic kidney disease, stage 3 unspecified: Secondary | ICD-10-CM | POA: Diagnosis present

## 2018-03-21 DIAGNOSIS — R1310 Dysphagia, unspecified: Secondary | ICD-10-CM | POA: Diagnosis not present

## 2018-03-21 DIAGNOSIS — R05 Cough: Secondary | ICD-10-CM | POA: Diagnosis not present

## 2018-03-21 DIAGNOSIS — R059 Cough, unspecified: Secondary | ICD-10-CM

## 2018-03-21 DIAGNOSIS — M1990 Unspecified osteoarthritis, unspecified site: Secondary | ICD-10-CM | POA: Insufficient documentation

## 2018-03-21 DIAGNOSIS — I10 Essential (primary) hypertension: Secondary | ICD-10-CM | POA: Diagnosis present

## 2018-03-21 DIAGNOSIS — R4020 Unspecified coma: Secondary | ICD-10-CM | POA: Diagnosis not present

## 2018-03-21 DIAGNOSIS — Z96652 Presence of left artificial knee joint: Secondary | ICD-10-CM | POA: Insufficient documentation

## 2018-03-21 DIAGNOSIS — J849 Interstitial pulmonary disease, unspecified: Secondary | ICD-10-CM | POA: Insufficient documentation

## 2018-03-21 DIAGNOSIS — D631 Anemia in chronic kidney disease: Secondary | ICD-10-CM | POA: Insufficient documentation

## 2018-03-21 DIAGNOSIS — I442 Atrioventricular block, complete: Secondary | ICD-10-CM | POA: Insufficient documentation

## 2018-03-21 DIAGNOSIS — R269 Unspecified abnormalities of gait and mobility: Secondary | ICD-10-CM | POA: Diagnosis not present

## 2018-03-21 DIAGNOSIS — R2681 Unsteadiness on feet: Secondary | ICD-10-CM | POA: Diagnosis not present

## 2018-03-21 DIAGNOSIS — R600 Localized edema: Secondary | ICD-10-CM | POA: Diagnosis not present

## 2018-03-21 DIAGNOSIS — Z90710 Acquired absence of both cervix and uterus: Secondary | ICD-10-CM | POA: Insufficient documentation

## 2018-03-21 DIAGNOSIS — R1084 Generalized abdominal pain: Secondary | ICD-10-CM | POA: Diagnosis not present

## 2018-03-21 DIAGNOSIS — Z9181 History of falling: Secondary | ICD-10-CM | POA: Insufficient documentation

## 2018-03-21 DIAGNOSIS — I482 Chronic atrial fibrillation, unspecified: Secondary | ICD-10-CM | POA: Diagnosis not present

## 2018-03-21 DIAGNOSIS — E876 Hypokalemia: Secondary | ICD-10-CM | POA: Diagnosis not present

## 2018-03-21 DIAGNOSIS — G20A1 Parkinson's disease without dyskinesia, without mention of fluctuations: Secondary | ICD-10-CM | POA: Diagnosis present

## 2018-03-21 DIAGNOSIS — Z7982 Long term (current) use of aspirin: Secondary | ICD-10-CM | POA: Insufficient documentation

## 2018-03-21 DIAGNOSIS — Z950 Presence of cardiac pacemaker: Secondary | ICD-10-CM | POA: Diagnosis not present

## 2018-03-21 DIAGNOSIS — J189 Pneumonia, unspecified organism: Secondary | ICD-10-CM

## 2018-03-21 DIAGNOSIS — N179 Acute kidney failure, unspecified: Secondary | ICD-10-CM | POA: Insufficient documentation

## 2018-03-21 DIAGNOSIS — M6281 Muscle weakness (generalized): Secondary | ICD-10-CM | POA: Insufficient documentation

## 2018-03-21 DIAGNOSIS — R5381 Other malaise: Secondary | ICD-10-CM | POA: Insufficient documentation

## 2018-03-21 DIAGNOSIS — I480 Paroxysmal atrial fibrillation: Secondary | ICD-10-CM | POA: Diagnosis not present

## 2018-03-21 DIAGNOSIS — R42 Dizziness and giddiness: Secondary | ICD-10-CM | POA: Diagnosis not present

## 2018-03-21 DIAGNOSIS — I1310 Hypertensive heart and chronic kidney disease without heart failure, with stage 1 through stage 4 chronic kidney disease, or unspecified chronic kidney disease: Secondary | ICD-10-CM | POA: Insufficient documentation

## 2018-03-21 DIAGNOSIS — E860 Dehydration: Secondary | ICD-10-CM | POA: Diagnosis not present

## 2018-03-21 DIAGNOSIS — J181 Lobar pneumonia, unspecified organism: Secondary | ICD-10-CM

## 2018-03-21 DIAGNOSIS — Z79899 Other long term (current) drug therapy: Secondary | ICD-10-CM | POA: Insufficient documentation

## 2018-03-21 DIAGNOSIS — R630 Anorexia: Secondary | ICD-10-CM | POA: Insufficient documentation

## 2018-03-21 LAB — BASIC METABOLIC PANEL
Anion gap: 12 (ref 5–15)
BUN: 25 mg/dL — ABNORMAL HIGH (ref 8–23)
CO2: 25 mmol/L (ref 22–32)
Calcium: 9.8 mg/dL (ref 8.9–10.3)
Chloride: 97 mmol/L — ABNORMAL LOW (ref 98–111)
Creatinine, Ser: 1.33 mg/dL — ABNORMAL HIGH (ref 0.44–1.00)
GFR calc Af Amer: 41 mL/min — ABNORMAL LOW (ref >60)
GFR calc non Af Amer: 35 mL/min — ABNORMAL LOW (ref >60)
GLUCOSE: 147 mg/dL — AB (ref 70–99)
Potassium: 4 mmol/L (ref 3.5–5.1)
Sodium: 134 mmol/L — ABNORMAL LOW (ref 135–145)

## 2018-03-21 LAB — CBG MONITORING, ED: Glucose-Capillary: 135 mg/dL — ABNORMAL HIGH (ref 70–99)

## 2018-03-21 LAB — CBC
HCT: 39.9 % (ref 36.0–46.0)
Hemoglobin: 12.1 g/dL (ref 12.0–15.0)
MCH: 28.7 pg (ref 26.0–34.0)
MCHC: 30.3 g/dL (ref 30.0–36.0)
MCV: 94.8 fL (ref 80.0–100.0)
NRBC: 0 % (ref 0.0–0.2)
Platelets: 170 10*3/uL (ref 150–400)
RBC: 4.21 MIL/uL (ref 3.87–5.11)
RDW: 13.6 % (ref 11.5–15.5)
WBC: 5.5 10*3/uL (ref 4.0–10.5)

## 2018-03-21 LAB — POC OCCULT BLOOD, ED: Fecal Occult Bld: NEGATIVE

## 2018-03-21 MED ORDER — LACTATED RINGERS IV BOLUS
2000.0000 mL | Freq: Once | INTRAVENOUS | Status: AC
  Administered 2018-03-21: 2000 mL via INTRAVENOUS

## 2018-03-21 NOTE — ED Provider Notes (Signed)
[facility]-EMERGENCY DEPT Provider Note   CSN: [id number] Arrival date & time: 03/21/18  2029     History   Chief Complaint Chief Complaint  Patient presents with  . Loss of Consciousness    HPI [name] is a 85 y.o. female.  HPI  85 year old female with history of paroxysmal A. fib with [employer] pacemaker in place, hypertension and hyperlipidemia comes from her house with chief complaint of syncope.  According to the family they were helping her get to the commode, when her legs gave out.  Patient was sat down and family noted that she " fell asleep for a second".  Family notes that patient was unresponsive because she has started snoring.  Patient snapped out of this episode by herself.  She denies any feeling like she was going to faint.  Patient denies any chest pain, shortness of breath, abdominal pain, UTI-like symptoms, nausea, vomiting.  Family does indicate that patient might be constipated and also dehydrated.  She has been to the ER with similar complaints in the past when she required multiple bags of fluid.  Past Medical History:  Diagnosis Date  . Arthritis   . High cholesterol   . Hypertension   . Paroxysmal atrial fibrillation (HCC)    a. identified on device interrogation 03/2017, all episodes to date <1 minute  . S/P hysterectomy   . Second degree AV block    a. s/p STJ dual chamber PPM Dr Johney Frame  . Syncope     Patient Active Problem List   Diagnosis Date Noted  . Weakness of both legs 01/31/2018  . Elevated troponin 01/31/2018  . Parkinson's disease (HCC) 01/31/2018  . Essential hypertension 01/31/2018  . CAP (community acquired pneumonia) 01/31/2018  . Community acquired pneumonia of right middle lobe of lung (HCC) 01/30/2018  . Near syncope 01/30/2017  . Mild renal insufficiency 01/30/2017  . Hyponatremia 01/30/2017  . Acute colitis 01/30/2017  . Orthostasis 01/30/2017  . Adnexal cyst 01/30/2017  . Bradycardia  11/21/2015  . Complete heart block (HCC) 11/21/2015  . Peripheral vascular disease (HCC) 11/21/2015  . Anemia 11/21/2015  . Hyperlipidemia 07/15/2008  . Hypertensive heart disease     Past Surgical History:  Procedure Laterality Date  . ABDOMINAL HYSTERECTOMY    . EP IMPLANTABLE DEVICE N/A 11/23/2015   SJM Assurity DR pacemaker implanted by Dr Johney Frame for second degree AV block with presyncope  . LOOP RECORDER EXPLANT N/A 03/13/2012   Procedure: LOOP RECORDER EXPLANT;  Surgeon: Marinus Maw, MD;  Location: [facility] CATH LAB;  Service: Cardiovascular;  Laterality: N/A;  . REPLACEMENT TOTAL KNEE Left      OB History    Gravida  0   Para  0   Term  0   Preterm  0   AB  0   Living        SAB  0   TAB  0   Ectopic  0   Multiple      Live Births               Home Medications    Prior to Admission medications   Medication Sig Start Date End Date Taking? Authorizing Provider  aspirin EC 81 MG tablet Take 81 mg by mouth every evening.     [provider]  calcium-vitamin D (OSCAL WITH D) 500-200 MG-UNIT per tablet Take 1 tablet by mouth 2 (two) times daily.    [provider]  Carbidopa-Levodopa ER (  SINEMET CR) 25-100 MG tablet controlled release Take 1 tablet by mouth 3 (three) times daily. 03/10/18   Tat, Octaviano Batty, DO  docusate sodium (COLACE) 100 MG capsule Take 100 mg by mouth daily as needed for mild constipation.    [provider]  ergocalciferol (VITAMIN D2) 50000 UNITS capsule Take 50,000 Units by mouth every 14 (fourteen) days.    [provider]  losartan-hydrochlorothiazide (HYZAAR) 50-12.5 MG tablet Take 1 tablet by mouth daily with breakfast.  07/21/15   [provider]  NIFEdipine (PROCARDIA-XL/ADALAT-CC/NIFEDICAL-XL) 30 MG 24 hr tablet TAKE 1 TABLET(30 MG) BY MOUTH DAILY Patient taking differently: Take 30 mg by mouth daily.  04/24/17   Allred, Fayrene Fearing, MD  ondansetron (ZOFRAN) 4 MG tablet Take 1 tablet (4 mg total)  by mouth every 6 (six) hours as needed for nausea. 02/01/18   Glade Lloyd, MD  Polyethyl Glycol-Propyl Glycol (SYSTANE) 0.4-0.3 % SOLN Place 1 drop into both eyes 2 (two) times daily.    [provider]  simvastatin (ZOCOR) 20 MG tablet Take 20 mg by mouth at bedtime.  08/02/14   [provider]  vitamin B-12 (CYANOCOBALAMIN) 500 MCG tablet Take 500 mcg by mouth daily with breakfast.    [provider]    Family History Family History  Problem Relation Age of Onset  . Diabetes Other     Social History Social History   Tobacco Use  . Smoking status: Never Smoker  . Smokeless tobacco: Never Used  Substance Use Topics  . Alcohol use: No    Alcohol/week: 0.0 standard drinks  . Drug use: No     Allergies   Patient has no known allergies.   Review of Systems Review of Systems  Constitutional: Positive for activity change.  Respiratory: Negative for shortness of breath.   Cardiovascular: Negative for chest pain.  Gastrointestinal: Negative for nausea and vomiting.  Neurological: Positive for syncope. Negative for dizziness.  All other systems reviewed and are negative.    Physical Exam Updated Vital Signs BP (!) 137/55   Pulse 75   Temp 98.5 F (36.9 C) (Rectal)   Resp 18   Ht 5\' 3"  (1.6 m)   Wt 63.5 kg   SpO2 98%   BMI 24.80 kg/m   Physical Exam Vitals signs and nursing note reviewed.  Constitutional:      Appearance: She is well-developed.  HENT:     Head: Normocephalic and atraumatic.     Mouth/Throat:     Mouth: Mucous membranes are dry.  Neck:     Musculoskeletal: Normal range of motion and neck supple.  Cardiovascular:     Rate and Rhythm: Normal rate.  Pulmonary:     Effort: Pulmonary effort is normal.  Abdominal:     General: Bowel sounds are normal.  Musculoskeletal:     Right lower leg: Edema present.     Left lower leg: Edema present.  Skin:    General: Skin is warm and dry.  Neurological:     Mental Status: She  is alert and oriented to person, place, and time.      ED Treatments / Results  Labs (all labs ordered are listed, but only abnormal results are displayed) Labs Reviewed  BASIC METABOLIC PANEL - Abnormal; Notable for the following components:      Result Value   Sodium 134 (*)    Chloride 97 (*)    Glucose, Bld 147 (*)    BUN 25 (*)    Creatinine,  Ser 1.33 (*)    GFR calc non Af Amer 35 (*)    GFR calc Af Amer 41 (*)    All other components within normal limits  CBG MONITORING, ED - Abnormal; Notable for the following components:   Glucose-Capillary 135 (*)    All other components within normal limits  CBC  URINALYSIS, ROUTINE W REFLEX MICROSCOPIC    EKG None ED ECG REPORT   Date: 03/22/2018  Rate: 56  Rhythm: paced  QRS Axis: left  Intervals: normal  ST/T Wave abnormalities: nonspecific ST/T changes  Conduction Disutrbances:none  Narrative Interpretation:   Old EKG Reviewed: unchanged  I have personally reviewed the EKG tracing and agree with the computerized printout as noted.   Radiology No results found.  Procedures Procedures (including critical care time)  Medications Ordered in ED Medications  lactated ringers bolus 2,000 mL (has no administration in time range)     Initial Impression / Assessment and Plan / ED Course  I have reviewed the triage vital signs and the nursing notes.  Pertinent labs & imaging results that were available during my care of the patient were reviewed by me and considered in my medical decision making (see chart for details).     85 year old female with history of A. fib with pacemaker on board comes in with chief complaint of syncope-like event.  It appears that patient was being walked to the commode when she had an episode of leg giving out followed by fainting for just a couple of seconds.  Patient denies any prodrome.  She has no medical complaints from her side.  She is feeling okay and wants to go home.  Family  states that they think patient might be dehydrated.  She does not have any vomiting or diarrhea.  Family does indicate that they might have also seen bloody stool in the commode today.  Digital rectal exam did not reveal any BRBPR or melena.  She does appear slightly dry.  We will start some fluids and check basic labs.  Pacemaker will also be interrogated.  Final Clinical Impressions(s) / ED Diagnoses   Final diagnoses:  Syncope, unspecified syncope type    ED Discharge Orders    None       Derwood KaplanNanavati, Korry Dalgleish, MD 03/22/18 [id number]

## 2018-03-21 NOTE — ED Triage Notes (Signed)
Pt arrived via EMS from home. Pt c/o possible syncope. Pt was ambulating to the bathroom with family assistance and had a syncope episode and became limp. Pt family denies that she had and falls or injury. Pt also reports to had a BM while ambulating, and was reported to have black/tar like stools. Pt did report having dizziness. Hx of syncope   V/s 128/52, HR 80, RR 18, 96%RA, CBG 165. Pt has PM/  20 G left hand.

## 2018-03-22 ENCOUNTER — Observation Stay (HOSPITAL_COMMUNITY): Payer: Medicare PPO

## 2018-03-22 ENCOUNTER — Observation Stay (HOSPITAL_BASED_OUTPATIENT_CLINIC_OR_DEPARTMENT_OTHER): Payer: Medicare PPO

## 2018-03-22 ENCOUNTER — Other Ambulatory Visit: Payer: Self-pay

## 2018-03-22 DIAGNOSIS — I482 Chronic atrial fibrillation, unspecified: Secondary | ICD-10-CM | POA: Diagnosis present

## 2018-03-22 DIAGNOSIS — N183 Chronic kidney disease, stage 3 unspecified: Secondary | ICD-10-CM | POA: Diagnosis present

## 2018-03-22 DIAGNOSIS — I1310 Hypertensive heart and chronic kidney disease without heart failure, with stage 1 through stage 4 chronic kidney disease, or unspecified chronic kidney disease: Secondary | ICD-10-CM | POA: Diagnosis not present

## 2018-03-22 DIAGNOSIS — E876 Hypokalemia: Secondary | ICD-10-CM | POA: Diagnosis not present

## 2018-03-22 DIAGNOSIS — E860 Dehydration: Secondary | ICD-10-CM | POA: Diagnosis not present

## 2018-03-22 DIAGNOSIS — R918 Other nonspecific abnormal finding of lung field: Secondary | ICD-10-CM | POA: Diagnosis not present

## 2018-03-22 DIAGNOSIS — M6281 Muscle weakness (generalized): Secondary | ICD-10-CM | POA: Diagnosis not present

## 2018-03-22 DIAGNOSIS — Z950 Presence of cardiac pacemaker: Secondary | ICD-10-CM | POA: Diagnosis present

## 2018-03-22 DIAGNOSIS — R600 Localized edema: Secondary | ICD-10-CM | POA: Diagnosis not present

## 2018-03-22 DIAGNOSIS — R05 Cough: Secondary | ICD-10-CM | POA: Diagnosis present

## 2018-03-22 DIAGNOSIS — I10 Essential (primary) hypertension: Secondary | ICD-10-CM

## 2018-03-22 DIAGNOSIS — R55 Syncope and collapse: Secondary | ICD-10-CM | POA: Diagnosis not present

## 2018-03-22 DIAGNOSIS — G20 Parkinson's disease: Secondary | ICD-10-CM | POA: Diagnosis not present

## 2018-03-22 DIAGNOSIS — R059 Cough, unspecified: Secondary | ICD-10-CM | POA: Diagnosis present

## 2018-03-22 DIAGNOSIS — N179 Acute kidney failure, unspecified: Secondary | ICD-10-CM | POA: Diagnosis present

## 2018-03-22 DIAGNOSIS — E785 Hyperlipidemia, unspecified: Secondary | ICD-10-CM | POA: Diagnosis not present

## 2018-03-22 DIAGNOSIS — D631 Anemia in chronic kidney disease: Secondary | ICD-10-CM | POA: Diagnosis not present

## 2018-03-22 LAB — HEMOGLOBIN AND HEMATOCRIT, BLOOD
HCT: 31.3 % — ABNORMAL LOW (ref 36.0–46.0)
Hemoglobin: 9.5 g/dL — ABNORMAL LOW (ref 12.0–15.0)

## 2018-03-22 LAB — CBC
HEMATOCRIT: 28.6 % — AB (ref 36.0–46.0)
HEMOGLOBIN: 8.4 g/dL — AB (ref 12.0–15.0)
MCH: 28.4 pg (ref 26.0–34.0)
MCHC: 29.4 g/dL — ABNORMAL LOW (ref 30.0–36.0)
MCV: 96.6 fL (ref 80.0–100.0)
Platelets: 152 10*3/uL (ref 150–400)
RBC: 2.96 MIL/uL — ABNORMAL LOW (ref 3.87–5.11)
RDW: 13.7 % (ref 11.5–15.5)
WBC: 6.4 10*3/uL (ref 4.0–10.5)
nRBC: 0 % (ref 0.0–0.2)

## 2018-03-22 LAB — BASIC METABOLIC PANEL
ANION GAP: 7 (ref 5–15)
BUN: 22 mg/dL (ref 8–23)
CO2: 26 mmol/L (ref 22–32)
Calcium: 8.8 mg/dL — ABNORMAL LOW (ref 8.9–10.3)
Chloride: 101 mmol/L (ref 98–111)
Creatinine, Ser: 1.1 mg/dL — ABNORMAL HIGH (ref 0.44–1.00)
GFR calc Af Amer: 52 mL/min — ABNORMAL LOW (ref >60)
GFR calc non Af Amer: 44 mL/min — ABNORMAL LOW (ref >60)
Glucose, Bld: 148 mg/dL — ABNORMAL HIGH (ref 70–99)
POTASSIUM: 3.3 mmol/L — AB (ref 3.5–5.1)
Sodium: 134 mmol/L — ABNORMAL LOW (ref 135–145)

## 2018-03-22 LAB — GLUCOSE, CAPILLARY: Glucose-Capillary: 106 mg/dL — ABNORMAL HIGH (ref 70–99)

## 2018-03-22 LAB — URINALYSIS, ROUTINE W REFLEX MICROSCOPIC
Bilirubin Urine: NEGATIVE
Glucose, UA: NEGATIVE mg/dL
Ketones, ur: 5 mg/dL — AB
Nitrite: NEGATIVE
Protein, ur: NEGATIVE mg/dL
Specific Gravity, Urine: 1.015 (ref 1.005–1.030)
pH: 5 (ref 5.0–8.0)

## 2018-03-22 MED ORDER — ZOLPIDEM TARTRATE 5 MG PO TABS: 5.0000 mg | ORAL_TABLET | Freq: Every evening | ORAL | Status: DC | PRN

## 2018-03-22 MED ORDER — ONDANSETRON HCL 4 MG/2ML IJ SOLN: 4.0000 mg | Freq: Four times a day (QID) | INTRAMUSCULAR | Status: DC | PRN

## 2018-03-22 MED ORDER — SODIUM CHLORIDE 0.9% FLUSH
3.0000 mL | Freq: Two times a day (BID) | INTRAVENOUS | Status: DC
  Administered 2018-03-22 – 2018-03-24 (×6): 3 mL via INTRAVENOUS

## 2018-03-22 MED ORDER — ACETAMINOPHEN 325 MG PO TABS
650.0000 mg | ORAL_TABLET | Freq: Four times a day (QID) | ORAL | Status: DC | PRN
  Administered 2018-03-22 – 2018-03-23 (×2): 650 mg via ORAL
  Filled 2018-03-22 (×2): qty 2

## 2018-03-22 MED ORDER — DOCUSATE SODIUM 100 MG PO CAPS: 100.0000 mg | ORAL_CAPSULE | Freq: Every day | ORAL | Status: DC | PRN

## 2018-03-22 MED ORDER — DM-GUAIFENESIN ER 30-600 MG PO TB12
1.0000 | ORAL_TABLET | Freq: Two times a day (BID) | ORAL | Status: DC | PRN
  Administered 2018-03-22 – 2018-03-23 (×2): 1 via ORAL
  Filled 2018-03-22 (×2): qty 1

## 2018-03-22 MED ORDER — CALCIUM CARBONATE-VITAMIN D 500-200 MG-UNIT PO TABS
1.0000 | ORAL_TABLET | Freq: Two times a day (BID) | ORAL | Status: DC
  Administered 2018-03-22 – 2018-03-24 (×6): 1 via ORAL
  Filled 2018-03-22 (×6): qty 1

## 2018-03-22 MED ORDER — CYANOCOBALAMIN 500 MCG PO TABS
500.0000 ug | ORAL_TABLET | Freq: Every day | ORAL | Status: DC
  Administered 2018-03-22 – 2018-03-24 (×3): 500 ug via ORAL
  Filled 2018-03-22 (×4): qty 1

## 2018-03-22 MED ORDER — ENOXAPARIN SODIUM 30 MG/0.3ML ~~LOC~~ SOLN
30.0000 mg | Freq: Every day | SUBCUTANEOUS | Status: DC
  Administered 2018-03-22 – 2018-03-23 (×2): 30 mg via SUBCUTANEOUS
  Filled 2018-03-22 (×2): qty 0.3

## 2018-03-22 MED ORDER — POLYVINYL ALCOHOL 1.4 % OP SOLN
1.0000 [drp] | Freq: Two times a day (BID) | OPHTHALMIC | Status: DC
  Administered 2018-03-22 – 2018-03-24 (×5): 1 [drp] via OPHTHALMIC
  Filled 2018-03-22: qty 15

## 2018-03-22 MED ORDER — CARBIDOPA-LEVODOPA ER 25-100 MG PO TBCR
1.0000 | EXTENDED_RELEASE_TABLET | Freq: Three times a day (TID) | ORAL | Status: DC
  Administered 2018-03-22 – 2018-03-24 (×9): 1 via ORAL
  Filled 2018-03-22 (×9): qty 1

## 2018-03-22 MED ORDER — SIMVASTATIN 20 MG PO TABS
20.0000 mg | ORAL_TABLET | Freq: Every day | ORAL | Status: DC
  Administered 2018-03-22 – 2018-03-24 (×3): 20 mg via ORAL
  Filled 2018-03-22 (×3): qty 1

## 2018-03-22 MED ORDER — ONDANSETRON HCL 4 MG PO TABS: 4.0000 mg | ORAL_TABLET | Freq: Four times a day (QID) | ORAL | Status: DC | PRN

## 2018-03-22 MED ORDER — HYDRALAZINE HCL 20 MG/ML IJ SOLN: 5.0000 mg | INTRAMUSCULAR | Status: DC | PRN

## 2018-03-22 MED ORDER — ACETAMINOPHEN 650 MG RE SUPP: 650.0000 mg | Freq: Four times a day (QID) | RECTAL | Status: DC | PRN

## 2018-03-22 MED ORDER — LIP MEDEX EX OINT
TOPICAL_OINTMENT | CUTANEOUS | Status: DC | PRN
  Filled 2018-03-22: qty 7

## 2018-03-22 MED ORDER — POTASSIUM CHLORIDE CRYS ER 20 MEQ PO TBCR
40.0000 meq | EXTENDED_RELEASE_TABLET | Freq: Once | ORAL | Status: AC
  Administered 2018-03-22: 40 meq via ORAL
  Filled 2018-03-22: qty 2

## 2018-03-22 MED ORDER — ASPIRIN EC 81 MG PO TBEC
81.0000 mg | DELAYED_RELEASE_TABLET | Freq: Every evening | ORAL | Status: DC
  Administered 2018-03-22 – 2018-03-24 (×3): 81 mg via ORAL
  Filled 2018-03-22 (×3): qty 1

## 2018-03-22 NOTE — Progress Notes (Signed)
Rx Brief Lovenox note  Wt=63.5, Scr =1.33 Rx adjusted Lovenox to 30 mg daily in pt with CrCl~25 ml/min  Thanks Lorenza EvangelistGreen, Jeshawn Melucci R 03/22/2018 3:20 AM

## 2018-03-22 NOTE — ED Notes (Signed)
Patient transported to CT via stretcher.

## 2018-03-22 NOTE — ED Notes (Signed)
Report given to Hilda, RN.  

## 2018-03-22 NOTE — ED Notes (Signed)
Please call Abbie Sons (patient's daughter) at [phone], and Bud Face (patient's other daughter) at [phone] with room assignment / care updates.

## 2018-03-22 NOTE — ED Notes (Signed)
Patient refused to take orthostatic vitals standing up, she did not feel comfortable standing and would like to wait until tomorrow.

## 2018-03-22 NOTE — Progress Notes (Signed)
*  PRELIMINARY RESULTS* Vascular Ultrasound Carotid Duplex (Doppler) has been completed.  Please see CV Proc tab for preliminary results.  [name] 03/22/2018, 1:25 PM

## 2018-03-22 NOTE — Progress Notes (Signed)
[name] is a 85 y.o. female with medical history significant of hypertension, hyperlipidemia, PAF not on anticoagulants, pacemaker placement due to complete heart block, Parkinson's disease, CKD 3, syncope, who presents with syncope.  Per patient's daughter, pt passed out for few seconds when she was ambulating to the bathroom with family assistance. Pt family denies that she had and falls or injury. Pt also reports to have had a BM while ambulating, and was reported to have black/tar like stools.  No prodromal symptoms.  Denies unilateral weakness, numbness or tingling in extremities. No facial droop or slurred speech.  Denies any chest pain, shortness of breath.  Patient has mild cough with little mucus production.  No fever or chills.  No runny nose or sore throat.  Patient does not have nausea, vomiting, diarrhea or abdominal pain currently.  Denies symptoms of UTI.  Per her daughter, recently patient has poor appetite and did not eat well.  03/22/2018: Patient seen and examined at her bedside.  She is alert and interactive.  Reviewed labs which revealed sharp drop in hemoglobin from 12 to 8.4.  Repeat H&H.  FOBT negative.  Lab studies also remarkable for hypokalemia with potassium 3.3 which was repleted.  Bilateral carotid Doppler ultrasound negative for any significant carotid artery stenosis.  Negative orthostatic vital signs.    Will continue to monitor overnight on telemetry for any acute changes due to history of paroxysmal A. Fib.  Please refer to H&P dictated by Dr. Clyde [name] on 03/22/2018 for further details of the assessment and plan.

## 2018-03-22 NOTE — H&P (Signed)
History and Physical    [name] ZOX:[id number] DOB: [birth year] DOA: 03/21/2018  Referring MD/NP/PA:   PCP: [name], MD   Patient coming from:  The patient is coming from home.  At baseline, pt is dependent for most of ADL.        Chief Complaint: syncope  HPI: [name] is a 85 y.o. female with medical history significant of hypertension, hyperlipidemia, PAF not on anticoagulants, pacemaker placement due to complete heart block, Parkinson's disease, CKD 3, syncope, who presents with syncope.  Per patient's daughter, pt passed out for few seconds when she was ambulating to the bathroom with family assistance. Pt family denies that she had and falls or injury. Pt also reports to have had a BM while ambulating, and was reported to have black/tar like stools.  No prodromal symptoms.  Denies unilateral weakness, numbness or tingling in extremities. No facial droop or slurred speech.  Denies any chest pain, shortness of breath.  Patient has mild cough with little mucus production.  No fever or chills.  No runny nose or sore throat.  Patient does not have nausea, vomiting, diarrhea or abdominal pain currently.  Denies symptoms of UTI.  Per her daughter, recently patient has poor appetite and did not eat well.  ED Course: pt was found to have WBC 5.5, negative FOBT, worsening renal function, temperature normal, no tachycardia, no tachypnea, oxygen saturation 98% on room air.  Patient is placed on telemetry bed of observation.  Review of Systems:   General: no fevers, chills, no body weight gain, has poor appetite, has fatigue HEENT: no blurry vision, hearing changes or sore throat Respiratory: no dyspnea, has coughing, no wheezing CV: no chest pain, no palpitations GI: no nausea, vomiting, abdominal pain, diarrhea, constipation GU: no dysuria, burning on urination, increased urinary frequency, hematuria  Ext: no leg edema Neuro: no unilateral weakness, numbness, or  tingling, no vision change or hearing loss.  Had syncope Skin: no rash, no skin tear. MSK: No muscle spasm, no deformity, no limitation of range of movement in spin Heme: No easy bruising.  Travel history: No recent long distant travel.  Allergy: No Known Allergies  Past Medical History:  Diagnosis Date  . Arthritis   . High cholesterol   . Hypertension   . Paroxysmal atrial fibrillation (HCC)    a. identified on device interrogation 03/2017, all episodes to date <1 minute  . S/P hysterectomy   . Second degree AV block    a. s/p STJ dual chamber PPM Dr Johney Frame  . Syncope     Past Surgical History:  Procedure Laterality Date  . ABDOMINAL HYSTERECTOMY    . EP IMPLANTABLE DEVICE N/A 11/23/2015   SJM [employer] DR pacemaker implanted by Dr Johney Frame for second degree AV block with presyncope  . LOOP RECORDER EXPLANT N/A 03/13/2012   Procedure: LOOP RECORDER EXPLANT;  Surgeon: Marinus Maw, MD;  Location: [facility] CATH LAB;  Service: Cardiovascular;  Laterality: N/A;  . REPLACEMENT TOTAL KNEE Left     Social History:  reports that she has never smoked. She has never used smokeless tobacco. She reports that she does not drink alcohol or use drugs.  Family History:  Family History  Problem Relation Age of Onset  . Diabetes Other      Prior to Admission medications   Medication Sig Start Date End Date Taking? Authorizing Provider  aspirin EC 81 MG tablet Take 81 mg by mouth every evening.     [provider]  calcium-vitamin D (OSCAL WITH D) 500-200 MG-UNIT per tablet Take 1 tablet by mouth 2 (two) times daily.    [provider]  Carbidopa-Levodopa ER (SINEMET CR) 25-100 MG tablet controlled release Take 1 tablet by mouth 3 (three) times daily. 03/10/18   Tat, Octaviano Batty, DO  docusate sodium (COLACE) 100 MG capsule Take 100 mg by mouth daily as needed for mild constipation.    [provider]  ergocalciferol (VITAMIN D2) 50000 UNITS capsule Take 50,000 Units by mouth  every 14 (fourteen) days.    [provider]  losartan-hydrochlorothiazide (HYZAAR) 50-12.5 MG tablet Take 1 tablet by mouth daily with breakfast.  07/21/15   [provider]  NIFEdipine (PROCARDIA-XL/ADALAT-CC/NIFEDICAL-XL) 30 MG 24 hr tablet TAKE 1 TABLET(30 MG) BY MOUTH DAILY Patient taking differently: Take 30 mg by mouth daily.  04/24/17   Allred, Fayrene Fearing, MD  ondansetron (ZOFRAN) 4 MG tablet Take 1 tablet (4 mg total) by mouth every 6 (six) hours as needed for nausea. 02/01/18   Glade Lloyd, MD  Polyethyl Glycol-Propyl Glycol (SYSTANE) 0.4-0.3 % SOLN Place 1 drop into both eyes 2 (two) times daily.    [provider]  simvastatin (ZOCOR) 20 MG tablet Take 20 mg by mouth at bedtime.  08/02/14   [provider]  vitamin B-12 (CYANOCOBALAMIN) 500 MCG tablet Take 500 mcg by mouth daily with breakfast.    [provider]    Physical Exam: Vitals:   03/22/18 0100 03/22/18 0130 03/22/18 0200 03/22/18 0230  BP: (!) 137/49 (!) 133/52 (!) 135/58 (!) 124/52  Pulse: 74 72 72 70  Resp: (!) 21 (!) 21 18 20   Temp:      TempSrc:      SpO2: 97% 98% 97% 98%  Weight:      Height:       General: Not in acute distress.  Dry mucous and membrane HEENT:       Eyes: PERRL, EOMI, no scleral icterus.       ENT: No discharge from the ears and nose, no pharynx injection, no tonsillar enlargement.        Neck: No JVD, no bruit, no mass felt. Heme: No neck lymph node enlargement. Cardiac: S1/S2, RRR, No murmurs, No gallops or rubs. Respiratory: Has mild rhonchi bilaterally.   GI: Soft, nondistended, nontender, no rebound pain, no organomegaly, BS present. GU: No hematuria Ext: No pitting leg edema bilaterally. 2+DP/PT pulse bilaterally. Musculoskeletal: No joint deformities, No joint redness or warmth, no limitation of ROM in spin. Skin: No rashes.  Neuro: Alert, oriented X3, cranial nerves II-XII grossly intact, moves all extremities normally.  Psych: Patient is  not psychotic, no suicidal or hemocidal ideation.  Labs on Admission: I have personally reviewed following labs and imaging studies  CBC: Recent Labs  Lab 03/21/18 2105  WBC 5.5  HGB 12.1  HCT 39.9  MCV 94.8  PLT 170   Basic Metabolic Panel: Recent Labs  Lab 03/21/18 2105  NA 134*  K 4.0  CL 97*  CO2 25  GLUCOSE 147*  BUN 25*  CREATININE 1.33*  CALCIUM 9.8   GFR: Estimated Creatinine Clearance: 25.7 mL/min (A) (by C-G formula based on SCr of 1.33 mg/dL (H)). Liver Function Tests: No results for input(s): AST, ALT, ALKPHOS, BILITOT, PROT, ALBUMIN in the last 168 hours. No results for input(s): LIPASE, AMYLASE in the last 168 hours. No results for input(s): AMMONIA in the last 168 hours. Coagulation Profile: No results for input(s): INR,  PROTIME in the last 168 hours. Cardiac Enzymes: No results for input(s): CKTOTAL, CKMB, CKMBINDEX, TROPONINI in the last 168 hours. BNP (last 3 results) No results for input(s): PROBNP in the last 8760 hours. HbA1C: No results for input(s): HGBA1C in the last 72 hours. CBG: Recent Labs  Lab 03/21/18 2135  GLUCAP 135*   Lipid Profile: No results for input(s): CHOL, HDL, LDLCALC, TRIG, CHOLHDL, LDLDIRECT in the last 72 hours. Thyroid Function Tests: No results for input(s): TSH, T4TOTAL, FREET4, T3FREE, THYROIDAB in the last 72 hours. Anemia Panel: No results for input(s): VITAMINB12, FOLATE, FERRITIN, TIBC, IRON, RETICCTPCT in the last 72 hours. Urine analysis:    Component Value Date/Time   COLORURINE YELLOW 02/12/2018 1840   APPEARANCEUR CLEAR 02/12/2018 1840   LABSPEC 1.019 02/12/2018 1840   PHURINE 5.0 02/12/2018 1840   GLUCOSEU NEGATIVE 02/12/2018 1840   HGBUR SMALL (A) 02/12/2018 1840   BILIRUBINUR NEGATIVE 02/12/2018 1840   KETONESUR 5 (A) 02/12/2018 1840   PROTEINUR NEGATIVE 02/12/2018 1840   UROBILINOGEN 0.2 08/07/2014 1310   NITRITE NEGATIVE 02/12/2018 1840   LEUKOCYTESUR NEGATIVE 02/12/2018 1840   Sepsis  Labs: @LABRCNTIP (procalcitonin:4,lacticidven:4) )No results found for this or any previous visit (from the past 240 hour(s)).   Radiological Exams on Admission: Ct Head Wo Contrast  Result Date: 03/22/2018 CLINICAL DATA:  Syncope EXAM: CT HEAD WITHOUT CONTRAST TECHNIQUE: Contiguous axial images were obtained from the base of the skull through the vertex without intravenous contrast. COMPARISON:  Head CT 01/31/2018 FINDINGS: Brain: There is no mass, hemorrhage or extra-axial collection. The size and configuration of the ventricles and extra-axial CSF spaces are normal. There is hypoattenuation of the white matter, most commonly indicating chronic small vessel disease. Vascular: Atherosclerotic calcification of the internal carotid arteries at the skull base. No abnormal hyperdensity of the major intracranial arteries or dural venous sinuses. Skull: The visualized skull base, calvarium and extracranial soft tissues are normal. Sinuses/Orbits: No fluid levels or advanced mucosal thickening of the visualized paranasal sinuses. No mastoid or middle ear effusion. The orbits are normal. IMPRESSION: 1. No acute intracranial abnormality. 2. Chronic small vessel disease. Electronically Signed   By: Deatra Robinson M.D.   On: 03/22/2018 03:24   Dg Chest Port 1 View  Result Date: 03/22/2018 CLINICAL DATA:  Cough. Weakness. EXAM: PORTABLE CHEST 1 VIEW COMPARISON:  Frontal and lateral views 02/13/2018, additional prior exams. FINDINGS: Left-sided pacemaker in place. Cardiomegaly unchanged from prior. Unchanged mediastinal contours. Interstitial opacities in the mid to lower lung zone predominance, similar to exams last month. Abdominal CT 01/30/2017 demonstrated bronchiectasis and ground-glass opacities. Demonstrated no evidence of new airspace disease or pulmonary edema. Difficult to assess for pleural effusions. No pneumothorax. The bones are under mineralized. IMPRESSION: 1. Mid and lower lung zone interstitial  opacities corresponding to bronchiectasis and interstitial lung disease. This is unchanged over the last month, and may have progressed from 2017 radiograph. 2. Stable cardiomegaly. Electronically Signed   By: Narda Rutherford M.D.   On: 03/22/2018 03:06     EKG: Reviewed independently, paced rhythm, regular, QTC 515  Assessment/Plan Principal Problem:   Syncope Active Problems:   Hyperlipidemia   Parkinson's disease (HCC)   Essential hypertension   Acute renal failure superimposed on stage 3 chronic kidney disease (HCC)   Atrial fibrillation, chronic   Pacemaker   Cough   Syncope: Etiology is not clear.  Patient has worsening renal function and dry mucous membrane on examination, indicating dehydration.  2D echo on 01/31/2018 showed EF  of 50-55%.  No focal neurologic findings on physical examination.  Denies any chest pain or shortness of breath.  - Place on tele bed for obs - Orthostatic vital signs  - f/u CT-head-->negative - will get EKG - Neuro checks  - IVF: 2L ringer solution were ordered by ED physician. - PT/OT eval and treat - ED physician Dr. Rhunette CroftNanavati will help to do pacemaker interrogation  AoCKD-III: Baseline Cre is ~1.0, pt's Cre is 1.33 and BUN 25 on admission. Likely due to dehydration and continuation of ARB, diuretics - IVF as above - Follow up renal function by BMP - Hold Hyzaar  Hyperlipidemia: -zocor  Parkinson's disease (HCC): -sinemet  Essential hypertension: -Hold Hyzaar due to worsening renal function -Continue nifedipine - IV hydralazine PRN  Atrial Fibrillation (PAF): CHA2DS2-VASc Score is 4, needs oral anticoagulation, but pt is not on AC, possibly due to high risk for fall secondary to Parkinson's disease.  Patient is -continue aspirin  Cough: No chest pain or shortness of breath.  No fever or chills.  Possibly due to mild upper respiratory viral infection. - Plan Mucinex for cough -Follow-up chest x-ray     DVT ppx: SQ Lovenox Code  Status: Full code Family Communication:       Yes, patient's  daughter at bed side Disposition Plan:  Anticipate discharge back to previous home environment Consults called:  none Admission status: Obs / tele    Date of Service 03/22/2018    Lorretta HarpXilin Meya Clutter [facility]   If 7PM-7AM, please contact night-coverage [email or web address] Password [email or web address] 03/22/2018, 3:35 AM

## 2018-03-22 NOTE — ED Notes (Signed)
ED TO INPATIENT HANDOFF REPORT  Name/Age/Gender [name] 85 y.o. female  Code Status    Code Status Orders  (From admission, onward)         Start     Ordered   03/22/18 0228  Full code  Continuous     03/22/18 0228        Code Status History    Date Active Date Inactive Code Status Order ID Comments User Context   02/13/2018 0338 02/14/2018 1517 Full Code [id number]  Robin RoyaltyHugelmeyer, Alexis, DO ED   01/31/2018 0328 02/01/2018 1808 Full Code [id number]  Robin ClosKakrakandy, Arshad N, MD Inpatient   01/30/2017 0444 01/31/2017 1920 Full Code [id number]  Robin Deutscherpyd, Timothy S, MD ED   11/21/2015 2018 11/23/2015 1542 Full Code [id number]  Robin Boyerilley, William S, MD Inpatient   08/07/2014 1752 08/08/2014 2113 Full Code [id number]  Robin PiaVega, Orlando, MD Inpatient      Home/SNF/Other Home  Chief Complaint Sycopal Episode  Level of Care/Admitting Diagnosis ED Disposition    ED Disposition Condition Comment   [name] [[id number]]  Level of Care: Telemetry [5]  Admit to tele based on following criteria: Other see comments  Comments: syncope  Diagnosis: Syncope [206001]  Admitting Physician: Lorretta HarpNIU, XILIN [[id number]]  Attending Physician: Lorretta HarpNIU, XILIN [[id number]]  PT Class (Do Not Modify): Observation [104]  PT Acc Code (Do Not Modify): Observation [10022]       Medical History Past Medical History:  Diagnosis Date  . Arthritis   . High cholesterol   . Hypertension   . Paroxysmal atrial fibrillation (HCC)    a. identified on device interrogation 03/2017, all episodes to date <1 minute  . [name]/P hysterectomy   . Second degree AV block    a. [name]/p STJ dual chamber PPM Dr Johney FrameAllred  . Syncope     Allergies No Known Allergies  IV Location/Drains/Wounds Patient Lines/Drains/Airways Status   Active Line/Drains/Airways    Name:   Placement date:   Placement time:   Site:   Days:   Peripheral IV 03/21/18 Left Antecubital   03/21/18    2107    Antecubital   1   Peripheral IV  03/21/18 Left Hand   03/21/18    2108    Hand   1   External Urinary Catheter   02/13/18    0215    -   37          Labs/Imaging Results for orders placed or performed during the [name] encounter of 03/21/18 (from the past 48 hour([name]))  Basic metabolic panel     Status: Abnormal   Collection Time: 03/21/18  9:05 PM  Result Value Ref Range   Sodium 134 (L) 135 - 145 mmol/L   Potassium 4.0 3.5 - 5.1 mmol/L   Chloride 97 (L) 98 - 111 mmol/L   CO2 25 22 - 32 mmol/L   Glucose, Bld 147 (H) 70 - 99 mg/dL   BUN 25 (H) 8 - 23 mg/dL   Creatinine, Ser 5.361.33 (H) 0.44 - 1.00 mg/dL   Calcium 9.8 8.9 - 64.410.3 mg/dL   GFR calc non Af Amer 35 (L) >60 mL/min   GFR calc Af Amer 41 (L) >60 mL/min   Anion gap 12 5 - 15    Comment: Performed at [name], [address] [name], [name] [address]  CBC     Status: None   Collection Time: 03/21/18  9:05 PM  Result Value Ref  Range   WBC 5.5 4.0 - 10.5 K/uL   RBC 4.21 3.87 - 5.11 MIL/uL   Hemoglobin 12.1 12.0 - 15.0 g/dL   HCT 16.139.9 09.636.0 - 04.546.0 %   MCV 94.8 80.0 - 100.0 fL   MCH 28.7 26.0 - 34.0 pg   MCHC 30.3 30.0 - 36.0 g/dL   RDW 40.913.6 81.111.5 - 91.415.5 %   Platelets 170 150 - 400 K/uL   nRBC 0.0 0.0 - 0.2 %    Comment: Performed at [name], [address] [place], [name] [address]  CBG monitoring, ED     Status: Abnormal   Collection Time: 03/21/18  9:35 PM  Result Value Ref Range   Glucose-Capillary 135 (H) 70 - 99 mg/dL  POC occult blood, ED     Status: None   Collection Time: 03/21/18 11:58 PM  Result Value Ref Range   Fecal Occult Bld NEGATIVE NEGATIVE   No results found.  Pending Labs Unresulted Labs (From admission, onward)    Start     Ordered   03/22/18 0500  Basic metabolic panel  Tomorrow morning,   R     03/22/18 0228   03/22/18 0500  CBC  Tomorrow morning,   R     03/22/18 0228   03/21/18 2042  Urinalysis, Routine w reflex microscopic  Once,   STAT     03/21/18 2041           Vitals/Pain Today'[name] Vitals   03/22/18 0030 03/22/18 0100 03/22/18 0130 03/22/18 0200  BP: (!) 147/52 (!) 137/49 (!) 133/52 (!) 135/58  Pulse: 75 74 72 72  Resp: 16 (!) 21 (!) 21 18  Temp:      TempSrc:      SpO2: 99% 97% 98% 97%  Weight:      Height:        Isolation Precautions No active isolations  Medications Medications  dextromethorphan-guaiFENesin (MUCINEX DM) 30-600 MG per 12 hr tablet 1 tablet (has no administration in time range)  sodium chloride flush (NS) 0.9 % injection 3 mL (has no administration in time range)  enoxaparin (LOVENOX) injection 40 mg (has no administration in time range)  acetaminophen (TYLENOL) tablet 650 mg (has no administration in time range)    Or  acetaminophen (TYLENOL) suppository 650 mg (has no administration in time range)  ondansetron (ZOFRAN) tablet 4 mg (has no administration in time range)    Or  ondansetron (ZOFRAN) injection 4 mg (has no administration in time range)  hydrALAZINE (APRESOLINE) injection 5 mg (has no administration in time range)  zolpidem (AMBIEN) tablet 5 mg (has no administration in time range)  lactated ringers bolus 2,000 mL (0 mLs Intravenous Stopped 03/22/18 0254)    Mobility walks with device

## 2018-03-22 NOTE — Evaluation (Addendum)
Physical Therapy Evaluation Patient Details Name: [name] MRN: [id number] DOB: 1926-05-15 Today's Date: 03/22/2018   History of Present Illness  85 yo female admitted with syncope, AKI. Hx of OA, Afib, pacemaker, syncope, 2* AV block, Parkinson's disease, chronic anemia; head CT negative  Clinical Impression  Pt admitted with above diagnosis. Pt currently with functional limitations due to the deficits listed below (see PT Problem List).   Pt with limited mobility d/t fatigue and bil knee pain today; no dizziness with EOB, orthostatics done up to sitting position with nursing and were negative;  Pt will benefit from skilled PT to increase their independence and safety with mobility to allow discharge to the venue listed below.       Follow Up Recommendations Home health PT;Supervision/Assistance - 24 hour    Equipment Recommendations  None recommended by PT    Recommendations for Other Services       Precautions / Restrictions Precautions Precautions: Fall Restrictions Weight Bearing Restrictions: No      Mobility  Bed Mobility Overal bed mobility: Needs Assistance Bed Mobility: Supine to Sit;Sit to Supine     Supine to sit: Mod assist Sit to supine: Max assist   General bed mobility comments: assist with LEs and trunk in both directions  Transfers                 General transfer comment: NT with +1 and d/t severe knee pain; lateral scooting along EOB with min/mod assist  Ambulation/Gait                Stairs            Wheelchair Mobility    Modified Rankin (Stroke Patients Only)       Balance Overall balance assessment: History of Falls Sitting-balance support: Feet supported;Single extremity supported;Bilateral upper extremity supported Sitting balance-Leahy Scale: Poor Sitting balance - Comments: assisted needed to maintain midline Postural control: Posterior lean                                    Pertinent Vitals/Pain Pain Assessment: Faces Faces Pain Scale: Hurts whole lot Pain Location: bil knees, R>L Pain Descriptors / Indicators: Aching Pain Intervention(s): Limited activity within patient's tolerance;Monitored during session    Home Living Family/patient expects to be discharged to:: Private residence Living Arrangements: Children;Other relatives Available Help at Discharge: Family;Available 24 hours/day Type of Home: House Home Access: Stairs to enter Entrance Stairs-Rails: Right Entrance Stairs-Number of Steps: 2 Home Layout: One level Home Equipment: Walker - 2 wheels;Cane - single point      Prior Function Level of Independence: Independent with assistive device(s)--ambulated short distance with cane         Comments: uses a cane to amb to bathroom       Hand Dominance        Extremity/Trunk Assessment   Upper Extremity Assessment Upper Extremity Assessment: Generalized weakness    Lower Extremity Assessment Lower Extremity Assessment: Generalized weakness       Communication   Communication: No difficulties  Cognition Arousal/Alertness: Awake/alert(sleepy but arouses to voice) Behavior During Therapy: Flat affect                                   General Comments: pt verbalizes minimally, family answers most questions, follows one step commands consistently  General Comments      Exercises     Assessment/Plan    PT Assessment Patient needs continued PT services  PT Problem List Decreased strength;Decreased mobility;Decreased activity tolerance;Decreased balance;Pain       PT Treatment Interventions DME instruction;Gait training;Functional mobility training;Therapeutic activities;Therapeutic exercise;Patient/family education    PT Goals (Current goals can be found in the Care Plan section)  Acute Rehab PT Goals Patient Stated Goal: home with family PT Goal Formulation: With patient and dtr Time For Goal  Achievement: 04/05/18 Potential to Achieve Goals: Good    Frequency Min 3X/week   Barriers to discharge        Co-evaluation               AM-PAC PT "6 Clicks" Mobility  Outcome Measure Help needed turning from your back to your side while in a flat bed without using bedrails?: A Lot Help needed moving from lying on your back to sitting on the side of a flat bed without using bedrails?: A Lot Help needed moving to and from a bed to a chair (including a wheelchair)?: A Lot Help needed standing up from a chair using your arms (e.g., wheelchair or bedside chair)?: Total Help needed to walk in hospital room?: Total Help needed climbing 3-5 steps with a railing? : Total 6 Click Score: 9    End of Session   Activity Tolerance: Patient limited by fatigue;Patient limited by pain Patient left: with call bell/phone within reach;with family/visitor present;with bed alarm set;in bed   PT Visit Diagnosis: Muscle weakness (generalized) (M62.81);History of falling (Z91.81)    Time: 3734-2876 PT Time Calculation (min) (ACUTE ONLY): 26 min   Charges:   PT Evaluation $PT Eval Low Complexity: 1 Low PT Treatments $Self Care/Home Management: 8-22        Drucilla Chalet, PT  Pager: [phone] Acute Rehab Dept [facility]): [phone]   03/22/2018   [facility] 03/22/2018, 4:29 PM

## 2018-03-23 DIAGNOSIS — N179 Acute kidney failure, unspecified: Secondary | ICD-10-CM | POA: Diagnosis not present

## 2018-03-23 DIAGNOSIS — R55 Syncope and collapse: Secondary | ICD-10-CM | POA: Diagnosis not present

## 2018-03-23 DIAGNOSIS — N183 Chronic kidney disease, stage 3 (moderate): Secondary | ICD-10-CM | POA: Diagnosis not present

## 2018-03-23 LAB — IRON AND TIBC
Iron: 15 ug/dL — ABNORMAL LOW (ref 28–170)
Saturation Ratios: 9 % — ABNORMAL LOW (ref 10.4–31.8)
TIBC: 161 ug/dL — ABNORMAL LOW (ref 250–450)
UIBC: 146 ug/dL

## 2018-03-23 LAB — CBC
HCT: 27.7 % — ABNORMAL LOW (ref 36.0–46.0)
Hemoglobin: 8.4 g/dL — ABNORMAL LOW (ref 12.0–15.0)
MCH: 28.2 pg (ref 26.0–34.0)
MCHC: 30.3 g/dL (ref 30.0–36.0)
MCV: 93 fL (ref 80.0–100.0)
Platelets: 161 10*3/uL (ref 150–400)
RBC: 2.98 MIL/uL — ABNORMAL LOW (ref 3.87–5.11)
RDW: 13.6 % (ref 11.5–15.5)
WBC: 5.4 10*3/uL (ref 4.0–10.5)
nRBC: 0 % (ref 0.0–0.2)

## 2018-03-23 LAB — BASIC METABOLIC PANEL
Anion gap: 8 (ref 5–15)
BUN: 18 mg/dL (ref 8–23)
CO2: 26 mmol/L (ref 22–32)
Calcium: 8.7 mg/dL — ABNORMAL LOW (ref 8.9–10.3)
Chloride: 99 mmol/L (ref 98–111)
Creatinine, Ser: 1.01 mg/dL — ABNORMAL HIGH (ref 0.44–1.00)
GFR, EST AFRICAN AMERICAN: 57 mL/min — AB (ref >60)
GFR, EST NON AFRICAN AMERICAN: 49 mL/min — AB (ref >60)
Glucose, Bld: 91 mg/dL (ref 70–99)
Potassium: 3.8 mmol/L (ref 3.5–5.1)
Sodium: 133 mmol/L — ABNORMAL LOW (ref 135–145)

## 2018-03-23 LAB — GLUCOSE, CAPILLARY: Glucose-Capillary: 76 mg/dL (ref 70–99)

## 2018-03-23 LAB — MAGNESIUM: Magnesium: 1.7 mg/dL (ref 1.7–2.4)

## 2018-03-23 LAB — FERRITIN: Ferritin: 189 ng/mL (ref 11–307)

## 2018-03-23 LAB — PROCALCITONIN: Procalcitonin: 0.23 ng/mL

## 2018-03-23 MED ORDER — SODIUM CHLORIDE 0.9 % IV SOLN
INTRAVENOUS | Status: DC | PRN
  Administered 2018-03-23: 250 mL via INTRAVENOUS

## 2018-03-23 MED ORDER — SODIUM CHLORIDE 0.9 % IV SOLN
1.5000 g | Freq: Three times a day (TID) | INTRAVENOUS | Status: DC
  Administered 2018-03-23 – 2018-03-24 (×2): 1.5 g via INTRAVENOUS
  Filled 2018-03-23 (×3): qty 1.5

## 2018-03-23 MED ORDER — FERROUS SULFATE 325 (65 FE) MG PO TABS
325.0000 mg | ORAL_TABLET | Freq: Every day | ORAL | Status: DC
  Administered 2018-03-23 – 2018-03-24 (×2): 325 mg via ORAL
  Filled 2018-03-23 (×2): qty 1

## 2018-03-23 MED ORDER — GERHARDT'S BUTT CREAM
TOPICAL_CREAM | Freq: Four times a day (QID) | CUTANEOUS | Status: DC
  Administered 2018-03-23 – 2018-03-24 (×5): via TOPICAL
  Filled 2018-03-23: qty 1

## 2018-03-23 MED ORDER — MAGNESIUM SULFATE 2 GM/50ML IV SOLN
2.0000 g | Freq: Once | INTRAVENOUS | Status: AC
  Administered 2018-03-23: 2 g via INTRAVENOUS
  Filled 2018-03-23: qty 50

## 2018-03-23 NOTE — Progress Notes (Addendum)
Late entry:  Patient voided approximately 200 ml.  Bladder scan 186 ml with no urge to void.  Patient has poor appetite and intake.  Dr. Margo Aye notified and on unit to see patient. No new orders.

## 2018-03-23 NOTE — Consult Note (Signed)
WOC Nurse wound consult note Reason for Consult: Consulted for Stage 2 Pressure Injury, NOTE:  Patient has partial thickness tissue loss secondary to moisture, not pressure Wound type:moisture associated skin damage (MASD), specifically, Incontinence associated dermatitis (IAD) and intertriginous dermatitis (ITD) Pressure Injury POA: N/A Measurement: linear lesion in the intragluteal space: 5cm x 0.2cm x 0.1cm Wound CMK:LKJZ, moist Drainage (amount, consistency, odor) scant serous Periwound:intact, macerated Dressing procedure/placement/frequency:Patient now with external powered suction device (PurWick) for UI. Decreased mobility. Have added a topical preparation to serve as a moisture barrier and antifungal, also has benefit of hydrocortisone. Patient to be turned and repositioned from side to side and time spent in the supine position minimized. Bilateral pressure redistribution heel boots are provided to reduce risk of pressure injury to heels.  WOC nursing team will not follow, but will remain available to this patient, the nursing and medical teams.  Please re-consult if needed. Thanks, Ladona Mow, MSN, RN, GNP, [facility]  Pager# [phone]

## 2018-03-23 NOTE — Progress Notes (Addendum)
PROGRESS NOTE  [name] PHX:[id number] DOB: [birth year] DOA: 03/21/2018 PCP: Robin Matin, MD  HPI/Recap of past 24 hours: [name] a 85 y.o.femalewith medical history significant ofhypertension, hyperlipidemia, PAF not on anticoagulants, pacemaker placement due to complete heart block, Parkinson's disease, CKD 3, syncope, who presents with syncope.  Per patient's daughter,ptpassed out for few seconds when she wasambulating to the bathroom with family assistance.Pt family denies that she had and falls or injury. Pt also reports to havehad a BM while ambulating, and was reported to have black/tar like stools.No prodromal symptoms. Denies unilateral weakness, numbness or tingling inextremities. No facial droop or slurred speech. Denies any chest pain,shortness of breath. Patient has mild cough with little mucus production. No fever or chills. No runny nose or sore throat. Patient does not have nausea, vomiting, diarrhea or abdominal pain currently. Denies symptoms of UTI. Per her daughter, recently patient has poor appetite and did not eat well.  03/22/2018: Patient seen and examined at her bedside.  She is alert and interactive.  Reviewed labs which revealed sharp drop in hemoglobin from 12 to 8.4.  Repeat H&H.  FOBT negative.  Lab studies also remarkable for hypokalemia with potassium 3.3 which was repleted.  Bilateral carotid Doppler ultrasound negative for any significant carotid artery stenosis.  Negative orthostatic vital signs.    Will continue to monitor overnight on telemetry for any acute changes due to history of paroxysmal A. Fib.  03/23/18: Patient seen and examined at her bedside.  She is alert and interactive.  She is coughing persistently, productive.  Independently reviewed chest x-ray done on admission which revealed right middle and right lower lobe infiltrates with high suspicion for aspiration.  Procalcitonin mildly elevated at 0.23.   Speech therapy will assess for any signs of dysphagia.  Start Unasyn empirically.   Assessment/Plan: Principal Problem:   Syncope Active Problems:   Hyperlipidemia   Parkinson's disease (HCC)   Essential hypertension   Acute renal failure superimposed on stage 3 chronic kidney disease (HCC)   Atrial fibrillation, chronic   Pacemaker   Cough   Syncope, unclear etiology Work-up unrevealing Negative and O2 sats orthostatic vital signs Negative CT head Negative carotid Doppler ultrasound Urine analysis negative PT recommended home health PT with 24-hour supervision  Suspected aspiration pneumonitis versus early aspiration pneumonia Independent review chest x-ray done admission which revealed right lower and right middle lobe infiltrates Mildly elevated procalcitonin 0.23 Speech therapist consulted to evaluate for dysphasia Aspiration precautions  Chronic normocytic anemia Hemoglobin 8.4 from 9.8 No sign of overt bleeding Negative FOBT Iron studies remarkable for deficiency Start ferrous sulfate 325 mg daily Repeat CBC in the morning  Hypomagnesemia  Magnesium 1.7 Repleted with IV magnesium 2 g once  Ambulatory dysfunction/physical debility PT recommend home health PT with 24-hour supervision Full precaution Continue physical therapy  Hyperlipidemia Continue Zocor  AoCKD-III: Baseline Cre is ~1.0 Presented with creatinine of 1.33 BMP in the morning Continue to hold off Hyzaar  Parkinson's disease (HCC): -sinemet  Essential hypertension: -Hold Hyzaar due to recent AKI -Continue nifedipine - IV hydralazine PRN  Atrial Fibrillation (PAF): CHA2DS2-VASc Score is 4 Not on AC, possibly due to high risk for fall secondary to Parkinson's disease.  -continue aspirin    Risks: High risk for decompensation due to suspected aspiration pneumonia, patient requires further work-up to rule out dysphagia, multiple comorbidities and advanced age.  Patient will need at  least 2 midnights for further assessment and treatment of present condition.  DVT ppx: SQ Lovenox daily Code Status: Full code Family Communication:   None at bedside Disposition Plan:   DC possibly tomorrow 03/24/2018 Consults called:  none    Objective: Vitals:   03/22/18 2251 03/23/18 0607 03/23/18 0650 03/23/18 1359  BP: (!) 115/45 (!) 106/43  (!) 117/54  Pulse: 72 66  71  Resp: (!) 24 (!) 21  (!) 25  Temp: 99.9 F (37.7 C) 98.1 F (36.7 C)  99 F (37.2 C)  TempSrc: Oral Oral  Oral  SpO2: 97% 100%  96%  Weight:   61 kg   Height:        Intake/Output Summary (Last 24 hours) at 03/23/2018 1452 Last data filed at 03/23/2018 [id number] Gross per 24 hour  Intake 170 ml  Output 500 ml  Net -330 ml   Filed Weights   03/21/18 0910 03/21/18 2110 03/23/18 0650  Weight: 63.5 kg 63.5 kg 61 kg    Exam:  . General: 85 y.o. year-old female well developed well nourished in no acute distress.  Alert and interactive. . Cardiovascular: Regular rate and rhythm with no rubs or gallops.  No thyromegaly or JVD noted.   Marland Kitchen. Respiratory: Diffuse rales bilaterally.  Good inspiratory effort. . Abdomen: Soft nontender nondistended with normal bowel sounds x4 quadrants. . Musculoskeletal: Trace lower extremity edema. 2/4 pulses in all 4 extremities. Marland Kitchen. Psychiatry: Mood is appropriate for condition and setting   Data Reviewed: CBC: Recent Labs  Lab 03/21/18 2105 03/22/18 0518 03/22/18 0706 03/23/18 0534  WBC 5.5 6.4  --  5.4  HGB 12.1 8.4* 9.5* 8.4*  HCT 39.9 28.6* 31.3* 27.7*  MCV 94.8 96.6  --  93.0  PLT 170 152  --  161   Basic Metabolic Panel: Recent Labs  Lab 03/21/18 2105 03/22/18 0518 03/23/18 0534  NA 134* 134* 133*  K 4.0 3.3* 3.8  CL 97* 101 99  CO2 25 26 26   GLUCOSE 147* 148* 91  BUN 25* 22 18  CREATININE 1.33* 1.10* 1.01*  CALCIUM 9.8 8.8* 8.7*  MG  --   --  1.7   GFR: Estimated Creatinine Clearance: 31.2 mL/min (A) (by C-G formula based on SCr of 1.01 mg/dL  (H)). Liver Function Tests: No results for input(s): AST, ALT, ALKPHOS, BILITOT, PROT, ALBUMIN in the last 168 hours. No results for input(s): LIPASE, AMYLASE in the last 168 hours. No results for input(s): AMMONIA in the last 168 hours. Coagulation Profile: No results for input(s): INR, PROTIME in the last 168 hours. Cardiac Enzymes: No results for input(s): CKTOTAL, CKMB, CKMBINDEX, TROPONINI in the last 168 hours. BNP (last 3 results) No results for input(s): PROBNP in the last 8760 hours. HbA1C: No results for input(s): HGBA1C in the last 72 hours. CBG: Recent Labs  Lab 03/21/18 2135 03/22/18 0752 03/23/18 0750  GLUCAP 135* 106* 76   Lipid Profile: No results for input(s): CHOL, HDL, LDLCALC, TRIG, CHOLHDL, LDLDIRECT in the last 72 hours. Thyroid Function Tests: No results for input(s): TSH, T4TOTAL, FREET4, T3FREE, THYROIDAB in the last 72 hours. Anemia Panel: Recent Labs    03/23/18 0848  FERRITIN 189  TIBC 161*  IRON 15*   Urine analysis:    Component Value Date/Time   COLORURINE YELLOW 03/22/2018 0300   APPEARANCEUR CLEAR 03/22/2018 0300   LABSPEC 1.015 03/22/2018 0300   PHURINE 5.0 03/22/2018 0300   GLUCOSEU NEGATIVE 03/22/2018 0300   HGBUR SMALL (A) 03/22/2018 0300   BILIRUBINUR NEGATIVE 03/22/2018 0300   KETONESUR 5 (A)  03/22/2018 0300   PROTEINUR NEGATIVE 03/22/2018 0300   UROBILINOGEN 0.2 08/07/2014 1310   NITRITE NEGATIVE 03/22/2018 0300   LEUKOCYTESUR MODERATE (A) 03/22/2018 0300   Sepsis Labs: @LABRCNTIP (procalcitonin:4,lacticidven:4)  )No results found for this or any previous visit (from the past 240 hour(s)).    Studies: No results found.  Scheduled Meds: . aspirin EC  81 mg Oral QPM  . calcium-vitamin D  1 tablet Oral BID  . Carbidopa-Levodopa ER  1 tablet Oral TID  . enoxaparin (LOVENOX) injection  30 mg Subcutaneous Daily  . ferrous sulfate  325 mg Oral Q breakfast  . Gerhardt's butt cream   Topical QID  . polyvinyl alcohol  1  drop Both Eyes BID  . simvastatin  20 mg Oral QHS  . sodium chloride flush  3 mL Intravenous Q12H  . vitamin B-12  500 mcg Oral Q breakfast    Continuous Infusions: . sodium chloride 250 mL (03/23/18 0920)     LOS: 0 days     Darlin Drop, MD [facility] Pager [phone]  If 7PM-7AM, please contact night-coverage [email or web address] Password [email or web address] 03/23/2018, 2:52 PM

## 2018-03-23 NOTE — Progress Notes (Signed)
Pharmacy Antibiotic Note  [name] is a 85 y.o. female admitted on 03/21/2018 with medical history significant ofhypertension, hyperlipidemia, PAF not on anticoagulants, pacemaker placement due to complete heart block, Parkinson's disease, CKD 3,  who presents with syncope.Marland Kitchen  Pharmacy has been consulted for unasyn dosing for asp pna.  Plan: unasyn 1.5gm IV q8h Follow renal function and clinical course  Height: 5\' 3"  (160 cm) Weight: 134 lb 7.7 oz (61 kg) IBW/kg (Calculated) : 52.4  Temp (24hrs), Avg:99.3 F (37.4 C), Min:98.1 F (36.7 C), Max:100.1 F (37.8 C)  Recent Labs  Lab 03/21/18 2105 03/22/18 0518 03/23/18 0534  WBC 5.5 6.4 5.4  CREATININE 1.33* 1.10* 1.01*    Estimated Creatinine Clearance: 31.2 mL/min (A) (by C-G formula based on SCr of 1.01 mg/dL (H)).    No Known Allergies  Antimicrobials this admission: 1/27 unasyn >> Dose adjustments this admission:   Microbiology results:  Thank you for allowing pharmacy to be a part of this patient's care.  Arley Phenix RPh 03/23/2018, 3:02 PM Pager [phone]

## 2018-03-23 NOTE — Progress Notes (Signed)
OT Cancellation Note  Patient Details Name: [name] MRN: [id number] DOB: 09-29-1926   Cancelled Treatment:    Reason Eval/Treat Not Completed: Fatigue/lethargy limiting ability to participate   Pt agreed to 10 am next day  Alba Cory 03/23/2018, 7:13 PM

## 2018-03-24 DIAGNOSIS — N183 Chronic kidney disease, stage 3 (moderate): Secondary | ICD-10-CM | POA: Diagnosis not present

## 2018-03-24 DIAGNOSIS — R55 Syncope and collapse: Secondary | ICD-10-CM | POA: Diagnosis not present

## 2018-03-24 DIAGNOSIS — N179 Acute kidney failure, unspecified: Secondary | ICD-10-CM | POA: Diagnosis not present

## 2018-03-24 LAB — BASIC METABOLIC PANEL
Anion gap: 8 (ref 5–15)
BUN: 13 mg/dL (ref 8–23)
CO2: 25 mmol/L (ref 22–32)
Calcium: 8.5 mg/dL — ABNORMAL LOW (ref 8.9–10.3)
Chloride: 97 mmol/L — ABNORMAL LOW (ref 98–111)
Creatinine, Ser: 0.92 mg/dL (ref 0.44–1.00)
GFR calc Af Amer: >60 mL/min (ref >60)
GFR calc non Af Amer: 55 mL/min — ABNORMAL LOW (ref >60)
Glucose, Bld: 97 mg/dL (ref 70–99)
Potassium: 3.6 mmol/L (ref 3.5–5.1)
SODIUM: 130 mmol/L — AB (ref 135–145)

## 2018-03-24 LAB — CBC WITH DIFFERENTIAL/PLATELET
Abs Immature Granulocytes: 0.03 10*3/uL (ref 0.00–0.07)
BASOS PCT: 0 %
Basophils Absolute: 0 10*3/uL (ref 0.0–0.1)
Eosinophils Absolute: 0.2 10*3/uL (ref 0.0–0.5)
Eosinophils Relative: 4 %
HCT: 28 % — ABNORMAL LOW (ref 36.0–46.0)
Hemoglobin: 8.2 g/dL — ABNORMAL LOW (ref 12.0–15.0)
Immature Granulocytes: 1 %
Lymphocytes Relative: 20 %
Lymphs Abs: 1 10*3/uL (ref 0.7–4.0)
MCH: 27.4 pg (ref 26.0–34.0)
MCHC: 29.3 g/dL — ABNORMAL LOW (ref 30.0–36.0)
MCV: 93.6 fL (ref 80.0–100.0)
Monocytes Absolute: 0.7 10*3/uL (ref 0.1–1.0)
Monocytes Relative: 14 %
NEUTROS ABS: 3.1 10*3/uL (ref 1.7–7.7)
Neutrophils Relative %: 61 %
PLATELETS: 178 10*3/uL (ref 150–400)
RBC: 2.99 MIL/uL — ABNORMAL LOW (ref 3.87–5.11)
RDW: 13.4 % (ref 11.5–15.5)
WBC: 5 10*3/uL (ref 4.0–10.5)
nRBC: 0 % (ref 0.0–0.2)

## 2018-03-24 LAB — BRAIN NATRIURETIC PEPTIDE: B Natriuretic Peptide: 147.7 pg/mL — ABNORMAL HIGH (ref 0.0–100.0)

## 2018-03-24 LAB — GLUCOSE, CAPILLARY
GLUCOSE-CAPILLARY: 91 mg/dL (ref 70–99)
Glucose-Capillary: 90 mg/dL (ref 70–99)

## 2018-03-24 MED ORDER — FUROSEMIDE 10 MG/ML IJ SOLN
20.0000 mg | Freq: Every day | INTRAMUSCULAR | Status: DC
  Administered 2018-03-24: 20 mg via INTRAVENOUS
  Filled 2018-03-24: qty 2

## 2018-03-24 MED ORDER — GUAIFENESIN ER 600 MG PO TB12
1200.0000 mg | ORAL_TABLET | Freq: Two times a day (BID) | ORAL | Status: DC
  Administered 2018-03-24 (×2): 1200 mg via ORAL
  Filled 2018-03-24 (×2): qty 2

## 2018-03-24 MED ORDER — AMOXICILLIN-POT CLAVULANATE 875-125 MG PO TABS
1.0000 | ORAL_TABLET | Freq: Two times a day (BID) | ORAL | Status: DC
  Administered 2018-03-24: 1 via ORAL
  Filled 2018-03-24: qty 1

## 2018-03-24 MED ORDER — SODIUM CHLORIDE 3 % IN NEBU
4.0000 mL | INHALATION_SOLUTION | Freq: Three times a day (TID) | RESPIRATORY_TRACT | Status: DC
  Administered 2018-03-24 (×2): 4 mL via RESPIRATORY_TRACT
  Filled 2018-03-24 (×3): qty 4

## 2018-03-24 MED ORDER — GERHARDT'S BUTT CREAM: 1.0000 "application " | TOPICAL_CREAM | Freq: Four times a day (QID) | CUTANEOUS | 0 refills | Status: AC

## 2018-03-24 MED ORDER — AMOXICILLIN-POT CLAVULANATE 875-125 MG PO TABS: 1.0000 | ORAL_TABLET | Freq: Two times a day (BID) | ORAL | 0 refills | Status: AC

## 2018-03-24 MED ORDER — IPRATROPIUM-ALBUTEROL 0.5-2.5 (3) MG/3ML IN SOLN
3.0000 mL | Freq: Four times a day (QID) | RESPIRATORY_TRACT | Status: DC
  Administered 2018-03-24 (×2): 3 mL via RESPIRATORY_TRACT
  Filled 2018-03-24 (×3): qty 3

## 2018-03-24 MED ORDER — FERROUS SULFATE 325 (65 FE) MG PO TABS: 325.0000 mg | ORAL_TABLET | Freq: Every day | ORAL | 0 refills | Status: AC

## 2018-03-24 MED ORDER — SODIUM CHLORIDE 0.9 % IV SOLN
3.0000 g | Freq: Four times a day (QID) | INTRAVENOUS | Status: DC
  Administered 2018-03-24: 3 g via INTRAVENOUS
  Filled 2018-03-24 (×3): qty 3

## 2018-03-24 MED ORDER — ALBUTEROL SULFATE (2.5 MG/3ML) 0.083% IN NEBU: 2.5000 mg | INHALATION_SOLUTION | Freq: Four times a day (QID) | RESPIRATORY_TRACT | 0 refills | Status: AC | PRN

## 2018-03-24 MED ORDER — GUAIFENESIN ER 600 MG PO TB12: 1200.0000 mg | ORAL_TABLET | Freq: Two times a day (BID) | ORAL | 0 refills | Status: AC

## 2018-03-24 MED ORDER — ENOXAPARIN SODIUM 40 MG/0.4ML ~~LOC~~ SOLN
40.0000 mg | Freq: Every day | SUBCUTANEOUS | Status: DC
  Administered 2018-03-24: 40 mg via SUBCUTANEOUS
  Filled 2018-03-24: qty 0.4

## 2018-03-24 NOTE — Evaluation (Signed)
 Occupational Therapy Evaluation Patient Details Name: [name] MRN: [id number] DOB: 09/19/1926 Today's Date: 03/24/2018    History of Present Illness 85 yo female admitted with syncope, AKI. Hx of OA, Afib, pacemaker, syncope, 2* AV block, Parkinson's disease, chronic anemia   Clinical Impression   Pt admitted with syncope. Pt currently with functional limitations due to the deficits listed below (see OT Problem List).  Pt will benefit from skilled OT to increase their safety and independence with ADL and functional mobility for ADL to facilitate discharge to venue listed below.      Follow Up Recommendations  SNF    Equipment Recommendations  None recommended by OT    Recommendations for Other Services       Precautions / Restrictions Precautions Precautions: Fall      Mobility Bed Mobility   Bed Mobility: Supine to Sit;Sit to Supine     Supine to sit: Max assist Sit to supine: Max assist      Transfers Overall transfer level: Needs assistance Equipment used: 2 person hand held assist Transfers: Sit to/from Stand Sit to Stand: +2 physical assistance;+2 safety/equipment              Balance Overall balance assessment: History of Falls                                         ADL either performed or assessed with clinical judgement   ADL Overall ADL's : Needs assistance/impaired Eating/Feeding: Minimal assistance;Sitting   Grooming: Minimal assistance;Sitting;Bed level   Upper Body Bathing: Moderate assistance;Sitting   Lower Body Bathing: +2 for physical assistance;Cueing for safety;Cueing for sequencing;Sit to/from stand;Total assistance   Upper Body Dressing : Minimal assistance;Sitting   Lower Body Dressing: +2 for physical assistance;+2 for safety/equipment;Total assistance;Sit to/from stand                 General ADL Comments: Pt with lean to the left in sitting.  With increase time and VC pt able to correct  but needed min- mod  A with sitting balance     Vision         Perception     Praxis      Pertinent Vitals/Pain Pain Assessment: No/denies pain(constipation) Faces Pain Scale: Hurts little more Pain Location: L neck area with OT Aing pt hold neck at midline     Hand Dominance     Extremity/Trunk Assessment Upper Extremity Assessment Upper Extremity Assessment: Generalized weakness           Communication     Cognition Arousal/Alertness: Awake/alert Behavior During Therapy: Flat affect Overall Cognitive Status: Within Functional Limits for tasks assessed                                     General Comments   Pt needs more A than family can provide.  Will need ST SNF for rehab.            Home Living Family/patient expects to be discharged to:: Skilled nursing facility                                                 OT Problem List:  Decreased strength;Decreased activity tolerance;Impaired balance (sitting and/or standing);Decreased safety awareness;Decreased knowledge of use of DME or AE      OT Treatment/Interventions: Self-care/ADL training;Patient/family education;Therapeutic activities;DME and/or AE instruction;Therapeutic exercise    OT Goals(Current goals can be found in the care plan section) Acute Rehab OT Goals Patient Stated Goal: go to rehab OT Goal Formulation: With patient Time For Goal Achievement:  Potential to Achieve Goals: Good  OT Frequency: Min 2X/week    AM-PAC OT "6 Clicks" Daily Activity     Outcome Measure Help from another person eating meals?: A Little Help from another person taking care of personal grooming?: A Little Help from another person toileting, which includes using toliet, bedpan, or urinal?: Total Help from another person bathing (including washing, rinsing, drying)?: A Lot Help from another person to put on and taking off regular upper body clothing?: A Lot Help from  another person to put on and taking off regular lower body clothing?: Total 6 Click Score: 12   End of Session Equipment Utilized During Treatment: Gait belt Nurse Communication: Mobility status  Activity Tolerance: Patient tolerated treatment well Patient left: in bed;with call bell/phone within reach;with nursing/sitter in room;with family/visitor present;with bed alarm set  OT Visit Diagnosis: Unsteadiness on feet (R26.81);Other abnormalities of gait and mobility (R26.89);History of falling (Z91.81);Muscle weakness (generalized) (M62.81)                Time: 4709-6283 OT Time Calculation (min): 24 min Charges:  OT General Charges $OT Visit: 1 Visit OT Evaluation $OT Eval Moderate Complexity: 1 Mod OT Treatments $Self Care/Home Management : 8-22 mins  Lise Auer, OT Acute Rehabilitation Services [phone] Office- [phone]     Albaraa Swingle, Karin Golden D 03/24/2018, 3:44 PM

## 2018-03-24 NOTE — Evaluation (Signed)
Clinical/Bedside Swallow Evaluation Patient Details  Name: [name] MRN: [id number] Date of Birth: [birth year]  Today's Date: 03/24/2018 Time: SLP Start Time (ACUTE ONLY): 1145 SLP Stop Time (ACUTE ONLY): 1205 SLP Time Calculation (min) (ACUTE ONLY): 20 min  Past Medical History:  Past Medical History:  Diagnosis Date  . Arthritis   . High cholesterol   . Hypertension   . Paroxysmal atrial fibrillation (HCC)    a. identified on device interrogation 03/2017, all episodes to date <1 minute  . S/P hysterectomy   . Second degree AV block    a. s/p STJ dual chamber PPM Dr Johney Frame  . Syncope    Past Surgical History:  Past Surgical History:  Procedure Laterality Date  . ABDOMINAL HYSTERECTOMY    . EP IMPLANTABLE DEVICE N/A 11/23/2015   SJM Assurity DR pacemaker implanted by Dr Johney Frame for second degree AV block with presyncope  . LOOP RECORDER EXPLANT N/A 03/13/2012   Procedure: LOOP RECORDER EXPLANT;  Surgeon: Marinus Maw, MD;  Location: [facility] CATH LAB;  Service: Cardiovascular;  Laterality: N/A;  . REPLACEMENT TOTAL KNEE Left    HPI:  [name] is a 85 y.o. female with medical history significant of hypertension, hyperlipidemia, PAF not on anticoagulants, pacemaker placement due to complete heart block, Parkinson's disease, CKD 3. Patient presented to ED with syncope. Head CT negative for CVA. No history of dysphagia.    Assessment / Plan / Recommendation Clinical Impression  Patient seen for bedside swallow after experiencing a cough/congestion for 1/5-2 weeks and an episode of syncope. Patient's daughter was at bedside and reported patient has not difficulty with eating, but has a poor appetite at this time. She demonstrated drinking water from a cup with straw with sequecial sips. No change in respirations or vocal quality. She had some anterior spillage when she attempted the same task. However, she is currently complaining of a stiff neck and leaning to that side. No  oral spillage noticed with straw or other consistencies. No s/sx of aspiration noticed with purees or solids. Recommend patient continue reg diet with thin liquids. Speech therapy to follow briefly for diet tolerance or need for instrumental testing.  SLP Visit Diagnosis: Dysphagia, unspecified (R13.10)    Aspiration Risk  Mild aspiration risk    Diet Recommendation Regular;Thin liquid   Liquid Administration via: Straw;Cup Medication Administration: Whole meds with liquid Supervision: Patient able to self feed Compensations: Slow rate;Small sips/bites    Other  Recommendations Oral Care Recommendations: Oral care BID   Follow up Recommendations        Frequency and Duration min 2x/week  1 week       Prognosis Prognosis for Safe Diet Advancement: Good      Swallow Study   General Date of Onset: 03/21/18 HPI: [name] is a 85 y.o. female with medical history significant of hypertension, hyperlipidemia, PAF not on anticoagulants, pacemaker placement due to complete heart block, Parkinson's disease, CKD 3. Patient presented to ED with syncope. Head CT negative for CVA. No history of dysphagia.  Type of Study: Bedside Swallow Evaluation Previous Swallow Assessment: none Diet Prior to this Study: Regular;Thin liquids Temperature Spikes Noted: No Respiratory Status: Room air History of Recent Intubation: No Behavior/Cognition: Alert;Cooperative Oral Cavity Assessment: Within Functional Limits Oral Care Completed by SLP: No Oral Cavity - Dentition: Dentures, top;Dentures, bottom Vision: Functional for self-feeding Self-Feeding Abilities: Able to feed self Patient Positioning: Upright in bed Baseline Vocal Quality: Normal Volitional Cough: Wet;Congested Volitional Swallow:  Able to elicit    Oral/Motor/Sensory Function Overall Oral Motor/Sensory Function: Within functional limits   Ice Chips Ice chips: Not tested   Thin Liquid Thin Liquid: Within functional  limits Presentation: Cup;Straw    Nectar Thick Nectar Thick Liquid: Not tested   Honey Thick Honey Thick Liquid: Not tested   Puree Puree: Within functional limits Presentation: Spoon   Solid     Solid: Within functional limits Presentation: Self Fed      Lindalou Hose Dennette Faulconer, MA, CCC-SLP 03/24/2018 1:21 PM

## 2018-03-24 NOTE — Discharge Summary (Signed)
Discharge Summary  [name] ZOX:[id number] DOB: 08-22-1926  PCP: [name], MD  Admit date: 03/21/2018 Discharge date: 03/24/2018  Time spent: 35 minutes  Recommendations for Outpatient Follow-up:  1. Follow-up with your PCP 2. Take your medications as prescribed 3. Continue physical therapy 4. Fall precautions  Discharge Diagnoses:  Active Hospital Problems   Diagnosis Date Noted  . Syncope 03/22/2018  . Acute renal failure superimposed on stage 3 chronic kidney disease (HCC) 03/22/2018  . Atrial fibrillation, chronic 03/22/2018  . Pacemaker 03/22/2018  . Cough 03/22/2018  . Essential hypertension 01/31/2018  . Parkinson's disease (HCC) 01/31/2018  . Hyperlipidemia 07/15/2008    Resolved Hospital Problems  No resolved problems to display.    Discharge Condition: Stable  Diet recommendation: Resume previous diet  Vitals:   03/24/18 0518 03/24/18 1317  BP: (!) 136/50 (!) 162/65  Pulse: 73 91  Resp: 17 20  Temp: 98 F (36.7 C) 98.9 F (37.2 C)  SpO2: 96% 94%    History of present illness:  Robin G Herringis a 85 y.o.femalewith medical history significant ofhypertension, hyperlipidemia, PAF not on anticoagulant, pacemaker placement due to complete heart block, Parkinson's disease, CKD 3, who presents with syncope.  Per patient's daughter,ptpassed out for few seconds when she wasambulating to the bathroom with family assistance.Pt family denies that she had any falls or injury. Pt also reports to havehad a BM while ambulating, and was reported to have black/tar like stools.No prodromal symptoms. Denies unilateral weakness, numbness or tingling inextremities. No facial droop or slurred speech. Denies any chest pain,shortness of breath. Patient has mild cough with little mucus production. No fever or chills. No runny nose or sore throat. Patient does not have nausea, vomiting, diarrhea or abdominal pain currently. Denies symptoms of  UTI.   Reviewed labs which revealed sharp drop in hemoglobin from 12to8.4. Repeat H&H. FOBT negative. Lab studies also remarkable for hypokalemia with potassium 3.3 which was repleted. Bilateral carotid Doppler ultrasound negative for any significant carotid artery stenosis. Negative orthostatic vital signs.  Hospital course complicated by suspicion for aspiration pneumonia.  Procalcitonin mildly elevated with persistent productive cough.  Independently reviewed chest x-ray done on admission which revealed right middle and right lower lobe infiltrates.  Started IV Unasyn to cover for aspiration.  Speech therapy consulted, recommended regular diet with thin liquid and mild aspiration risk.    03/24/2018: Patient seen and examined at her bedside.  No acute events overnight.  Afebrile with no leukocytosis.  Cough is persistent but improving.  We will switch IV antibiotic to oral, Augmentin, and discharged home to follow-up with her primary care provider within a week.   Hospital Course:  Principal Problem:   Syncope Active Problems:   Hyperlipidemia   Parkinson's disease (HCC)   Essential hypertension   Acute renal failure superimposed on stage 3 chronic kidney disease (HCC)   Atrial fibrillation, chronic   Pacemaker   Cough  Syncope, unclear etiology Work-up unrevealing Negative orthostatic vital signs Negative CT head Negative carotid Doppler ultrasound Urine analysis negative PT recommended home health PT with 24-hour supervision  Suspected aspiration pneumonitis versus early aspiration pneumonia Independent reviewed chest x-ray done on admission which revealed right lower and right middle lobe infiltrates Mildly elevated procalcitonin 0.23 Speech therapist consulted to evaluate for dysphagia, mild aspiration risk with recommendation for regular consistency diet and thin liquid Continue aspiration precautions  Chronic normocytic anemia Hemoglobin is stable 8.7 from  8.4 No sign of overt bleeding Negative FOBT Iron studies remarkable  for deficiency Continue ferrous sulfate 325 mg daily Follow-up with your primary care provider  Hypomagnesemia, repleted Presented with magnesium 1.7 Repleted with IV magnesium 2 g once  Ambulatory dysfunction/physical debility PT recommend home health PT with 24-hour supervision Fall precaution Continue physical therapy as tolerated  Hyperlipidemia Continue Zocor  AoCKD-III:  Presented with creatinine of 1.33 Back to her baseline 0.92 on 03/24/2018 with GFR greater than 60 Continue to hold off Hyzaar  Parkinson's disease (HCC): -Continue Sinemet  Essential hypertension: -HoldHyzaar due to recent AKI -Continue nifedipine -Follow-up with your primary care provider  Atrial Fibrillation(PAF): CHA2DS2-VASc Scoreis 4 Not on AC,possibly due to high risk for fall secondary to Parkinson's disease. -continue aspirin   Code Status:Full code     Discharge Exam: BP (!) 162/65 (BP Location: Right Arm)   Pulse 91   Temp 98.9 F (37.2 C) (Oral)   Resp 20   Ht 5\' 3"  (1.6 m)   Wt 61 kg   SpO2 94%   BMI 23.82 kg/m  . General: 85 y.o. year-old female well developed well nourished in no acute distress.  Alert and oriented x3. . Cardiovascular: Regular rate and rhythm with no rubs or gallops.  No thyromegaly or JVD noted.   Marland Kitchen Respiratory: Mild rales at bases with no wheezes. Good inspiratory effort. . Abdomen: Soft nontender nondistended with normal bowel sounds x4 quadrants. . Musculoskeletal: Trace lower extremity edema. 2/4 pulses in all 4 extremities. Marland Kitchen Psychiatry: Mood is appropriate for condition and setting  Discharge Instructions You were cared for by a hospitalist during your hospital stay. If you have any questions about your discharge medications or the care you received while you were in the hospital after you are discharged, you can call the unit and asked to speak with the  hospitalist on call if the hospitalist that took care of you is not available. Once you are discharged, your primary care physician will handle any further medical issues. Please note that NO REFILLS for any discharge medications will be authorized once you are discharged, as it is imperative that you return to your primary care physician (or establish a relationship with a primary care physician if you do not have one) for your aftercare needs so that they can reassess your need for medications and monitor your lab values.  Discharge Instructions    DME Nebulizer machine   Complete by:  As directed    Patient needs a nebulizer to treat with the following condition:  Aspiration pneumonia (HCC)     Allergies as of 03/24/2018   No Known Allergies     Medication List    STOP taking these medications   losartan-hydrochlorothiazide 50-12.5 MG tablet Commonly known as:  HYZAAR     TAKE these medications   albuterol (2.5 MG/3ML) 0.083% nebulizer solution Commonly known as:  PROVENTIL Take 3 mLs (2.5 mg total) by nebulization every 6 (six) hours as needed for wheezing or shortness of breath.   amoxicillin-clavulanate 875-125 MG tablet Commonly known as:  AUGMENTIN Take 1 tablet by mouth every 12 (twelve) hours for 6 days.   aspirin EC 81 MG tablet Take 81 mg by mouth every evening.   calcium-vitamin D 500-200 MG-UNIT tablet Commonly known as:  OSCAL WITH D Take 1 tablet by mouth 2 (two) times daily.   Carbidopa-Levodopa ER 25-100 MG tablet controlled release Commonly known as:  SINEMET CR Take 1 tablet by mouth 3 (three) times daily.   docusate sodium 100 MG capsule Commonly known as:  COLACE Take 100 mg by mouth daily as needed for mild constipation.   ergocalciferol 1.25 MG (50000 UT) capsule Commonly known as:  VITAMIN D2 Take 50,000 Units by mouth every 14 (fourteen) days.   ferrous sulfate 325 (65 FE) MG tablet Take 1 tablet (325 mg total) by mouth daily with  breakfast. Start taking on:  March 25, 2018   Gerhardt's butt cream Crea Apply 1 application topically 4 (four) times daily.   guaiFENesin 600 MG 12 hr tablet Commonly known as:  MUCINEX Take 2 tablets (1,200 mg total) by mouth 2 (two) times daily.   NIFEdipine 30 MG 24 hr tablet Commonly known as:  PROCARDIA-XL/NIFEDICAL-XL TAKE 1 TABLET(30 MG) BY MOUTH DAILY What changed:  See the new instructions.   ondansetron 4 MG tablet Commonly known as:  ZOFRAN Take 1 tablet (4 mg total) by mouth every 6 (six) hours as needed for nausea.   simvastatin 20 MG tablet Commonly known as:  ZOCOR Take 20 mg by mouth at bedtime.   SYSTANE 0.4-0.3 % Soln Generic drug:  Polyethyl Glycol-Propyl Glycol Place 1 drop into both eyes 2 (two) times daily.   vitamin B-12 500 MCG tablet Commonly known as:  CYANOCOBALAMIN Take 500 mcg by mouth daily with breakfast.            Durable Medical Equipment  (From admission, onward)         Start     Ordered   03/24/18 0000  DME Nebulizer machine    Question:  Patient needs a nebulizer to treat with the following condition  Answer:  Aspiration pneumonia (HCC)   03/24/18 1413         No Known Allergies Follow-up Information    Jarome MatinPaterson, Daniel, MD. Call in 1 day(s).   Specialty:  Internal Medicine Why:  Please call for a post hospital follow-up appointment. Contact information: [address] [name] [address] [phone]            The results of significant diagnostics from this hospitalization (including imaging, microbiology, ancillary and laboratory) are listed below for reference.    Significant Diagnostic Studies: Ct Head Wo Contrast  Result Date: 03/22/2018 CLINICAL DATA:  Syncope EXAM: CT HEAD WITHOUT CONTRAST TECHNIQUE: Contiguous axial images were obtained from the base of the skull through the vertex without intravenous contrast. COMPARISON:  Head CT 01/31/2018 FINDINGS: Brain: There is no mass, hemorrhage or  extra-axial collection. The size and configuration of the ventricles and extra-axial CSF spaces are normal. There is hypoattenuation of the white matter, most commonly indicating chronic small vessel disease. Vascular: Atherosclerotic calcification of the internal carotid arteries at the skull base. No abnormal hyperdensity of the major intracranial arteries or dural venous sinuses. Skull: The visualized skull base, calvarium and extracranial soft tissues are normal. Sinuses/Orbits: No fluid levels or advanced mucosal thickening of the visualized paranasal sinuses. No mastoid or middle ear effusion. The orbits are normal. IMPRESSION: 1. No acute intracranial abnormality. 2. Chronic small vessel disease. Electronically Signed   By: Deatra RobinsonKevin  Herman M.D.   On: 03/22/2018 03:24   Dg Chest Port 1 View  Result Date: 03/22/2018 CLINICAL DATA:  Cough. Weakness. EXAM: PORTABLE CHEST 1 VIEW COMPARISON:  Frontal and lateral views 02/13/2018, additional prior exams. FINDINGS: Left-sided pacemaker in place. Cardiomegaly unchanged from prior. Unchanged mediastinal contours. Interstitial opacities in the mid to lower lung zone predominance, similar to exams last month. Abdominal CT 01/30/2017 demonstrated bronchiectasis and ground-glass opacities. Demonstrated no evidence of new airspace disease  or pulmonary edema. Difficult to assess for pleural effusions. No pneumothorax. The bones are under mineralized. IMPRESSION: 1. Mid and lower lung zone interstitial opacities corresponding to bronchiectasis and interstitial lung disease. This is unchanged over the last month, and may have progressed from 2017 radiograph. 2. Stable cardiomegaly. Electronically Signed   By: Narda Rutherford M.D.   On: 03/22/2018 03:06   Vas US Carotid  Result Date: 03/22/2018 Carotid Arterial Duplex Study Indications: Syncope. Limitations: poor patient position, nurse had to hold patients head for right              side evaluation. Performing  Technologist: Levada Schilling RDMS, RVT  Examination Guidelines: A complete evaluation includes B-mode imaging, spectral Doppler, color Doppler, and power Doppler as needed of all accessible portions of each vessel. Bilateral testing is considered an integral part of a complete examination. Limited examinations for reoccurring indications may be performed as noted.  Right Carotid Findings: +----------+--------+--------+--------+---------------------+--------+           PSV cm/sEDV cm/sStenosisDescribe             Comments +----------+--------+--------+--------+---------------------+--------+ CCA Prox  132     13                                            +----------+--------+--------+--------+---------------------+--------+ CCA Distal107     20              calcific and focal            +----------+--------+--------+--------+---------------------+--------+ ICA Prox  129     13              focal and hyperechoic         +----------+--------+--------+--------+---------------------+--------+ ICA Mid   95      10                                            +----------+--------+--------+--------+---------------------+--------+ ICA Distal118     13                                            +----------+--------+--------+--------+---------------------+--------+ ECA       125     17                                            +----------+--------+--------+--------+---------------------+--------+ +----------+--------+-------+--------+-------------------+           PSV cm/sEDV cmsDescribeArm Pressure (mmHG) +----------+--------+-------+--------+-------------------+ Subclavian190                                        +----------+--------+-------+--------+-------------------+ +---------+--------+--+--------+-+---------+ VertebralPSV cm/s75EDV cm/s9Antegrade +---------+--------+--+--------+-+---------+  Left Carotid Findings:  +----------+--------+--------+--------+-------------------------------+--------+           PSV cm/sEDV cm/sStenosisDescribe                       Comments +----------+--------+--------+--------+-------------------------------+--------+ CCA Prox  118     17                                                      +----------+--------+--------+--------+-------------------------------+--------+  CCA Distal117     11                                                      +----------+--------+--------+--------+-------------------------------+--------+ ICA Prox  137     20              diffuse, hyperechoic and                                                  heterogenous                            +----------+--------+--------+--------+-------------------------------+--------+ ICA Mid   147     19                                             tortuous +----------+--------+--------+--------+-------------------------------+--------+ ICA Distal116     21                                                      +----------+--------+--------+--------+-------------------------------+--------+ ECA       167     22              hyperechoic                             +----------+--------+--------+--------+-------------------------------+--------+ +----------+--------+--------+--------+-------------------+ SubclavianPSV cm/sEDV cm/sDescribeArm Pressure (mmHG) +----------+--------+--------+--------+-------------------+           199                                         +----------+--------+--------+--------+-------------------+ +---------+--------+--+--------+--+---------+ VertebralPSV cm/s62EDV cm/s10Antegrade +---------+--------+--+--------+--+---------+  Summary: Right Carotid: Velocities in the right ICA are consistent with a 1-39% stenosis. Left Carotid: Velocities in the left ICA are consistent with a 1-39% stenosis. Vertebrals: Bilateral vertebral arteries  demonstrate antegrade flow. *See table(s) above for measurements and observations.  Electronically signed by Lemar Livings MD on 03/22/2018 at 3:07:43 PM.    Final     Microbiology: No results found for this or any previous visit (from the past 240 hour(s)).   Labs: Basic Metabolic Panel: Recent Labs  Lab 03/21/18 2105 03/22/18 0518 03/23/18 0534 03/24/18 0545  NA 134* 134* 133* 130*  K 4.0 3.3* 3.8 3.6  CL 97* 101 99 97*  CO2 25 26 26 25   GLUCOSE 147* 148* 91 97  BUN 25* 22 18 13   CREATININE 1.33* 1.10* 1.01* 0.92  CALCIUM 9.8 8.8* 8.7* 8.5*  MG  --   --  1.7  --    Liver Function Tests: No results for input(s): AST, ALT, ALKPHOS, BILITOT, PROT, ALBUMIN in the last 168 hours. No results for input(s): LIPASE, AMYLASE in the last 168 hours. No results for input(s): AMMONIA in the last 168 hours. CBC: Recent  Labs  Lab 03/21/18 2105 03/22/18 0518 03/22/18 0706 03/23/18 0534 03/24/18 0545  WBC 5.5 6.4  --  5.4 5.0  NEUTROABS  --   --   --   --  3.1  HGB 12.1 8.4* 9.5* 8.4* 8.2*  HCT 39.9 28.6* 31.3* 27.7* 28.0*  MCV 94.8 96.6  --  93.0 93.6  PLT 170 152  --  161 178   Cardiac Enzymes: No results for input(s): CKTOTAL, CKMB, CKMBINDEX, TROPONINI in the last 168 hours. BNP: BNP (last 3 results) Recent Labs    01/30/18 2005 03/24/18 0545  BNP 978.0* 147.7*    ProBNP (last 3 results) No results for input(s): PROBNP in the last 8760 hours.  CBG: Recent Labs  Lab 03/21/18 2135 03/22/18 0752 03/23/18 0750 03/24/18 0641 03/24/18 0740  GLUCAP 135* 106* 76 91 90       Signed:  Darlin Droparole N Angella Montas, MD [facility] 03/24/2018, 2:14 PM

## 2018-03-24 NOTE — Progress Notes (Signed)
PT continues to demonstrate hands on ability to utilize Flutter device. NPC at this time.

## 2018-03-24 NOTE — Discharge Instructions (Signed)
Syncope Syncope is when you pass out (faint) for a short time. It is caused by a sudden decrease in blood flow to the brain. Signs that you may be about to pass out include:  Feeling dizzy or light-headed.  Feeling sick to your stomach (nauseous).  Seeing all white or all black.  Having cold, clammy skin. If you pass out, get help right away. Call your local emergency services (911 in the [place] Do not drive yourself to the hospital. Follow these instructions at home: Watch for any changes in your symptoms. Take these actions to stay safe and help with your symptoms: Lifestyle  Do not drive, use machinery, or play sports until your doctor says it is okay.  Do not drink alcohol.  Do not use any products that contain nicotine or tobacco, such as cigarettes and e-cigarettes. If you need help quitting, ask your doctor.  Drink enough fluid to keep your pee (urine) pale yellow. General instructions  Take over-the-counter and prescription medicines only as told by your doctor.  If you are taking blood pressure or heart medicine, sit up and stand up slowly. Spend a few minutes getting ready to sit and then stand. This can help you feel less dizzy.  Have someone stay with you until you feel stable.  If you start to feel like you might pass out, lie down right away and raise (elevate) your feet above the level of your heart. Breathe deeply and steadily. Wait until all of the symptoms are gone.  Keep all follow-up visits as told by your doctor. This is important. Get help right away if:  You have a very bad headache.  You pass out once or more than once.  You have pain in your chest, belly, or back.  You have a very fast or uneven heartbeat (palpitations).  It hurts to breathe.  You are bleeding from your mouth or your bottom (rectum).  You have black or tarry poop (stool).  You have jerky movements that you cannot control (seizure).  You are confused.  You have trouble  walking.  You are very weak.  You have vision problems. These symptoms may be an emergency. Do not wait to see if the symptoms will go away. Get medical help right away. Call your local emergency services (911 in the [place] Do not drive yourself to the hospital. Summary  Syncope is when you pass out (faint) for a short time. It is caused by a sudden decrease in blood flow to the brain.  Signs that you may be about to faint include feeling dizzy, light-headed, or sick to your stomach, seeing all white or all black, or having cold, clammy skin.  If you start to feel like you might pass out, lie down right away and raise (elevate) your feet above the level of your heart. Breathe deeply and steadily. Wait until all of the symptoms are gone. This information is not intended to replace advice given to you by your health care provider. Make sure you discuss any questions you have with your health care provider. Document Released: 07/31/2007 Document Revised: 03/26/2017 Document Reviewed: 03/26/2017 Elsevier Interactive Patient Education  2019 [employer]   Near-Syncope Near-syncope is when you suddenly get weak or dizzy, or you feel like you might pass out (faint). This is due to a lack of blood flow to the brain. During an episode of near-syncope, you may:  Feel dizzy or light-headed.  Feel sick to your stomach (nauseous).  See all white  or all black.  Have cold, clammy skin. This condition is caused by a sudden decrease in blood flow to the brain. This decrease can result from various causes, but most of those causes are not dangerous. However, near-syncope may be a sign of a serious medical problem, so it is important to seek medical care. Follow these instructions at home: Pay attention to any changes in your symptoms. Take these actions to help with your condition:  Have someone stay with you until you feel stable.  Talk with your doctor about your symptoms. You may need to have  testing to understand the cause of your near-syncope.  Do not drive, use machinery, or play sports until your doctor says it is okay.  Keep all follow-up visits as told by your doctor. This is important.  If you start to feel like you might pass out, lie down right away and raise (elevate) your feet above the level of your heart. Breathe deeply and steadily. Wait until all of the symptoms are gone.  Drink enough fluid to keep your pee (urine) pale yellow. Medicines  If you are taking blood pressure or heart medicine, get up slowly and spend many minutes getting ready to sit and then stand. This can help with dizziness.  Take over-the-counter and prescription medicines only as told by your doctor. Get help right away if you:  Have a seizure.  Have pain in your: ? Chest. ? Tummy, ? Back.  Faint.  Have a bad headache.  Are bleeding from your mouth or butt.  Have black or tarry poop (stool).  Have a very fast or uneven heartbeat (palpitations).  Are confused.  Have trouble walking.  Are very weak.  Have trouble seeing. These symptoms may represent a serious problem that is an emergency. Do not wait to see if your symptoms will go away. Get medical help right away. Call your local emergency services (911 in the [place] Do not drive yourself to the hospital. Summary  Near-syncope is when you suddenly get weak or dizzy, or you feel like you might pass out.  This condition is caused by a lack of blood flow to the brain.  Near-syncope may be a sign of a serious medical problem, so it is important to seek medical care. This information is not intended to replace advice given to you by your health care provider. Make sure you discuss any questions you have with your health care provider. Document Released: 07/31/2007 Document Revised: 10/07/2017 Document Reviewed: 10/26/2014 Elsevier Interactive Patient Education  2019 [employer] Inc[employer]   Dehydration  Dehydration is when there  is not enough fluid or water in your body. This happens when you lose more fluids than you take in. People who are age 53 or older have a higher risk of getting dehydrated. Dehydration can range from mild to very bad. It should be treated right away to keep it from getting very bad. Symptoms of mild dehydration may include:  Thirst.  Dry lips.  Slightly dry mouth.  Dry, warm skin.  Dizziness. Symptoms of moderate dehydration may include:  Very dry mouth.  Muscle cramps.  Dark pee (urine). Pee may be the color of tea.  Your body making less pee.  Your eyes making fewer tears.  Heartbeat that is uneven or faster than normal (palpitations).  Headache.  Light-headedness, especially when you stand up from sitting.  Fainting (syncope). Symptoms of very bad dehydration may include:  Changes in skin, such as: ? Cold and clammy skin. ?  Blotchy (mottled) or pale skin. ? Skin that does not quickly return to normal after being lightly pinched and let go (poor skin turgor).  Changes in body fluids, such as: ? Feeling very thirsty. ? Your eyes making fewer tears. ? Not sweating when body temperature is high, such as in hot weather. ? Your body making very little pee.  Changes in vital signs, such as: ? Weak pulse. ? Pulse that is more than 100 beats a minute when you are sitting still. ? Fast breathing. ? Low blood pressure.  Other changes, such as: ? Sunken eyes. ? Cold hands and feet. ? Confusion. ? Lack of energy (lethargy). ? Trouble waking up from sleep. ? Short-term weight loss. ? Unconsciousness. Follow these instructions at home:   If told by your doctor, drink an ORS: ? Make an ORS by using instructions on the package. ? Start by drinking small amounts, about  cup (120 mL) every 5-10 minutes. ? Slowly drink more until you have had the amount that your doctor said to have.  Drink enough clear fluid to keep your pee clear or pale yellow. If you were told  to drink an ORS, finish the ORS first, then start slowly drinking clear fluids. Drink fluids such as: ? Water. Do not drink only water by itself. Doing that can make the salt (sodium) level in your body get too low (hyponatremia). ? Ice chips. ? Fruit juice that you have added water to (diluted). ? Low-calorie sports drinks.  Avoid: ? Alcohol. ? Drinks that have a lot of sugar. These include high-calorie sports drinks, fruit juice that does not have water added, and soda. ? Caffeine. ? Foods that are greasy or have a lot of fat or sugar.  Take over-the-counter and prescription medicines only as told by your doctor.  Do not take salt tablets. Doing that can make the salt level in your body get too high (hypernatremia).  Eat foods that have minerals (electrolytes). Examples include bananas, oranges, potatoes, tomatoes, and spinach.  Keep all follow-up visits as told by your doctor. This is important. Contact a doctor if:  You have belly (abdominal) pain that: ? Gets worse. ? Stays in one area (localizes).  You have a rash.  You have a stiff neck.  You get angry or annoyed more easily than normal (irritability).  You are more sleepy than normal.  You have a harder time waking up than normal.  You feel: ? Weak. ? Dizzy. ? Very thirsty. Get help right away if:  You have symptoms of very bad dehydration.  You cannot drink fluids without throwing up (vomiting).  Your symptoms get worse with treatment.  You have a fever.  You have a very bad headache.  You are throwing up or having watery poop (diarrhea) and it: ? Gets worse. ? Does not go away.  You have diarrhea for more than 24 hours.  You have blood or something green (bile) in your throw-up.  You have blood in your poop (stool). This may cause poop to look black and tarry.  You have not peed in 6-8 hours.  You have peed (urinated) only a small amount of very dark pee during 6-8 hours.  You pass out  (faint).  Your heart rate when you are sitting still is more than 100 beats a minute.  You have trouble breathing. This information is not intended to replace advice given to you by your health care provider. Make sure you discuss any questions you  have with your health care provider. Document Released: 01/31/2011 Document Revised: 09/01/2015 Document Reviewed: 04/07/2015 Elsevier Interactive Patient Education  2019 Else[employer] Inc.   Cough, Adult  A cough helps to clear your throat and lungs. A cough may last only 2-3 weeks (acute), or it may last longer than 8 weeks (chronic). Many different things can cause a cough. A cough may be a sign of an illness or another medical condition. Follow these instructions at home:  Pay attention to any changes in your cough.  Take medicines only as told by your doctor. ? If you were prescribed an antibiotic medicine, take it as told by your doctor. Do not stop taking it even if you start to feel better. ? Talk with your doctor before you try using a cough medicine.  Drink enough fluid to keep your pee (urine) clear or pale yellow.  If the air is dry, use a cold steam vaporizer or humidifier in your home.  Stay away from things that make you cough at work or at home.  If your cough is worse at night, try using extra pillows to raise your head up higher while you sleep.  Do not smoke, and try not to be around smoke. If you need help quitting, ask your doctor.  Do not have caffeine.  Do not drink alcohol.  Rest as needed. Contact a doctor if:  You have new problems (symptoms).  You cough up yellow fluid (pus).  Your cough does not get better after 2-3 weeks, or your cough gets worse.  Medicine does not help your cough and you are not sleeping well.  You have pain that gets worse or pain that is not helped with medicine.  You have a fever.  You are losing weight and you do not know why.  You have night sweats. Get help right away  if:  You cough up blood.  You have trouble breathing.  Your heartbeat is very fast. This information is not intended to replace advice given to you by your health care provider. Make sure you discuss any questions you have with your health care provider. Document Released: 10/25/2010 Document Revised: 07/20/2015 Document Reviewed: 04/20/2014 Elsevier Interactive Patient Education  2019 [employer].

## 2018-03-24 NOTE — Progress Notes (Signed)
Pharmacy Antibiotic Note  [name] is a 85 y.o. female admitted on 03/21/2018 with medical history significant ofhypertension, hyperlipidemia, PAF not on anticoagulants, pacemaker placement due to complete heart block, Parkinson's disease, CKD 3,  who presents with syncope.  Pharmacy has been consulted for ampicillin/sulbactaim dosing for aspiration PNA.  Assessment:   WBC WNL  Afebrile  SCr ~1, CrCl ~35 mL/min  Plan:  Increase dose of ampicillin/sulbactam to 3 g IV q6h based on renal function and indication  Continue to monitor for change to PO antibiotics  Monitor renal function and adjust dose as needed  Height: 5\' 3"  (160 cm) Weight: 134 lb 7.7 oz (61 kg) IBW/kg (Calculated) : 52.4  Temp (24hrs), Avg:98.3 F (36.8 C), Min:98 F (36.7 C), Max:99 F (37.2 C)  Recent Labs  Lab 03/21/18 2105 03/22/18 0518 03/23/18 0534 03/24/18 0545  WBC 5.5 6.4 5.4 5.0  CREATININE 1.33* 1.10* 1.01* 0.92    Estimated Creatinine Clearance: 34.3 mL/min (by C-G formula based on SCr of 0.92 mg/dL).    No Known Allergies  Antimicrobials this admission: 1/27 unasyn >>  Dose adjustments this admission: 1/28: unasyn 1.5 g IV q8h --> 3 g IV q6h  Microbiology results: None  Thank you for allowing pharmacy to be a part of this patient's care.  Cindi Carbon, PharmD 03/24/18 8:12 AM

## 2018-03-24 NOTE — Progress Notes (Signed)
PT demonstrated hands on ability to utilize Flutter device. NPC at this time.

## 2018-03-24 NOTE — Care Management Note (Signed)
Case Management Note  Patient Details  Name: [name] MRN: [id number] Date of Birth: 06/01/1926  Subjective/Objective:Spoke to dtaughter in rm-She agrees to [facility] used in past will use again-rep Clydie Braun aware of HHC-await HHC orders-recc HHRN/PT/aide. PTAR for ambulance transp @ d/c.                   Action/Plan:dc home w/HHC/PTAR   Expected Discharge Date:                  Expected Discharge Plan:  Home w Home Health Services  In-House Referral:     Discharge planning Services  CM Consult  Post Acute Care Choice:  Durable Medical Equipment(rw,3n1) Choice offered to:  Adult Children  DME Arranged:    DME Agency:     HH Arranged:    HH Agency:  [facility]  Status of Service:  In process, will continue to follow  If discussed at Long Length of Stay Meetings, dates discussed:    Additional Comments:  Lanier Clam, RN 03/24/2018, 2:08 PM

## 2018-03-24 NOTE — Progress Notes (Signed)
D/C instrcutions reviewed w/ pt dtr, Gaynell. She verbalizes understanding and all questions answered. Inc care provided to pt and pt dressed in clothes dtr provided. PTAR called for d/c transport home. Dtr aware.

## 2018-03-24 NOTE — Care Management Note (Signed)
Case Management Note  Patient Details  Name: [name] MRN: [id number] Date of Birth: 1926-04-02  Subjective/Objective:Spoke to to daughter about d/c plan again. Daughter confirmed d/c plan is home w/HHC-[facility] rep Clydie Braun aware of d/c & HHC orders. MD notified.  PTAR for ambulance transp-forms in shadow chart for nsg to call when ready.                  Action/Plan:d/c home w/HHC/PTAR.   Expected Discharge Date:  03/24/18               Expected Discharge Plan:  Home w Home Health Services  In-House Referral:     Discharge planning Services  CM Consult  Post Acute Care Choice:  Durable Medical Equipment(rw,3n1) Choice offered to:  Adult Children  DME Arranged:    DME Agency:     HH Arranged:  RN, PT, OT, Nurse's Aide HH Agency:  [facility]  Status of Service:  Completed, signed off  If discussed at Long Length of Stay Meetings, dates discussed:    Additional Comments:  Lanier Clam, RN 03/24/2018, 3:34 PM

## 2018-03-24 NOTE — Progress Notes (Signed)
RN called to room by OT.  Patient unable to stand with x2 assistance.  Patient's daughter at bedside.  OT recommending SNF rehab. Patient and patient's daughter agreed for rehab.  Dr. Margo Aye notified via text page.

## 2018-03-25 DIAGNOSIS — Z7401 Bed confinement status: Secondary | ICD-10-CM | POA: Diagnosis not present

## 2018-03-25 DIAGNOSIS — J690 Pneumonitis due to inhalation of food and vomit: Secondary | ICD-10-CM | POA: Diagnosis not present

## 2018-03-25 DIAGNOSIS — J189 Pneumonia, unspecified organism: Secondary | ICD-10-CM | POA: Diagnosis not present

## 2018-03-25 DIAGNOSIS — R55 Syncope and collapse: Secondary | ICD-10-CM | POA: Diagnosis not present

## 2018-03-25 DIAGNOSIS — N183 Chronic kidney disease, stage 3 (moderate): Secondary | ICD-10-CM | POA: Diagnosis not present

## 2018-03-25 DIAGNOSIS — M2550 Pain in unspecified joint: Secondary | ICD-10-CM | POA: Diagnosis not present

## 2018-03-25 DIAGNOSIS — I129 Hypertensive chronic kidney disease with stage 1 through stage 4 chronic kidney disease, or unspecified chronic kidney disease: Secondary | ICD-10-CM | POA: Diagnosis not present

## 2018-03-25 DIAGNOSIS — M1990 Unspecified osteoarthritis, unspecified site: Secondary | ICD-10-CM | POA: Diagnosis not present

## 2018-03-25 DIAGNOSIS — I442 Atrioventricular block, complete: Secondary | ICD-10-CM | POA: Diagnosis not present

## 2018-03-25 DIAGNOSIS — M25561 Pain in right knee: Secondary | ICD-10-CM | POA: Diagnosis not present

## 2018-03-25 DIAGNOSIS — G20 Parkinson's disease: Secondary | ICD-10-CM | POA: Diagnosis not present

## 2018-03-25 DIAGNOSIS — R1319 Other dysphagia: Secondary | ICD-10-CM | POA: Diagnosis not present

## 2018-03-25 DIAGNOSIS — R1310 Dysphagia, unspecified: Secondary | ICD-10-CM | POA: Diagnosis not present

## 2018-03-25 DIAGNOSIS — I482 Chronic atrial fibrillation, unspecified: Secondary | ICD-10-CM | POA: Diagnosis not present

## 2018-03-26 DIAGNOSIS — J690 Pneumonitis due to inhalation of food and vomit: Secondary | ICD-10-CM | POA: Diagnosis not present

## 2018-03-26 DIAGNOSIS — G20 Parkinson's disease: Secondary | ICD-10-CM | POA: Diagnosis not present

## 2018-03-26 DIAGNOSIS — I482 Chronic atrial fibrillation, unspecified: Secondary | ICD-10-CM | POA: Diagnosis not present

## 2018-03-26 DIAGNOSIS — R1310 Dysphagia, unspecified: Secondary | ICD-10-CM | POA: Diagnosis not present

## 2018-03-26 DIAGNOSIS — I442 Atrioventricular block, complete: Secondary | ICD-10-CM | POA: Diagnosis not present

## 2018-03-26 DIAGNOSIS — M25561 Pain in right knee: Secondary | ICD-10-CM | POA: Diagnosis not present

## 2018-03-26 DIAGNOSIS — N183 Chronic kidney disease, stage 3 (moderate): Secondary | ICD-10-CM | POA: Diagnosis not present

## 2018-03-26 DIAGNOSIS — M1990 Unspecified osteoarthritis, unspecified site: Secondary | ICD-10-CM | POA: Diagnosis not present

## 2018-03-26 DIAGNOSIS — I129 Hypertensive chronic kidney disease with stage 1 through stage 4 chronic kidney disease, or unspecified chronic kidney disease: Secondary | ICD-10-CM | POA: Diagnosis not present

## 2018-03-27 DIAGNOSIS — M25561 Pain in right knee: Secondary | ICD-10-CM | POA: Diagnosis not present

## 2018-03-27 DIAGNOSIS — J189 Pneumonia, unspecified organism: Secondary | ICD-10-CM | POA: Diagnosis not present

## 2018-03-30 DIAGNOSIS — I129 Hypertensive chronic kidney disease with stage 1 through stage 4 chronic kidney disease, or unspecified chronic kidney disease: Secondary | ICD-10-CM | POA: Diagnosis not present

## 2018-03-30 DIAGNOSIS — R5381 Other malaise: Secondary | ICD-10-CM | POA: Diagnosis not present

## 2018-03-30 DIAGNOSIS — G20 Parkinson's disease: Secondary | ICD-10-CM | POA: Diagnosis not present

## 2018-03-30 DIAGNOSIS — M1711 Unilateral primary osteoarthritis, right knee: Secondary | ICD-10-CM | POA: Diagnosis not present

## 2018-03-30 DIAGNOSIS — R1310 Dysphagia, unspecified: Secondary | ICD-10-CM | POA: Diagnosis not present

## 2018-03-30 DIAGNOSIS — N179 Acute kidney failure, unspecified: Secondary | ICD-10-CM | POA: Diagnosis not present

## 2018-03-30 DIAGNOSIS — I482 Chronic atrial fibrillation, unspecified: Secondary | ICD-10-CM | POA: Diagnosis not present

## 2018-03-30 DIAGNOSIS — M25561 Pain in right knee: Secondary | ICD-10-CM | POA: Diagnosis not present

## 2018-03-30 DIAGNOSIS — M1990 Unspecified osteoarthritis, unspecified site: Secondary | ICD-10-CM | POA: Diagnosis not present

## 2018-03-30 DIAGNOSIS — D649 Anemia, unspecified: Secondary | ICD-10-CM | POA: Diagnosis not present

## 2018-03-30 DIAGNOSIS — J690 Pneumonitis due to inhalation of food and vomit: Secondary | ICD-10-CM | POA: Diagnosis not present

## 2018-03-30 DIAGNOSIS — R55 Syncope and collapse: Secondary | ICD-10-CM | POA: Diagnosis not present

## 2018-03-30 DIAGNOSIS — I10 Essential (primary) hypertension: Secondary | ICD-10-CM | POA: Diagnosis not present

## 2018-03-30 DIAGNOSIS — I442 Atrioventricular block, complete: Secondary | ICD-10-CM | POA: Diagnosis not present

## 2018-03-30 DIAGNOSIS — N183 Chronic kidney disease, stage 3 (moderate): Secondary | ICD-10-CM | POA: Diagnosis not present

## 2018-04-01 DIAGNOSIS — I442 Atrioventricular block, complete: Secondary | ICD-10-CM | POA: Diagnosis not present

## 2018-04-01 DIAGNOSIS — M1990 Unspecified osteoarthritis, unspecified site: Secondary | ICD-10-CM | POA: Diagnosis not present

## 2018-04-01 DIAGNOSIS — R1310 Dysphagia, unspecified: Secondary | ICD-10-CM | POA: Diagnosis not present

## 2018-04-01 DIAGNOSIS — N183 Chronic kidney disease, stage 3 (moderate): Secondary | ICD-10-CM | POA: Diagnosis not present

## 2018-04-01 DIAGNOSIS — I482 Chronic atrial fibrillation, unspecified: Secondary | ICD-10-CM | POA: Diagnosis not present

## 2018-04-01 DIAGNOSIS — J690 Pneumonitis due to inhalation of food and vomit: Secondary | ICD-10-CM | POA: Diagnosis not present

## 2018-04-01 DIAGNOSIS — M25561 Pain in right knee: Secondary | ICD-10-CM | POA: Diagnosis not present

## 2018-04-01 DIAGNOSIS — G20 Parkinson's disease: Secondary | ICD-10-CM | POA: Diagnosis not present

## 2018-04-01 DIAGNOSIS — I129 Hypertensive chronic kidney disease with stage 1 through stage 4 chronic kidney disease, or unspecified chronic kidney disease: Secondary | ICD-10-CM | POA: Diagnosis not present

## 2018-04-02 ENCOUNTER — Ambulatory Visit: Payer: Self-pay | Admitting: Neurology

## 2018-04-03 DIAGNOSIS — M25561 Pain in right knee: Secondary | ICD-10-CM | POA: Diagnosis not present

## 2018-04-03 DIAGNOSIS — M1990 Unspecified osteoarthritis, unspecified site: Secondary | ICD-10-CM | POA: Diagnosis not present

## 2018-04-03 DIAGNOSIS — I482 Chronic atrial fibrillation, unspecified: Secondary | ICD-10-CM | POA: Diagnosis not present

## 2018-04-03 DIAGNOSIS — I129 Hypertensive chronic kidney disease with stage 1 through stage 4 chronic kidney disease, or unspecified chronic kidney disease: Secondary | ICD-10-CM | POA: Diagnosis not present

## 2018-04-03 DIAGNOSIS — N183 Chronic kidney disease, stage 3 (moderate): Secondary | ICD-10-CM | POA: Diagnosis not present

## 2018-04-03 DIAGNOSIS — J690 Pneumonitis due to inhalation of food and vomit: Secondary | ICD-10-CM | POA: Diagnosis not present

## 2018-04-03 DIAGNOSIS — G20 Parkinson's disease: Secondary | ICD-10-CM | POA: Diagnosis not present

## 2018-04-03 DIAGNOSIS — R1310 Dysphagia, unspecified: Secondary | ICD-10-CM | POA: Diagnosis not present

## 2018-04-03 DIAGNOSIS — I442 Atrioventricular block, complete: Secondary | ICD-10-CM | POA: Diagnosis not present

## 2018-04-06 DIAGNOSIS — M25561 Pain in right knee: Secondary | ICD-10-CM | POA: Diagnosis not present

## 2018-04-06 DIAGNOSIS — I442 Atrioventricular block, complete: Secondary | ICD-10-CM | POA: Diagnosis not present

## 2018-04-06 DIAGNOSIS — J690 Pneumonitis due to inhalation of food and vomit: Secondary | ICD-10-CM | POA: Diagnosis not present

## 2018-04-06 DIAGNOSIS — I482 Chronic atrial fibrillation, unspecified: Secondary | ICD-10-CM | POA: Diagnosis not present

## 2018-04-06 DIAGNOSIS — I129 Hypertensive chronic kidney disease with stage 1 through stage 4 chronic kidney disease, or unspecified chronic kidney disease: Secondary | ICD-10-CM | POA: Diagnosis not present

## 2018-04-06 DIAGNOSIS — G20 Parkinson's disease: Secondary | ICD-10-CM | POA: Diagnosis not present

## 2018-04-06 DIAGNOSIS — M1990 Unspecified osteoarthritis, unspecified site: Secondary | ICD-10-CM | POA: Diagnosis not present

## 2018-04-06 DIAGNOSIS — N183 Chronic kidney disease, stage 3 (moderate): Secondary | ICD-10-CM | POA: Diagnosis not present

## 2018-04-06 DIAGNOSIS — R1310 Dysphagia, unspecified: Secondary | ICD-10-CM | POA: Diagnosis not present

## 2018-04-08 DIAGNOSIS — R000 Tachycardia, unspecified: Secondary | ICD-10-CM | POA: Diagnosis not present

## 2018-04-08 DIAGNOSIS — R0689 Other abnormalities of breathing: Secondary | ICD-10-CM | POA: Diagnosis not present

## 2018-04-08 DIAGNOSIS — R404 Transient alteration of awareness: Secondary | ICD-10-CM | POA: Diagnosis not present

## 2018-04-08 DIAGNOSIS — R4020 Unspecified coma: Secondary | ICD-10-CM | POA: Diagnosis not present

## 2018-04-22 ENCOUNTER — Telehealth: Payer: Self-pay | Admitting: Neurology

## 2018-04-23 ENCOUNTER — Ambulatory Visit: Payer: Self-pay | Admitting: Neurology

## 2018-04-26 DIAGNOSIS — 419620001 Death: Secondary | SNOMED CT | POA: Diagnosis not present

## 2018-04-26 NOTE — Telephone Encounter (Signed)
Pt's daughter called to inform us that her mother has passed on.

## 2018-04-26 DEATH — deceased

## 2018-07-23 ENCOUNTER — Ambulatory Visit: Payer: Medicare PPO | Admitting: Neurology

## 2019-05-05 IMAGING — DX DG CHEST 2V
2 series · 3 of 3 positions shown · non-contrast
Comparison: 01/30/2018

CLINICAL DATA: Weakness

EXAM:
CHEST - 2 VIEW

[Series 2: chest lat · 0.14mm/px · 2 of 2 slices shown]
[im 1/2]
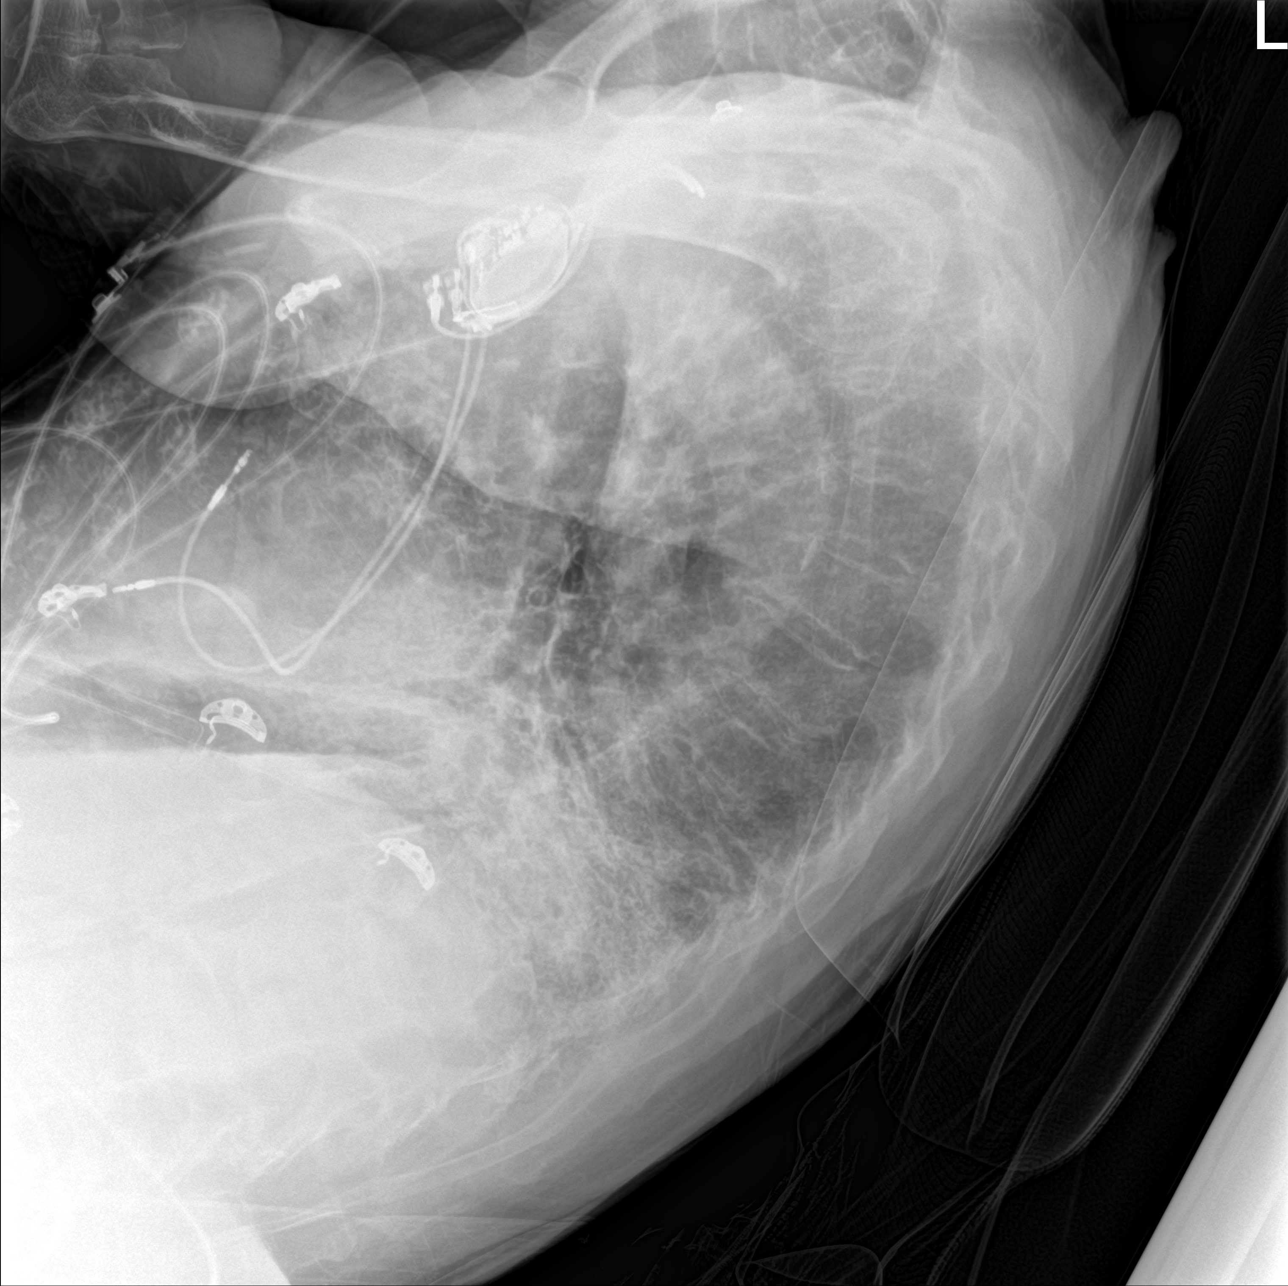
[im 2/2]
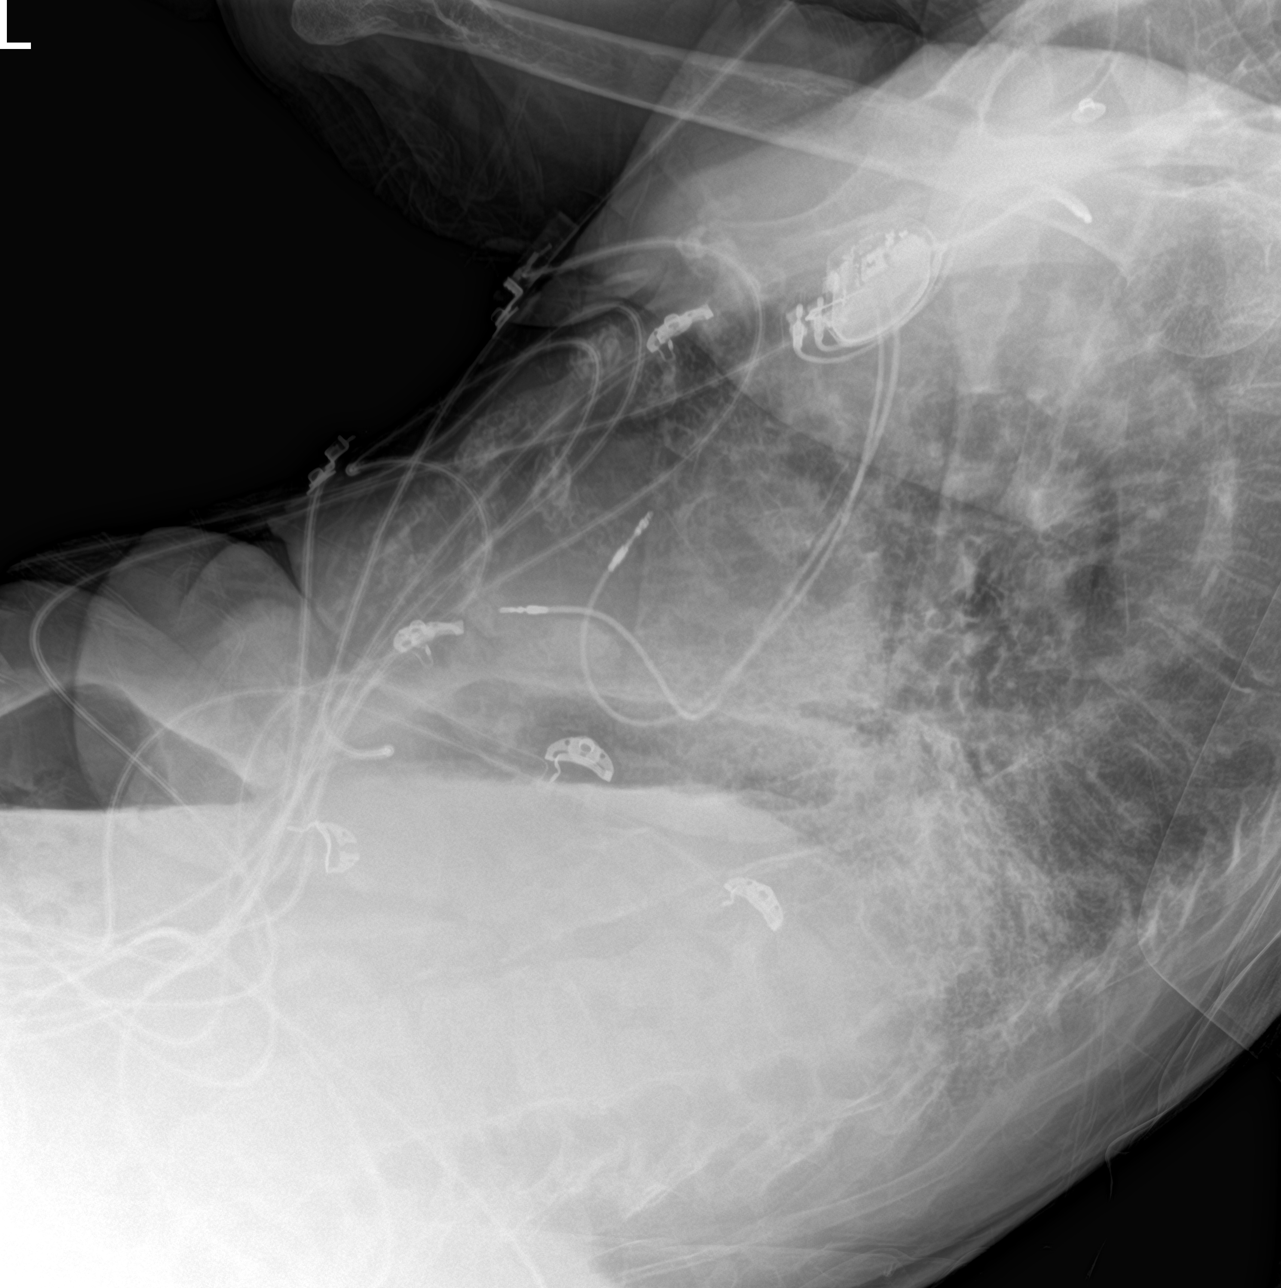

[chest ap]
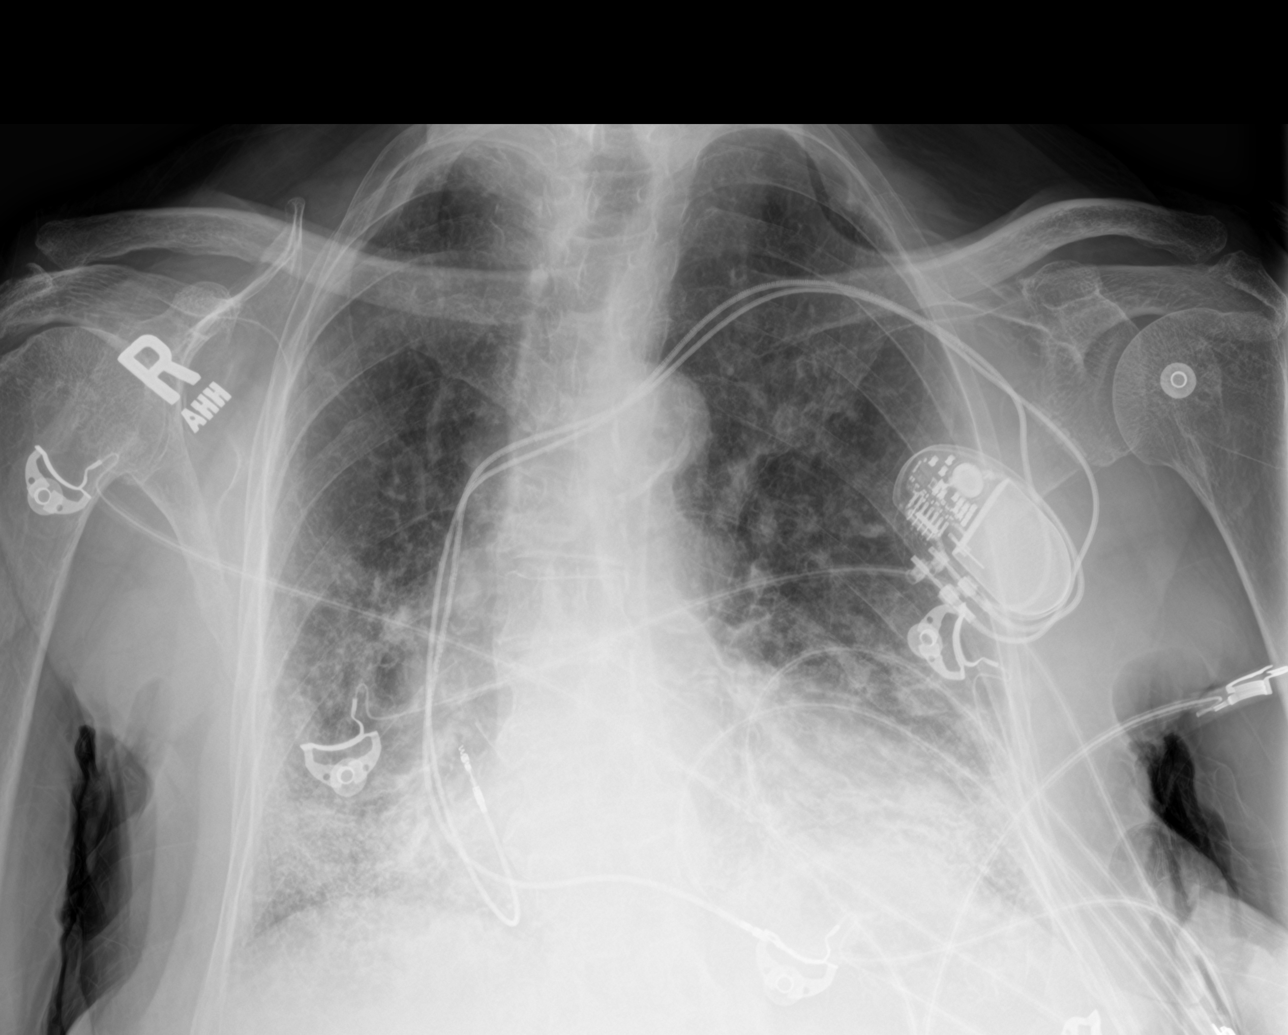

[3 of 3 positions shown; findings below may reference images not displayed]

FINDINGS: Bibasilar airspace disease is unchanged. Based on prior studies,
there is chronic scarring in the lung bases however this has
progressed from prior studies and there could be superimposed
pneumonia.

Negative for heart failure or effusion.  Pacemaker unchanged.
IMPRESSION: Bibasilar airspace disease unchanged. Possible superimposed
pneumonia on top of chronic bibasilar fibrosis.

## 2019-06-12 IMAGING — DX DG CHEST 1V PORT
1 series · 1 of 1 positions shown · non-contrast
Comparison: Frontal and lateral views 02/13/2018, additional prior
exams.

CLINICAL DATA: Cough. Weakness.

EXAM:
PORTABLE CHEST 1 VIEW

[chest ap]
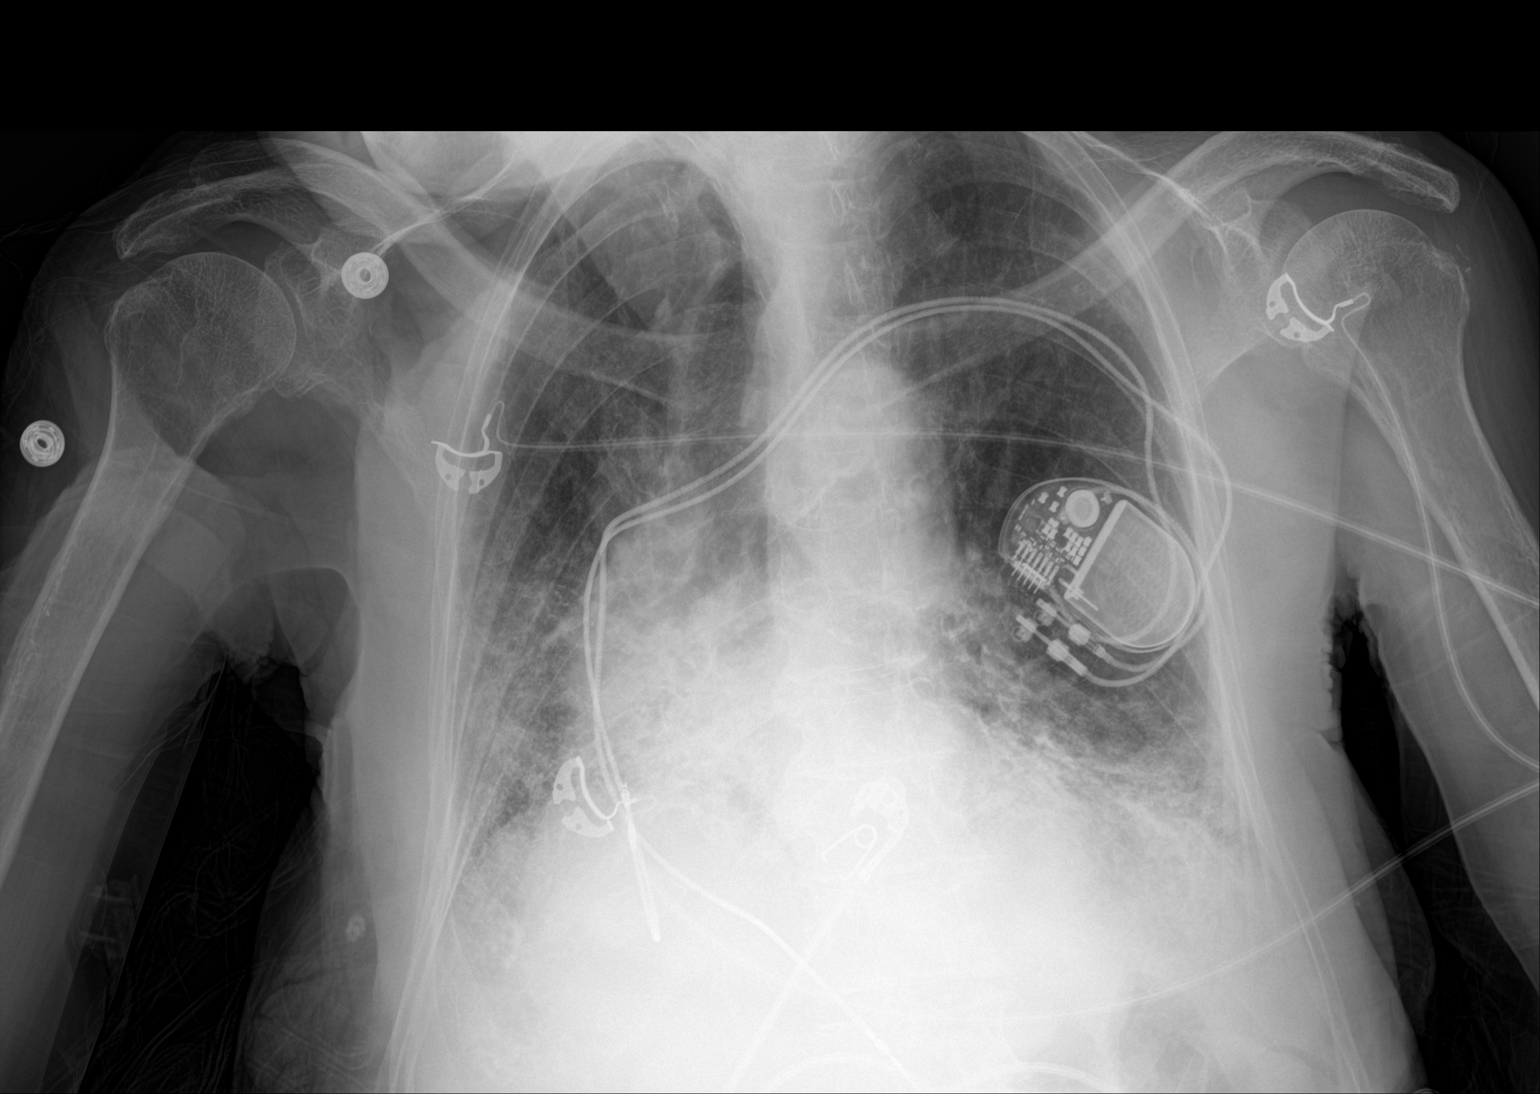

[1 of 1 positions shown; findings below may reference images not displayed]

FINDINGS: Left-sided pacemaker in place. Cardiomegaly unchanged from prior.
Unchanged mediastinal contours. Interstitial opacities in the mid to
lower lung zone predominance, similar to exams last month. Abdominal
CT 01/30/2017 demonstrated bronchiectasis and ground-glass
opacities. Demonstrated no evidence of new airspace disease or
pulmonary edema. Difficult to assess for pleural effusions. No
pneumothorax. The bones are under mineralized.
IMPRESSION: 1. Mid and lower lung zone interstitial opacities corresponding to
bronchiectasis and interstitial lung disease. This is unchanged over
the last month, and may have progressed from 3392 radiograph.
2. Stable cardiomegaly.
